# Patient Record
Sex: Female | Born: 1960 | Race: White | Hispanic: No | Marital: Married | Smoking: Current every day smoker
Health system: Southern US, Community
[De-identification: ages and names within clinical notes are randomized; demographics above are authoritative.]

## PROBLEM LIST (undated history)

## (undated) DIAGNOSIS — J45909 Unspecified asthma, uncomplicated: Secondary | ICD-10-CM

## (undated) DIAGNOSIS — J189 Pneumonia, unspecified organism: Secondary | ICD-10-CM

## (undated) DIAGNOSIS — R519 Headache, unspecified: Secondary | ICD-10-CM

## (undated) DIAGNOSIS — M549 Dorsalgia, unspecified: Secondary | ICD-10-CM

## (undated) DIAGNOSIS — E119 Type 2 diabetes mellitus without complications: Secondary | ICD-10-CM

## (undated) DIAGNOSIS — F32A Depression, unspecified: Secondary | ICD-10-CM

## (undated) DIAGNOSIS — G8929 Other chronic pain: Secondary | ICD-10-CM

## (undated) DIAGNOSIS — I219 Acute myocardial infarction, unspecified: Secondary | ICD-10-CM

## (undated) DIAGNOSIS — Z9889 Other specified postprocedural states: Secondary | ICD-10-CM

## (undated) DIAGNOSIS — R0600 Dyspnea, unspecified: Secondary | ICD-10-CM

## (undated) DIAGNOSIS — J449 Chronic obstructive pulmonary disease, unspecified: Secondary | ICD-10-CM

## (undated) DIAGNOSIS — F419 Anxiety disorder, unspecified: Secondary | ICD-10-CM

## (undated) DIAGNOSIS — F329 Major depressive disorder, single episode, unspecified: Secondary | ICD-10-CM

## (undated) DIAGNOSIS — E785 Hyperlipidemia, unspecified: Secondary | ICD-10-CM

## (undated) DIAGNOSIS — R112 Nausea with vomiting, unspecified: Secondary | ICD-10-CM

## (undated) DIAGNOSIS — I2510 Atherosclerotic heart disease of native coronary artery without angina pectoris: Secondary | ICD-10-CM

## (undated) DIAGNOSIS — I10 Essential (primary) hypertension: Secondary | ICD-10-CM

## (undated) HISTORY — DX: Essential (primary) hypertension: I10

## (undated) HISTORY — DX: Atherosclerotic heart disease of native coronary artery without angina pectoris: I25.10

## (undated) HISTORY — DX: Chronic obstructive pulmonary disease, unspecified: J44.9

## (undated) HISTORY — DX: Anxiety disorder, unspecified: F41.9

## (undated) HISTORY — DX: Hyperlipidemia, unspecified: E78.5

## (undated) HISTORY — PX: BREAST BIOPSY: SHX20

## (undated) HISTORY — DX: Depression, unspecified: F32.A

## (undated) HISTORY — PX: TOTAL VAGINAL HYSTERECTOMY: SHX2548

## (undated) HISTORY — DX: Other chronic pain: G89.29

## (undated) HISTORY — DX: Major depressive disorder, single episode, unspecified: F32.9

## (undated) HISTORY — DX: Type 2 diabetes mellitus without complications: E11.9

## (undated) HISTORY — DX: Dorsalgia, unspecified: M54.9

---

## 2001-05-11 ENCOUNTER — Emergency Department (HOSPITAL_COMMUNITY): Admission: EM | Admit: 2001-05-11 | Discharge: 2001-05-11 | Payer: Self-pay | Admitting: Emergency Medicine

## 2001-05-11 ENCOUNTER — Encounter: Payer: Self-pay | Admitting: Emergency Medicine

## 2001-12-16 ENCOUNTER — Ambulatory Visit (HOSPITAL_COMMUNITY): Admission: RE | Admit: 2001-12-16 | Discharge: 2001-12-16 | Payer: Self-pay | Admitting: Pulmonary Disease

## 2002-07-05 ENCOUNTER — Other Ambulatory Visit: Admission: RE | Admit: 2002-07-05 | Discharge: 2002-07-05 | Payer: Self-pay | Admitting: Obstetrics & Gynecology

## 2002-08-11 ENCOUNTER — Observation Stay (HOSPITAL_COMMUNITY): Admission: RE | Admit: 2002-08-11 | Discharge: 2002-08-12 | Payer: Self-pay | Admitting: Obstetrics & Gynecology

## 2003-11-29 ENCOUNTER — Ambulatory Visit (HOSPITAL_COMMUNITY): Admission: RE | Admit: 2003-11-29 | Discharge: 2003-11-29 | Payer: Self-pay | Admitting: Obstetrics & Gynecology

## 2004-01-24 ENCOUNTER — Ambulatory Visit (HOSPITAL_COMMUNITY): Admission: RE | Admit: 2004-01-24 | Discharge: 2004-01-24 | Payer: Self-pay | Admitting: Obstetrics & Gynecology

## 2004-04-03 ENCOUNTER — Ambulatory Visit (HOSPITAL_COMMUNITY): Admission: RE | Admit: 2004-04-03 | Discharge: 2004-04-03 | Payer: Self-pay | Admitting: Pulmonary Disease

## 2004-04-25 ENCOUNTER — Encounter (HOSPITAL_COMMUNITY): Admission: RE | Admit: 2004-04-25 | Discharge: 2004-04-26 | Payer: Self-pay

## 2006-04-21 ENCOUNTER — Ambulatory Visit (HOSPITAL_COMMUNITY): Admission: RE | Admit: 2006-04-21 | Discharge: 2006-04-21 | Payer: Self-pay | Admitting: Pulmonary Disease

## 2006-05-01 ENCOUNTER — Ambulatory Visit: Payer: Self-pay | Admitting: Internal Medicine

## 2007-09-27 ENCOUNTER — Ambulatory Visit (HOSPITAL_COMMUNITY): Admission: RE | Admit: 2007-09-27 | Discharge: 2007-09-27 | Payer: Self-pay | Admitting: Pulmonary Disease

## 2008-12-21 ENCOUNTER — Ambulatory Visit (HOSPITAL_COMMUNITY): Payer: Self-pay | Admitting: Psychiatry

## 2009-01-04 ENCOUNTER — Ambulatory Visit (HOSPITAL_COMMUNITY): Payer: Self-pay | Admitting: Psychiatry

## 2009-01-11 ENCOUNTER — Ambulatory Visit (HOSPITAL_COMMUNITY): Payer: Self-pay | Admitting: Psychiatry

## 2009-01-16 ENCOUNTER — Ambulatory Visit (HOSPITAL_COMMUNITY): Payer: Self-pay | Admitting: Psychiatry

## 2009-01-18 ENCOUNTER — Ambulatory Visit (HOSPITAL_COMMUNITY): Payer: Self-pay | Admitting: Psychiatry

## 2009-01-23 ENCOUNTER — Ambulatory Visit (HOSPITAL_COMMUNITY): Payer: Self-pay | Admitting: Psychiatry

## 2009-01-30 ENCOUNTER — Ambulatory Visit (HOSPITAL_COMMUNITY): Payer: Self-pay | Admitting: Psychiatry

## 2009-02-07 ENCOUNTER — Ambulatory Visit (HOSPITAL_COMMUNITY): Payer: Self-pay | Admitting: Psychiatry

## 2009-02-12 ENCOUNTER — Ambulatory Visit (HOSPITAL_COMMUNITY): Payer: Self-pay | Admitting: Psychiatry

## 2009-02-15 ENCOUNTER — Ambulatory Visit (HOSPITAL_COMMUNITY): Payer: Self-pay | Admitting: Psychiatry

## 2009-02-22 ENCOUNTER — Ambulatory Visit (HOSPITAL_COMMUNITY): Payer: Self-pay | Admitting: Psychiatry

## 2009-03-01 ENCOUNTER — Ambulatory Visit (HOSPITAL_COMMUNITY): Payer: Self-pay | Admitting: Psychiatry

## 2009-03-16 ENCOUNTER — Ambulatory Visit (HOSPITAL_COMMUNITY): Payer: Self-pay | Admitting: Psychiatry

## 2009-03-20 ENCOUNTER — Ambulatory Visit (HOSPITAL_COMMUNITY): Payer: Self-pay | Admitting: Psychiatry

## 2009-03-30 ENCOUNTER — Ambulatory Visit (HOSPITAL_COMMUNITY): Payer: Self-pay | Admitting: Psychiatry

## 2009-04-13 ENCOUNTER — Ambulatory Visit (HOSPITAL_COMMUNITY): Payer: Self-pay | Admitting: Psychiatry

## 2009-04-24 ENCOUNTER — Ambulatory Visit (HOSPITAL_COMMUNITY): Payer: Self-pay | Admitting: Psychiatry

## 2009-05-01 ENCOUNTER — Ambulatory Visit (HOSPITAL_COMMUNITY): Payer: Self-pay | Admitting: Psychiatry

## 2009-05-15 ENCOUNTER — Ambulatory Visit (HOSPITAL_COMMUNITY): Payer: Self-pay | Admitting: Psychiatry

## 2009-05-22 ENCOUNTER — Ambulatory Visit (HOSPITAL_COMMUNITY): Payer: Self-pay | Admitting: Psychiatry

## 2009-06-08 ENCOUNTER — Ambulatory Visit (HOSPITAL_COMMUNITY): Payer: Self-pay | Admitting: Psychiatry

## 2009-06-18 ENCOUNTER — Ambulatory Visit (HOSPITAL_COMMUNITY): Payer: Self-pay | Admitting: Psychiatry

## 2009-07-12 ENCOUNTER — Ambulatory Visit (HOSPITAL_COMMUNITY): Payer: Self-pay | Admitting: Psychiatry

## 2009-07-13 ENCOUNTER — Ambulatory Visit (HOSPITAL_COMMUNITY): Payer: Self-pay | Admitting: Psychiatry

## 2009-07-18 ENCOUNTER — Ambulatory Visit (HOSPITAL_COMMUNITY): Payer: Self-pay | Admitting: Psychiatry

## 2009-08-16 ENCOUNTER — Ambulatory Visit (HOSPITAL_COMMUNITY): Payer: Self-pay | Admitting: Psychiatry

## 2009-08-23 ENCOUNTER — Ambulatory Visit (HOSPITAL_COMMUNITY): Payer: Self-pay | Admitting: Psychiatry

## 2009-09-05 ENCOUNTER — Ambulatory Visit (HOSPITAL_COMMUNITY): Payer: Self-pay | Admitting: Psychiatry

## 2009-10-04 ENCOUNTER — Ambulatory Visit (HOSPITAL_COMMUNITY): Payer: Self-pay | Admitting: Psychiatry

## 2009-10-22 ENCOUNTER — Ambulatory Visit (HOSPITAL_COMMUNITY): Payer: Self-pay | Admitting: Psychiatry

## 2009-10-30 ENCOUNTER — Ambulatory Visit (HOSPITAL_COMMUNITY): Payer: Self-pay | Admitting: Psychiatry

## 2009-11-22 ENCOUNTER — Ambulatory Visit (HOSPITAL_COMMUNITY): Payer: Self-pay | Admitting: Psychiatry

## 2009-12-14 ENCOUNTER — Ambulatory Visit (HOSPITAL_COMMUNITY): Payer: Self-pay | Admitting: Psychiatry

## 2009-12-25 ENCOUNTER — Ambulatory Visit (HOSPITAL_COMMUNITY): Payer: Self-pay | Admitting: Psychiatry

## 2009-12-28 ENCOUNTER — Ambulatory Visit (HOSPITAL_COMMUNITY): Payer: Self-pay | Admitting: Psychiatry

## 2010-01-15 ENCOUNTER — Ambulatory Visit (HOSPITAL_COMMUNITY): Payer: Self-pay | Admitting: Psychiatry

## 2010-01-22 ENCOUNTER — Ambulatory Visit (HOSPITAL_COMMUNITY): Payer: Self-pay | Admitting: Psychiatry

## 2010-01-30 ENCOUNTER — Ambulatory Visit (HOSPITAL_COMMUNITY): Payer: Self-pay | Admitting: Psychiatry

## 2010-02-05 ENCOUNTER — Ambulatory Visit (HOSPITAL_COMMUNITY): Payer: Self-pay | Admitting: Psychiatry

## 2010-02-19 ENCOUNTER — Ambulatory Visit (HOSPITAL_COMMUNITY): Payer: Self-pay | Admitting: Psychiatry

## 2010-02-22 ENCOUNTER — Ambulatory Visit (HOSPITAL_COMMUNITY): Payer: Self-pay | Admitting: Psychiatry

## 2010-03-13 ENCOUNTER — Ambulatory Visit (HOSPITAL_COMMUNITY): Payer: Self-pay | Admitting: Psychiatry

## 2010-03-21 ENCOUNTER — Ambulatory Visit (HOSPITAL_COMMUNITY): Payer: Self-pay | Admitting: Psychiatry

## 2010-03-27 ENCOUNTER — Ambulatory Visit (HOSPITAL_COMMUNITY): Payer: Self-pay | Admitting: Psychiatry

## 2010-04-02 ENCOUNTER — Ambulatory Visit (HOSPITAL_COMMUNITY): Payer: Self-pay | Admitting: Psychiatry

## 2010-04-15 ENCOUNTER — Ambulatory Visit (HOSPITAL_COMMUNITY): Payer: Self-pay | Admitting: Psychiatry

## 2010-04-29 ENCOUNTER — Ambulatory Visit (HOSPITAL_COMMUNITY): Payer: Self-pay | Admitting: Psychiatry

## 2010-05-14 ENCOUNTER — Ambulatory Visit (HOSPITAL_COMMUNITY): Payer: Self-pay | Admitting: Psychiatry

## 2010-05-21 ENCOUNTER — Ambulatory Visit (HOSPITAL_COMMUNITY): Payer: Self-pay | Admitting: Psychiatry

## 2010-06-07 ENCOUNTER — Ambulatory Visit (HOSPITAL_COMMUNITY): Payer: Self-pay | Admitting: Psychiatry

## 2010-06-19 ENCOUNTER — Ambulatory Visit (HOSPITAL_COMMUNITY): Payer: Self-pay | Admitting: Psychiatry

## 2010-07-05 ENCOUNTER — Ambulatory Visit (HOSPITAL_COMMUNITY): Payer: Self-pay | Admitting: Psychiatry

## 2010-07-11 ENCOUNTER — Ambulatory Visit (HOSPITAL_COMMUNITY): Payer: Self-pay | Admitting: Psychiatry

## 2010-07-16 ENCOUNTER — Ambulatory Visit (HOSPITAL_COMMUNITY): Payer: Self-pay | Admitting: Psychiatry

## 2010-08-07 ENCOUNTER — Ambulatory Visit (HOSPITAL_COMMUNITY)
Admission: RE | Admit: 2010-08-07 | Discharge: 2010-08-07 | Payer: Self-pay | Source: Home / Self Care | Attending: Psychiatry | Admitting: Psychiatry

## 2010-08-24 ENCOUNTER — Encounter: Payer: Self-pay | Admitting: Internal Medicine

## 2010-08-25 ENCOUNTER — Encounter: Payer: Self-pay | Admitting: Pulmonary Disease

## 2010-08-28 ENCOUNTER — Ambulatory Visit (HOSPITAL_COMMUNITY)
Admission: RE | Admit: 2010-08-28 | Discharge: 2010-08-28 | Payer: Self-pay | Source: Home / Self Care | Attending: Psychiatry | Admitting: Psychiatry

## 2010-09-13 ENCOUNTER — Encounter (INDEPENDENT_AMBULATORY_CARE_PROVIDER_SITE_OTHER): Payer: Self-pay | Admitting: Psychiatry

## 2010-09-13 DIAGNOSIS — F329 Major depressive disorder, single episode, unspecified: Secondary | ICD-10-CM

## 2010-09-17 ENCOUNTER — Encounter (INDEPENDENT_AMBULATORY_CARE_PROVIDER_SITE_OTHER): Payer: Self-pay | Admitting: Psychiatry

## 2010-09-17 DIAGNOSIS — F331 Major depressive disorder, recurrent, moderate: Secondary | ICD-10-CM

## 2010-09-17 DIAGNOSIS — F3289 Other specified depressive episodes: Secondary | ICD-10-CM

## 2010-09-17 DIAGNOSIS — F329 Major depressive disorder, single episode, unspecified: Secondary | ICD-10-CM

## 2010-09-26 ENCOUNTER — Encounter (INDEPENDENT_AMBULATORY_CARE_PROVIDER_SITE_OTHER): Payer: Self-pay | Admitting: Psychiatry

## 2010-09-26 DIAGNOSIS — F329 Major depressive disorder, single episode, unspecified: Secondary | ICD-10-CM

## 2010-10-11 ENCOUNTER — Encounter (INDEPENDENT_AMBULATORY_CARE_PROVIDER_SITE_OTHER): Payer: Self-pay | Admitting: Psychiatry

## 2010-10-11 DIAGNOSIS — F329 Major depressive disorder, single episode, unspecified: Secondary | ICD-10-CM

## 2010-10-24 ENCOUNTER — Encounter (INDEPENDENT_AMBULATORY_CARE_PROVIDER_SITE_OTHER): Payer: Self-pay | Admitting: Psychiatry

## 2010-10-24 DIAGNOSIS — F329 Major depressive disorder, single episode, unspecified: Secondary | ICD-10-CM

## 2010-11-12 ENCOUNTER — Encounter (INDEPENDENT_AMBULATORY_CARE_PROVIDER_SITE_OTHER): Payer: Self-pay | Admitting: Psychiatry

## 2010-11-12 DIAGNOSIS — F329 Major depressive disorder, single episode, unspecified: Secondary | ICD-10-CM

## 2010-11-19 ENCOUNTER — Encounter (INDEPENDENT_AMBULATORY_CARE_PROVIDER_SITE_OTHER): Payer: Self-pay | Admitting: Psychiatry

## 2010-11-19 DIAGNOSIS — F331 Major depressive disorder, recurrent, moderate: Secondary | ICD-10-CM

## 2010-12-03 ENCOUNTER — Encounter (INDEPENDENT_AMBULATORY_CARE_PROVIDER_SITE_OTHER): Payer: Self-pay | Admitting: Psychiatry

## 2010-12-03 DIAGNOSIS — F329 Major depressive disorder, single episode, unspecified: Secondary | ICD-10-CM

## 2010-12-20 NOTE — Op Note (Signed)
NAME:  Lauren David, Lauren David                          ACCOUNT NO.:  [id number]   MEDICAL RECORD NO.:  [id number]                   PATIENT TYPE:  AMB   LOCATION:  DAY                                  FACILITY:  [facility]   PHYSICIAN:  Lazaro Arms, M.D.                DATE OF BIRTH:  July 13, 1961   DATE OF PROCEDURE:  08/11/2002  DATE OF DISCHARGE:                                 OPERATIVE REPORT   PREOPERATIVE DIAGNOSES:  1. Hypermenorrhea.  2. Endometrial hyperplasia.  3. Dysmenorrhea.  4. Left ovarian cyst.   POSTOPERATIVE DIAGNOSES:  1. Hypermenorrhea.  2. Endometrial hyperplasia.  3. Dysmenorrhea.  4. Left ovarian cyst.   PROCEDURES:  1. Total vaginal hysterectomy, bilateral salpingo-oophorectomy.  2. Removal of skin tags of the neck.   SURGEON:  Lazaro Arms, M.D.   ANESTHESIA:  General endotracheal.   FINDINGS:  The patient had extremely good support structures of the uterus.  She had no descent at all.  The bladder was well-supported, as was the  bladder neck.  The left ovary did indeed have a cyst, which was ruptured at  the time of removal.  It was overall enlarged as compared to the right.  Both tubes and ovaries appeared to be normal.  The uterus appeared to be  normal.   DESCRIPTION OF PROCEDURE:  The patient was taken to the operating room and  placed in the supine position, where she underwent general endotracheal  anesthesia.  She was then placed in dorsal lithotomy position and prepped  and draped in the usual sterile fashion.  A Foley catheter was placed.  A  weighted speculum was placed in the posterior vagina, and the cervix was  grasped with a thyroid tenaculum.  A Deaver was used to retract the bladder.  Marcaine 0.5% with 1:200,000 epinephrine was injected in a circumferential  fashion about the cervix.  The electrocautery unit was used to incise the  vagina, and it was pushed off the lower uterine segment both anteriorly and  posteriorly.  The posterior  cul-de-sac was entered with some difficulty  because again it was very high and there was no descent of the uterus.  The  uterosacral ligaments were clamped, cut, transfixed, and suture ligated and  held bilaterally.  The cardinal ligaments were clamped, cut, transfixed, and  suture ligated and cut.  It took several small pedicles to through the  cardinal.  Great care was taken to dissect the bladder peritoneum off the  lower uterine segment.  Initially I was too deep in the dissection and then  was able to locate the peritoneum and incised it.  I then plicated the  anterior and posterior leaves of the broad ligament and clamped the vessels.  These were clamped, cut, and suture ligated.  Serial pedicles taken up the  fundus, each pedicle being clamped, cut, and transfixed and suture ligated.  The utero-ovarian  ligament was crossclamped and double suture ligated  bilaterally.  Large hemoclips were used to prevent peritoneal slippage.  The  left infundibulopelvic ligament was then pulled down.  It was quite large,  and I could not get around it with one clamp.  As a result, I clamped it  from above and below and it had some peritoneal slippage.  I used the  hemoclips,  suture ligation was performed in a fore-and-aft fashion, but  there was some area in the middle that was bleeding and I used hemoclips and  additional suturing to get it to stop.  It was hemostatic and held.  The  infundibulopelvic ligament on the right was crossclamped, cut, and suture  ligated with good hemostasis.  The pelvis was irrigated vigorously.  All  pedicles were found to be hemostatic.  Because of difficulty dissecting the  bladder, I did put 420 cubic centimeters of sterile milk in the bladder, and  there was no leakage.  The peritoneum was closed in a pursestring fashion.  The vagina was closed anterior to posterior from side to side without  difficulty.  There was a uterosacral plication performed.  There was   extremely good support of the bladder neck, the bladder, and the vaginal  apex at this point.  I then removed skin tags from the neck sharply with  scissors.  Silver nitrate was placed in these areas.  The patient tolerated  the procedure well.  She experienced 100 cubic centimeters of blood loss.  She was taken to recovery in good, stable condition.  All counts were  correct x3.  She received Levaquin prophylactically.                                                Lazaro Arms, M.D.    [name]  D:  08/11/2002  T:  08/11/2002  Job:  [id number]

## 2010-12-20 NOTE — Procedures (Signed)
Lauren David, Lauren David                ACCOUNT NO.:  [id number]   MEDICAL RECORD NO.:  [id number]          PATIENT TYPE:  OUT   LOCATION:  RAD                           FACILITY:  [facility]   PHYSICIAN:  Edward L. Juanetta Gosling, M.D.DATE OF BIRTH:  07/19/1961   DATE OF PROCEDURE:  DATE OF DISCHARGE:  04/03/2004                                    STRESS TEST   INDICATIONS FOR PROCEDURE:  Chest discomfort.   SUBJECTIVE:  [name] has been having chest discomfort and also has some  hoarseness.  She has shortness of breath and edema.  Concern is to rule out  ischemic cardiac disease.  There are no contraindications to cardiac stress  testing.   [name] exercised for 6 minutes and 20 seconds on the Bruce protocol  reaching and sustaining 7 mets.  Her maximum recorded heart rate was 155,  which is 88% of her age predicted maximal heart rate.  Her blood pressure  response to exercise was normal.  Cardiolite was injected per routine.  She  had no chest discomfort with exercise, but did have shortness of breath.  There were no electrocardiographic changes suggestive of inducible ischemia.   IMPRESSION:  1.  No evidence of inducible ischemia.  2.  Normal blood pressure response to exercise.  3.  Shortness of breath, but no chest discomfort with exercise.  4.  Good exercise tolerance.  5.  Cardiolite images are pending.      ELH/MEDQ  D:  04/25/2004  T:  04/25/2004  Job:  [id number]

## 2010-12-20 NOTE — H&P (Signed)
   NAME:  Lauren David, Lauren David                          ACCOUNT NO.:  [id number]   MEDICAL RECORD NO.:  [id number]                   PATIENT TYPE:  AMB   LOCATION:  DAY                                  FACILITY:  [facility]   PHYSICIAN:  Lazaro Arms, M.D.                DATE OF BIRTH:  09/03/1960   DATE OF ADMISSION:  DATE OF DISCHARGE:                                HISTORY & PHYSICAL   HISTORY OF PRESENT ILLNESS:  The patient is a 50 year old white female,  gravida 3, para 2, abortus 1 with last menstrual period in early December.  Pretty much stopped her periods using Megace therapy. She was bleeding since  9/19, and she bleed every day since then, variable amounts, sometimes heavy,  sometimes spotting, with lots of clotting bright red-size of plums. Her  cramping is a normal amount. She was having to wear tampons and pads  together. Had endometrial biopsy which showed endometrial hyperplasia. Since  then, I have kept her on Megace. She has also had recurrent ovarian cysts,  the most recent ones on the left side and is about 4 cm. Due to her long  history of heavy irregular bleeding, endometrial hyperplasia, and ovarian  cyst, she is admitted for TVH/BSO.   PAST MEDICAL HISTORY:  Negative.   PAST SURGICAL HISTORY:  Negative.   PAST OBSTETRIC HISTORY:  Vaginal delivery x2 and one termination.   ALLERGIES:  Percocet.   MEDICATIONS:  Xanax as needed and albuterol as needed.   FAMILY HISTORY:  Otherwise negative.   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   PHYSICAL EXAMINATION:  Her blood pressure is 120/80.  HEENT:  Unremarkable.  NECK:  Thyroid is normal.  LUNGS:  Clear.  HEART:  Regular, rate, and rhythm without murmurs, rubs, or gallops.  BREASTS:  Without masses, discharge, or skin changes.  ABDOMEN:  Benign. No hepatosplenomegaly or masses.  GENITALIA:  Has normal external genitalia. __________ without discharge.  Cervix parous without lesions. Uterus normal  size, shape, and contour.  Ovaries normal, nontender. The left ovary is slightly enlarged.  EXTREMITIES:  Warm with no edema.  NEUROLOGICAL:  Grossly intact.   IMPRESSION:  1. Menometorrhagia.  2. Endometrial hyperplasia.  3. Left ovarian cyst.   PLAN:  The patient is admitted for TVH/BSO. She understands the risks,  benefits, indications, and alternatives and will proceed.                                               Lazaro Arms, M.D.    [name]  D:  08/10/2002  T:  08/10/2002  Job:  [id number]

## 2010-12-20 NOTE — Procedures (Signed)
NAME:  Lauren David, Lauren David                          ACCOUNT NO.:  [id number]   MEDICAL RECORD NO.:  [id number]                   PATIENT TYPE:  OUT   LOCATION:  RAD                                  FACILITY:  [facility]   PHYSICIAN:  Hawkeye Bing, M.D.               DATE OF BIRTH:  [birth year]   DATE OF PROCEDURE:  04/03/2004  DATE OF DISCHARGE:                                  ECHOCARDIOGRAM   REFERRING:  Dr. Ramon Dredge L. Hawkins.   CLINICAL DATA:  [age]-year-old woman with dyspnea and chest discomfort.   M-MODE:  Aorta 2.9.  Left atrium 3.9.  Septum 0.8.  Posterior wall 0.8.  LV  diastole 5.2.  LV systole 3.1.   IMPRESSION:  1.  Technically suboptimal but adequate echocardiographic study.  2.  Normal left and right atrial size; normal right ventricular size and      function; borderline right ventricular hypertrophy.  3.  Normal trileaflet aortic valve.  4.  Slight mitral valve thickening; minimal mitral annular calcification.  5.  Tricuspid valve not well-seen -- grossly normal.  6.  Pulmonic valve not adequately imaged.  7.  Normal internal diameter, wall thickness, regional and global function      of the left ventricle.      ___________________________________________                                            Prescott Bing, M.D.   RR/MEDQ  D:  04/03/2004  T:  04/03/2004  Job:  [id number]

## 2010-12-20 NOTE — Discharge Summary (Signed)
   NAME:  Lauren David, Lauren David                          ACCOUNT NO.:  [id number]   MEDICAL RECORD NO.:  [id number]                   PATIENT TYPE:  OBV   LOCATION:  A426                                 FACILITY:  [facility]   PHYSICIAN:  Lazaro Arms, M.D.                DATE OF BIRTH:  17-Apr-1961   DATE OF ADMISSION:  08/11/2002  DATE OF DISCHARGE:  08/12/2002                                 DISCHARGE SUMMARY   DISCHARGE DIAGNOSES:  1. Status post a total vaginal hysterectomy/bilateral salpingo-oophorectomy.  2. Unremarkable postoperative course.   PROCEDURE:  Total vaginal hysterectomy/bilateral salpingo-oophorectomy.   HISTORY AND PHYSICAL:  Please refer to the transcribed history and physical  and the operative note for details of admission to the hospital.   HOSPITAL COURSE:  The patient was admitted postoperatively.  She had an  unremarkable postoperative course.  She started clear liquids and a regular  diet.  Voided without symptoms and was ambulatory.  Remained afebrile.  Hemoglobin and hematocrit were 12 and 34.  She was discharged to home in the  morning in good and stable condition.  Followup in the office next week as  scheduled.  She was given a prescription for Lortab elixir for pain,  Toradol, Levaquin, Vivelle-Dot patches, and Ambien.  If she has any  questions between now and then, she is given instructions and precautions.                                               Lazaro Arms, M.D.    [name]  D:  08/12/2002  T:  08/12/2002  Job:  [id number]

## 2010-12-20 NOTE — Assessment & Plan Note (Signed)
[facility]                              CARDIOLOGY OFFICE NOTE   Lauren, David                       MRN:          [id number]  DATE:05/01/2006                            DOB:          1960-08-05    IDENTIFICATION:  Lauren David is 50 year old woman who was last seen in  Cardiology clinic back in 2000 for palpitations. She comes in today for  evaluation of chest pressure.   HISTORY OF PRESENT ILLNESS:  The patient notes that within the past 2-3  weeks, episodes of left sided chest pressure that occur with and without  activity and go away on their own and are not accompanied by any shortness  of breath. She says they are heaviness sensation. She was by Dr. Juanetta Gosling  and placed on inhaler and then was sent here.   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   ALLERGIES:  TYLOX - leading to nausea and itching.   PAST MEDICAL HISTORY:  Palpitations.   SOCIAL HISTORY:  The patient is married, note her husband a week and a half  ago had chest pain, ruled in for a myocardial infarction and had a 100%  occlusion of 1 vessel and two 80% lesions in other vessels and just home  from the hospital. She continues to smoke a couple of packs of cigarettes  per day for the past 30 years and does not drink alcohol.   FAMILY HISTORY:  Father died of liver cancer and had diabetes. One brother  has diabetes.   REVIEW OF SYSTEMS:  Patient notes mild wheezing. The last episode of chest  pain was the 19th and she had it in bed. Otherwise, all systems reviewed  were negative to the above problem except as noted.   PHYSICAL EXAMINATION:  GENERAL: Patient is no distress.  VITAL SIGNS: Blood pressure 118/70, pulse 88 and regular, weight 215.  NECK: No bruits.  LUNGS: Clear with some wheezing on forced expiration.  CARDIAC: Regular rate and rhythm, S1, S2 no S3. Grade 1-2 over 6 systolic  murmur heard best at the base.  ABDOMEN: Benign.  EXTREMITIES: No edema. 2+ pulses.   12 lead echocardiogram, normal sinus rhythm, 88 beats-per-minute. No  specific ST changes.   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 She does not  know her cholesterol, does not see doctor on a regular basis.   Her chest symptoms are somewhat atypical as she is very active and activity  does not exacerbate them. She is wheezing on exam, question if some of this  is related.   I have switched her extra inhaler to Advair given that she is wheezing now.  She will take this twice a day. We will set her up for stress Myoview. Note  she had one done in 2005 that was normal. I will ask her to take a baby  aspirin until the test is evaluated.   I will give her some samples of  Nexium empirically to see if there is any  gastrointestinal component to the symptoms. Otherwise, I will see her after  the testing is done.   She was counseled on smoking cessation, given a prescription for Chantix and  she is eager to try this given that her husband is now on it, also given web  site information for the [employer], where she can look up  the recipes and study different lipids.            ______________________________  Pricilla Riffle, MD, [facility]     PVR/MedQ  DD:  05/01/2006  DT:  05/04/2006  Job #:  [id number]   cc:   Ramon Dredge L. Juanetta Gosling, M.D.

## 2010-12-24 ENCOUNTER — Encounter (INDEPENDENT_AMBULATORY_CARE_PROVIDER_SITE_OTHER): Payer: Self-pay | Admitting: Psychiatry

## 2010-12-24 DIAGNOSIS — F329 Major depressive disorder, single episode, unspecified: Secondary | ICD-10-CM

## 2011-01-14 ENCOUNTER — Encounter (HOSPITAL_COMMUNITY): Payer: Self-pay | Admitting: Psychiatry

## 2011-01-21 ENCOUNTER — Encounter (INDEPENDENT_AMBULATORY_CARE_PROVIDER_SITE_OTHER): Payer: Self-pay | Admitting: Psychiatry

## 2011-01-21 DIAGNOSIS — F329 Major depressive disorder, single episode, unspecified: Secondary | ICD-10-CM

## 2011-01-30 ENCOUNTER — Encounter (INDEPENDENT_AMBULATORY_CARE_PROVIDER_SITE_OTHER): Payer: Self-pay | Admitting: Psychiatry

## 2011-01-30 DIAGNOSIS — F329 Major depressive disorder, single episode, unspecified: Secondary | ICD-10-CM

## 2011-02-26 ENCOUNTER — Encounter (INDEPENDENT_AMBULATORY_CARE_PROVIDER_SITE_OTHER): Payer: Self-pay | Admitting: Psychiatry

## 2011-02-26 DIAGNOSIS — F329 Major depressive disorder, single episode, unspecified: Secondary | ICD-10-CM

## 2011-03-24 ENCOUNTER — Encounter (INDEPENDENT_AMBULATORY_CARE_PROVIDER_SITE_OTHER): Payer: Self-pay | Admitting: Psychiatry

## 2011-03-24 DIAGNOSIS — F329 Major depressive disorder, single episode, unspecified: Secondary | ICD-10-CM

## 2011-04-21 ENCOUNTER — Encounter (HOSPITAL_COMMUNITY): Payer: Self-pay | Admitting: Psychiatry

## 2011-04-24 ENCOUNTER — Encounter (HOSPITAL_COMMUNITY): Payer: Self-pay | Admitting: Psychiatry

## 2011-07-07 ENCOUNTER — Other Ambulatory Visit (HOSPITAL_COMMUNITY): Payer: Self-pay | Admitting: Psychiatry

## 2014-08-04 HISTORY — PX: BREAST BIOPSY: SHX20

## 2014-08-15 ENCOUNTER — Other Ambulatory Visit (HOSPITAL_COMMUNITY): Payer: Self-pay | Admitting: Pulmonary Disease

## 2014-08-15 DIAGNOSIS — Z780 Asymptomatic menopausal state: Secondary | ICD-10-CM

## 2014-08-15 DIAGNOSIS — N6320 Unspecified lump in the left breast, unspecified quadrant: Secondary | ICD-10-CM

## 2014-08-16 ENCOUNTER — Encounter (INDEPENDENT_AMBULATORY_CARE_PROVIDER_SITE_OTHER): Payer: Self-pay | Admitting: *Deleted

## 2014-08-22 ENCOUNTER — Ambulatory Visit (HOSPITAL_COMMUNITY)
Admission: RE | Admit: 2014-08-22 | Discharge: 2014-08-22 | Disposition: A | Discharge status: Home / Self Care | Payer: BLUE CROSS/BLUE SHIELD | Source: Ambulatory Visit | Attending: Pulmonary Disease | Admitting: Pulmonary Disease

## 2014-08-22 ENCOUNTER — Encounter (HOSPITAL_COMMUNITY): Payer: Self-pay

## 2014-08-22 ENCOUNTER — Ambulatory Visit (HOSPITAL_COMMUNITY): Payer: Self-pay

## 2014-08-22 ENCOUNTER — Other Ambulatory Visit (HOSPITAL_COMMUNITY): Payer: Self-pay | Admitting: Pulmonary Disease

## 2014-08-22 DIAGNOSIS — Z780 Asymptomatic menopausal state: Secondary | ICD-10-CM | POA: Insufficient documentation

## 2014-08-22 DIAGNOSIS — F17200 Nicotine dependence, unspecified, uncomplicated: Secondary | ICD-10-CM

## 2014-08-22 DIAGNOSIS — M8580 Other specified disorders of bone density and structure, unspecified site: Secondary | ICD-10-CM | POA: Insufficient documentation

## 2014-08-22 DIAGNOSIS — Z13820 Encounter for screening for osteoporosis: Secondary | ICD-10-CM | POA: Insufficient documentation

## 2014-08-22 DIAGNOSIS — Z720 Tobacco use: Secondary | ICD-10-CM | POA: Diagnosis present

## 2014-09-05 ENCOUNTER — Encounter (HOSPITAL_COMMUNITY): Payer: Self-pay

## 2014-11-14 ENCOUNTER — Other Ambulatory Visit (HOSPITAL_COMMUNITY): Payer: Self-pay | Admitting: Pulmonary Disease

## 2014-11-14 DIAGNOSIS — Z1231 Encounter for screening mammogram for malignant neoplasm of breast: Secondary | ICD-10-CM

## 2014-11-23 ENCOUNTER — Ambulatory Visit (HOSPITAL_COMMUNITY): Payer: BLUE CROSS/BLUE SHIELD

## 2014-12-19 ENCOUNTER — Other Ambulatory Visit (HOSPITAL_COMMUNITY): Payer: Self-pay | Admitting: Pulmonary Disease

## 2014-12-19 ENCOUNTER — Ambulatory Visit (HOSPITAL_COMMUNITY)
Admission: RE | Admit: 2014-12-19 | Discharge: 2014-12-19 | Disposition: A | Discharge status: Home / Self Care | Payer: Medicaid Other | Source: Ambulatory Visit | Attending: Pulmonary Disease | Admitting: Pulmonary Disease

## 2014-12-19 DIAGNOSIS — N6320 Unspecified lump in the left breast, unspecified quadrant: Secondary | ICD-10-CM

## 2014-12-19 DIAGNOSIS — Z09 Encounter for follow-up examination after completed treatment for conditions other than malignant neoplasm: Secondary | ICD-10-CM | POA: Diagnosis not present

## 2014-12-19 DIAGNOSIS — N63 Unspecified lump in breast: Secondary | ICD-10-CM | POA: Diagnosis present

## 2014-12-19 DIAGNOSIS — R921 Mammographic calcification found on diagnostic imaging of breast: Secondary | ICD-10-CM

## 2015-01-11 ENCOUNTER — Other Ambulatory Visit (HOSPITAL_COMMUNITY): Payer: Self-pay | Admitting: Pulmonary Disease

## 2015-01-11 DIAGNOSIS — R921 Mammographic calcification found on diagnostic imaging of breast: Secondary | ICD-10-CM

## 2015-01-18 ENCOUNTER — Ambulatory Visit
Admission: RE | Admit: 2015-01-18 | Discharge: 2015-01-18 | Disposition: A | Discharge status: Home / Self Care | Payer: Medicaid Other | Source: Ambulatory Visit | Attending: Pulmonary Disease | Admitting: Pulmonary Disease

## 2015-01-18 DIAGNOSIS — R921 Mammographic calcification found on diagnostic imaging of breast: Secondary | ICD-10-CM

## 2015-02-21 ENCOUNTER — Ambulatory Visit (HOSPITAL_COMMUNITY)
Admission: RE | Admit: 2015-02-21 | Discharge: 2015-02-21 | Disposition: A | Discharge status: Home / Self Care | Payer: Medicaid Other | Source: Ambulatory Visit | Attending: Pulmonary Disease | Admitting: Pulmonary Disease

## 2015-02-21 ENCOUNTER — Other Ambulatory Visit (HOSPITAL_COMMUNITY): Payer: Self-pay | Admitting: Pulmonary Disease

## 2015-02-21 DIAGNOSIS — M545 Low back pain, unspecified: Secondary | ICD-10-CM

## 2015-02-21 DIAGNOSIS — R102 Pelvic and perineal pain: Secondary | ICD-10-CM | POA: Diagnosis not present

## 2018-02-11 ENCOUNTER — Ambulatory Visit (HOSPITAL_COMMUNITY)
Admission: RE | Admit: 2018-02-11 | Discharge: 2018-02-11 | Disposition: A | Discharge status: Home / Self Care | Payer: Medicaid Other | Source: Ambulatory Visit | Attending: Pulmonary Disease | Admitting: Pulmonary Disease

## 2018-02-11 ENCOUNTER — Other Ambulatory Visit (HOSPITAL_COMMUNITY): Payer: Self-pay | Admitting: Pulmonary Disease

## 2018-02-11 DIAGNOSIS — I700 Atherosclerosis of aorta: Secondary | ICD-10-CM | POA: Diagnosis not present

## 2018-02-11 DIAGNOSIS — J449 Chronic obstructive pulmonary disease, unspecified: Secondary | ICD-10-CM

## 2018-02-12 ENCOUNTER — Encounter (INDEPENDENT_AMBULATORY_CARE_PROVIDER_SITE_OTHER): Payer: Self-pay | Admitting: *Deleted

## 2018-02-16 ENCOUNTER — Other Ambulatory Visit (HOSPITAL_COMMUNITY): Payer: Self-pay | Admitting: Respiratory Therapy

## 2018-02-16 DIAGNOSIS — R0602 Shortness of breath: Secondary | ICD-10-CM

## 2018-02-26 ENCOUNTER — Other Ambulatory Visit (HOSPITAL_COMMUNITY): Payer: Self-pay | Admitting: Pulmonary Disease

## 2018-02-26 DIAGNOSIS — R519 Headache, unspecified: Secondary | ICD-10-CM

## 2018-02-26 DIAGNOSIS — R51 Headache: Principal | ICD-10-CM

## 2018-03-04 ENCOUNTER — Ambulatory Visit (HOSPITAL_COMMUNITY)
Admission: RE | Admit: 2018-03-04 | Discharge: 2018-03-04 | Disposition: A | Discharge status: Home / Self Care | Payer: Medicaid Other | Source: Ambulatory Visit | Attending: Pulmonary Disease | Admitting: Pulmonary Disease

## 2018-03-04 DIAGNOSIS — R0602 Shortness of breath: Secondary | ICD-10-CM | POA: Insufficient documentation

## 2018-03-04 DIAGNOSIS — J449 Chronic obstructive pulmonary disease, unspecified: Secondary | ICD-10-CM | POA: Diagnosis not present

## 2018-03-04 LAB — PULMONARY FUNCTION TEST
DL/VA % pred: 125 %
DL/VA: 5.53 ml/min/mmHg/L
DLCO UNC % PRED: 84 %
DLCO UNC: 17.12 ml/min/mmHg
FEF 25-75 PRE: 0.88 L/s
FEF 25-75 Post: 0.8 L/sec
FEF2575-%CHANGE-POST: -8 %
FEF2575-%PRED-POST: 35 %
FEF2575-%PRED-PRE: 39 %
FEV1-%Change-Post: 0 %
FEV1-%PRED-POST: 53 %
FEV1-%Pred-Pre: 53 %
FEV1-POST: 1.24 L
FEV1-Pre: 1.24 L
FEV1FVC-%Change-Post: -1 %
FEV1FVC-%PRED-PRE: 89 %
FEV6-%CHANGE-POST: 1 %
FEV6-%PRED-POST: 62 %
FEV6-%Pred-Pre: 61 %
FEV6-POST: 1.8 L
FEV6-Pre: 1.78 L
FEV6FVC-%PRED-POST: 103 %
FEV6FVC-%Pred-Pre: 103 %
FVC-%Change-Post: 1 %
FVC-%PRED-POST: 60 %
FVC-%Pred-Pre: 59 %
FVC-Post: 1.8 L
FVC-Pre: 1.78 L
POST FEV1/FVC RATIO: 69 %
PRE FEV1/FVC RATIO: 70 %
Post FEV6/FVC ratio: 100 %
Pre FEV6/FVC Ratio: 100 %
RV % PRED: 246 %
RV: 4.46 L
TLC % pred: 136 %
TLC: 6.29 L

## 2018-03-04 MED ORDER — ALBUTEROL SULFATE (2.5 MG/3ML) 0.083% IN NEBU
2.5000 mg | INHALATION_SOLUTION | Freq: Once | RESPIRATORY_TRACT | Status: AC
  Administered 2018-03-04: 2.5 mg via RESPIRATORY_TRACT

## 2018-03-05 ENCOUNTER — Ambulatory Visit (HOSPITAL_COMMUNITY)
Admission: RE | Admit: 2018-03-05 | Discharge: 2018-03-05 | Disposition: A | Discharge status: Home / Self Care | Payer: Medicaid Other | Source: Ambulatory Visit | Attending: Pulmonary Disease | Admitting: Pulmonary Disease

## 2018-03-05 DIAGNOSIS — J322 Chronic ethmoidal sinusitis: Secondary | ICD-10-CM | POA: Diagnosis not present

## 2018-03-05 DIAGNOSIS — R51 Headache: Secondary | ICD-10-CM | POA: Diagnosis present

## 2018-03-05 DIAGNOSIS — R519 Headache, unspecified: Secondary | ICD-10-CM

## 2018-03-18 ENCOUNTER — Encounter (INDEPENDENT_AMBULATORY_CARE_PROVIDER_SITE_OTHER): Payer: Self-pay | Admitting: Internal Medicine

## 2018-03-18 ENCOUNTER — Encounter (INDEPENDENT_AMBULATORY_CARE_PROVIDER_SITE_OTHER): Payer: Self-pay | Admitting: *Deleted

## 2018-03-18 ENCOUNTER — Ambulatory Visit (INDEPENDENT_AMBULATORY_CARE_PROVIDER_SITE_OTHER): Payer: Medicaid Other | Admitting: Internal Medicine

## 2018-03-18 ENCOUNTER — Telehealth (INDEPENDENT_AMBULATORY_CARE_PROVIDER_SITE_OTHER): Payer: Self-pay | Admitting: *Deleted

## 2018-03-18 VITALS — BP 154/80 | HR 70 | Temp 98.0°F | Ht 61.0 in | Wt 176.1 lb

## 2018-03-18 DIAGNOSIS — K625 Hemorrhage of anus and rectum: Secondary | ICD-10-CM

## 2018-03-18 MED ORDER — SUPREP BOWEL PREP KIT 17.5-3.13-1.6 GM/177ML PO SOLN: 1.0000 | Freq: Once | ORAL | 0 refills | Status: AC

## 2018-03-18 NOTE — Telephone Encounter (Signed)
Patient needs suprep 

## 2018-03-18 NOTE — Patient Instructions (Signed)
The risks of bleeding, perforation and infection were reviewed with patient.  

## 2018-03-18 NOTE — Progress Notes (Signed)
   Subjective:    Patient ID: Lauren David, female    DOB: [birth year], 59 y.o.   MRN: [id number]  HPI Referred by Dr Luan Pulling for bloody stools/colonoscopy.  She has been having bloody stools x 2-3 months. Last incidence was yesterday. Occurring maybe once a week. Sees blood in the toilet tissue. Not related to constipation. No pain with defecation. Maternal grandfather had colon cancer in his 61s. Her appetite is good. Weight loss 30-40 pounds over the past 6 months which was unintentional. She just found out she was a diabetic.  Stools are brown in color. Normal caliber.  02/01/2018 H and H 15.8 and 46.4     Review of Systems Past Medical History:  Diagnosis Date  . COPD (chronic obstructive pulmonary disease) (HCC)    moderate  . Diabetes ([place])   . High cholesterol       Allergies  Allergen Reactions  . Percocet [Oxycodone-Acetaminophen]     rash    Current Outpatient Medications on File Prior to Visit  Medication Sig Dispense Refill  . albuterol (ACCUNEB) 0.63 MG/3ML nebulizer solution Take 1 ampule by nebulization every 6 (six) hours as needed for wheezing.    Marland Kitchen lisinopril (PRINIVIL,ZESTRIL) 5 MG tablet Take 5 mg by mouth daily.    . metFORMIN (GLUCOPHAGE) 500 MG tablet Take by mouth 2 (two) times daily with a meal.    . pravastatin (PRAVACHOL) 40 MG tablet Take 40 mg by mouth daily.    Marland Kitchen tiotropium (SPIRIVA) 18 MCG inhalation capsule Place 18 mcg into inhaler and inhale daily.     No current facility-administered medications on file prior to visit.         Objective:   Physical Exam Blood pressure (!) 154/80, pulse 70, temperature 98 F (36.7 C), height 5\' 1"  (1.549 m), weight 176 lb 1.6 oz (79.9 kg). Alert and oriented. Skin warm and dry. Oral mucosa is moist.   . Sclera anicteric, conjunctivae is pink. Thyroid not enlarged. No cervical lymphadenopathy. Lungs clear. Heart regular rate and rhythm.  Abdomen is soft. Bowel sounds are positive. No hepatomegaly. No  abdominal masses felt. No tenderness.  No edema to lower extremities.          Assessment & Plan:  Rectal bleeding. Colonic neoplasm needs to be ruled out. Polyp, ulcer, hemorrhoids needs to be ruled out.

## 2018-03-22 ENCOUNTER — Ambulatory Visit (INDEPENDENT_AMBULATORY_CARE_PROVIDER_SITE_OTHER): Payer: Medicaid Other | Admitting: Cardiology

## 2018-03-22 ENCOUNTER — Encounter: Payer: Self-pay | Admitting: Cardiology

## 2018-03-22 VITALS — BP 138/64 | HR 93 | Ht 61.0 in | Wt 177.0 lb

## 2018-03-22 DIAGNOSIS — R079 Chest pain, unspecified: Secondary | ICD-10-CM | POA: Diagnosis not present

## 2018-03-22 DIAGNOSIS — R0609 Other forms of dyspnea: Secondary | ICD-10-CM | POA: Diagnosis not present

## 2018-03-22 DIAGNOSIS — E1165 Type 2 diabetes mellitus with hyperglycemia: Secondary | ICD-10-CM

## 2018-03-22 DIAGNOSIS — Z720 Tobacco use: Secondary | ICD-10-CM

## 2018-03-22 DIAGNOSIS — I700 Atherosclerosis of aorta: Secondary | ICD-10-CM | POA: Diagnosis not present

## 2018-03-22 DIAGNOSIS — E782 Mixed hyperlipidemia: Secondary | ICD-10-CM

## 2018-03-22 DIAGNOSIS — I10 Essential (primary) hypertension: Secondary | ICD-10-CM

## 2018-03-22 MED ORDER — VARENICLINE TARTRATE 0.5 MG X 11 & 1 MG X 42 PO MISC: ORAL | 0 refills | Status: DC

## 2018-03-22 NOTE — Progress Notes (Signed)
Cardiology Office Note  Date: 03/22/2018   ID: Janelle, Spellman 1961-03-06, MRN [id number]  PCP: Sinda Du, MD  Consulting Cardiologist: Rozann Lesches, MD   Chief Complaint  Patient presents with  . Cardiac evaluation     History of Present Illness: Lauren David is a 57 y.o. female referred for cardiology consultation by Dr. Luan Pulling due to increased cardiac risk factors and aortic atherosclerosis documented by chest x-ray.  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  She describes dyspnea on exertion with known history of COPD and ongoing tobacco use, but feels like it has gotten worse in the last 6 months.  She has also had intermittent exertional chest pain.  Also describes a more atypical discomfort in her lower sternal area that is not clearly exertional.  Cardiac risk factors include ongoing tobacco abuse, uncontrolled type 2 diabetes mellitus, hypertension, and hyperlipidemia.  She admits that she has not been completely compliant with treatment but states that she is more focused at this time.  She also would like to try Chantix.  I personally reviewed her ECG today which shows a sinus rhythm with low voltage.  Today we discussed arranging stress testing for objective ischemic evaluation.  I also reviewed with her the results of her chest x-ray.  Past Medical History:  Diagnosis Date  . Anxiety   . Chronic back pain   . COPD (chronic obstructive pulmonary disease) ([place])   . Depression   . Essential hypertension   . Hyperlipidemia   . Type 2 diabetes mellitus ([place])     Past Surgical History:  Procedure Laterality Date  . TOTAL VAGINAL HYSTERECTOMY      Current Outpatient Medications  Medication Sig Dispense Refill  . albuterol (ACCUNEB) 0.63 MG/3ML nebulizer solution Take 1 ampule by nebulization every 6 (six) hours as needed for wheezing.    Marland Kitchen lisinopril (PRINIVIL,ZESTRIL) 5 MG tablet Take 5 mg by mouth  daily.    . metFORMIN (GLUCOPHAGE) 500 MG tablet Take by mouth 2 (two) times daily with a meal.    . pravastatin (PRAVACHOL) 40 MG tablet Take 40 mg by mouth daily.    Marland Kitchen tiotropium (SPIRIVA) 18 MCG inhalation capsule Place 18 mcg into inhaler and inhale daily.    . varenicline (CHANTIX STARTING MONTH PAK) 0.5 MG X 11 & 1 MG X 42 tablet Take one 0.5 mg tablet by mouth once daily for 3 days, then increase to one 0.5 mg tablet twice daily for 4 days, then increase to one 1 mg tablet twice daily. 53 tablet 0   No current facility-administered medications for this visit.    Allergies:  Percocet [oxycodone-acetaminophen]   Social History: The patient  reports that she has been smoking cigarettes. She has never used smokeless tobacco. She reports that she does not drink alcohol or use drugs.   Family History: The patient's family history includes Hypertension in her father.   ROS:  Please see the history of present illness. Otherwise, complete review of systems is positive for none.  All other systems are reviewed and negative.   Physical Exam: VS:  BP 138/64   Pulse 93   Ht 5\' 1"  (1.549 m)   Wt 177 lb (80.3 kg)   SpO2 96%   BMI 33.44 kg/m , BMI Body mass index is 33.44 kg/m.  Wt Readings from Last 3 Encounters:  03/22/18 177 lb (80.3 kg)  03/18/18 176 lb 1.6 oz (  79.9 kg)    General: Patient appears comfortable at rest. HEENT: Conjunctiva and lids normal, oropharynx clear. Neck: Supple, no elevated JVP or carotid bruits, no thyromegaly. Lungs: Clear to auscultation, nonlabored breathing at rest. Cardiac: Regular rate and rhythm, no S3 or significant systolic murmur. Abdomen: Soft, nontender, bowel sounds present. Extremities: No pitting edema, distal pulses 2+. Skin: Warm and dry. Musculoskeletal: No kyphosis. Neuropsychiatric: Alert and oriented x3, affect grossly appropriate.  ECG: There is no old tracing available today for review.  Recent Labwork:  July 2019: Glucose 314,  BUN 9, creatinine 0.60, potassium 4.3, AST 37, ALT 41, hemoglobin A1c 11.4, hemoglobin 15.8, platelets 273  Other Studies Reviewed Today:  Chest x-ray 02/11/2018: FINDINGS: Cardiomediastinal silhouette is normal. Mediastinal contours appear intact. Calcific atherosclerotic disease of the aorta.  There is no evidence of focal airspace consolidation, pleural effusion or pneumothorax.  Osseous structures are without acute abnormality. Soft tissues are grossly normal.  IMPRESSION: No active cardiopulmonary disease.  Calcific atherosclerotic disease of the aorta.  Assessment and Plan:  1.  Dyspnea on exertion and intermittent exertional chest pain in the setting of uncontrolled type 2 diabetes mellitus, hyperlipidemia, hypertension, tobacco abuse with COPD, and aortic atherosclerosis by plain film imaging.  I reviewed her ECG which is overall nonspecific.  Plan is to obtain further objective ischemic assessment via [place].  2.  Uncontrolled type 2 diabetes mellitus, recent hemoglobin A1c 11.4.  She is on Glucophage.  She admits that she has not been compliant with treatment but seems to be more focused at this time.  Keep follow-up with Dr. Luan Pulling.  3.  Asymptomatic aortic atherosclerosis by plain film imaging.  4.  Hyperlipidemia, on Pravachol.  5.  Essential hypertension, on lisinopril.  6.  Ongoing tobacco abuse.  She would like to try Chantix.  7.  COPD, followed by Dr. Luan Pulling on Spiriva and albuterol.  Current medicines were reviewed with the patient today.   Orders Placed This Encounter  Procedures  . NM Myocar Multi W/Spect W/Wall Motion / EF  . EKG 12-Lead    Disposition: Call with test results.  Signed, Satira Sark, MD, [facility] 03/22/2018 1:32 PM    [facility], [place], [place] [address] Phone: [phone]; Fax: [phone]

## 2018-03-22 NOTE — Patient Instructions (Signed)
Medication Instructions:  Your physician has recommended you make the following change in your medication:   START Chantix  Please continue all other medications as prescribed  Labwork: NONE  Testing/Procedures: Your physician has requested that you have a lexiscan myoview. For further information please visit [email or web address]. Please follow instruction sheet, as given.  Follow-Up: Your physician recommends that you schedule a follow-up appointment PENDING TEST RESULTS   Any Other Special Instructions Will Be Listed Below (If Applicable).  If you need a refill on your cardiac medications before your next appointment, please call your pharmacy.

## 2018-03-22 NOTE — Addendum Note (Signed)
Addended by: Acquanetta Chain on: 03/22/2018 01:35 PM   Modules accepted: Orders

## 2018-03-25 ENCOUNTER — Encounter (HOSPITAL_BASED_OUTPATIENT_CLINIC_OR_DEPARTMENT_OTHER)
Admission: RE | Admit: 2018-03-25 | Discharge: 2018-03-25 | Disposition: A | Discharge status: Home / Self Care | Payer: Medicaid Other | Source: Ambulatory Visit | Attending: Cardiology | Admitting: Cardiology

## 2018-03-25 ENCOUNTER — Ambulatory Visit (HOSPITAL_COMMUNITY)
Admission: RE | Admit: 2018-03-25 | Discharge: 2018-03-25 | Disposition: A | Discharge status: Home / Self Care | Payer: Medicaid Other | Source: Ambulatory Visit | Attending: Cardiology | Admitting: Cardiology

## 2018-03-25 ENCOUNTER — Encounter (HOSPITAL_COMMUNITY): Payer: Self-pay

## 2018-03-25 DIAGNOSIS — R0609 Other forms of dyspnea: Secondary | ICD-10-CM

## 2018-03-25 DIAGNOSIS — R079 Chest pain, unspecified: Secondary | ICD-10-CM | POA: Insufficient documentation

## 2018-03-25 LAB — NM MYOCAR MULTI W/SPECT W/WALL MOTION / EF
CHL CUP NUCLEAR SRS: 8
CHL CUP RESTING HR STRESS: 72 {beats}/min
LV sys vol: 26 mL
LVDIAVOL: 69 mL (ref 46–106)
NUC STRESS TID: 1.55
Peak HR: 94 {beats}/min
RATE: 0.32
SDS: 1
SSS: 9

## 2018-03-25 MED ORDER — TECHNETIUM TC 99M TETROFOSMIN IV KIT
10.0000 | PACK | Freq: Once | INTRAVENOUS | Status: AC | PRN
  Administered 2018-03-25: 10 via INTRAVENOUS

## 2018-03-25 MED ORDER — SODIUM CHLORIDE 0.9% FLUSH
INTRAVENOUS | Status: AC
  Administered 2018-03-25: 10 mL via INTRAVENOUS
  Filled 2018-03-25: qty 10

## 2018-03-25 MED ORDER — REGADENOSON 0.4 MG/5ML IV SOLN
INTRAVENOUS | Status: AC
  Administered 2018-03-25: 0.4 mg via INTRAVENOUS
  Filled 2018-03-25: qty 5

## 2018-03-25 MED ORDER — TECHNETIUM TC 99M TETROFOSMIN IV KIT
30.0000 | PACK | Freq: Once | INTRAVENOUS | Status: AC | PRN
  Administered 2018-03-25: 30 via INTRAVENOUS

## 2018-03-26 ENCOUNTER — Telehealth: Payer: Self-pay | Admitting: *Deleted

## 2018-03-26 NOTE — Telephone Encounter (Signed)
-----   Message from Satira Sark, MD sent at 03/26/2018  8:32 AM EDT ----- Results reviewed.  Test results consistent with underlying ischemic heart disease, potentially obstructive stenoses.  In light of her symptoms noted at recent consultation, would suggest arranging office follow-up (me or APP), and discuss the possibility of a diagnostic cardiac catheterization. A copy of this test should be forwarded to Sinda Du, MD.

## 2018-03-26 NOTE — Telephone Encounter (Signed)
Patient informed and verbalized understanding of plan. 

## 2018-04-07 ENCOUNTER — Encounter: Payer: Self-pay | Admitting: Cardiology

## 2018-04-07 ENCOUNTER — Encounter (INDEPENDENT_AMBULATORY_CARE_PROVIDER_SITE_OTHER): Payer: Self-pay | Admitting: *Deleted

## 2018-04-07 ENCOUNTER — Ambulatory Visit (INDEPENDENT_AMBULATORY_CARE_PROVIDER_SITE_OTHER): Payer: Medicaid Other | Admitting: Cardiology

## 2018-04-07 VITALS — BP 128/66 | HR 82 | Ht 61.0 in | Wt 179.0 lb

## 2018-04-07 DIAGNOSIS — R9439 Abnormal result of other cardiovascular function study: Secondary | ICD-10-CM

## 2018-04-07 DIAGNOSIS — E1165 Type 2 diabetes mellitus with hyperglycemia: Secondary | ICD-10-CM | POA: Diagnosis not present

## 2018-04-07 DIAGNOSIS — I10 Essential (primary) hypertension: Secondary | ICD-10-CM

## 2018-04-07 DIAGNOSIS — Z720 Tobacco use: Secondary | ICD-10-CM | POA: Diagnosis not present

## 2018-04-07 DIAGNOSIS — E782 Mixed hyperlipidemia: Secondary | ICD-10-CM

## 2018-04-07 MED ORDER — BISOPROLOL FUMARATE 5 MG PO TABS: 2.5000 mg | ORAL_TABLET | Freq: Every day | ORAL | 3 refills | Status: DC

## 2018-04-07 NOTE — Progress Notes (Signed)
noted 

## 2018-04-07 NOTE — Progress Notes (Signed)
Patient scheduled for colonoscopy 04/26/2018

## 2018-04-07 NOTE — Addendum Note (Signed)
Addended by: Barbarann Ehlers A on: 04/07/2018 02:40 PM   Modules accepted: Orders

## 2018-04-07 NOTE — Patient Instructions (Addendum)
Your physician recommends that you schedule a follow-up appointment in: after your colonoscopy, please call us with that apt date so we can make your apt with Dr.McDowell     START Bisoprolol 2.5 mg daily     No labs or tests done today     Thank you for choosing [facility] !

## 2018-04-07 NOTE — Progress Notes (Signed)
Cardiology Office Note  Date: 04/07/2018   ID: Lauren David, Lauren David 02-01-1961, MRN [id number]  PCP: Sinda Du, MD  Primary Cardiologist: Rozann Lesches, MD   Chief Complaint  Patient presents with  . Cardiac follow-up    History of Present Illness: Lauren David is a 57 y.o. female seen recently in consultation, now presenting for a follow-up visit.  She was referred for ischemic testing due to dyspnea on exertion and intermittent chest discomfort in the setting of multiple cardiac risk factors and documented aortic atherosclerosis by plain film imaging.  Lexiscan Myoview as detailed below was abnormal with findings suggestive of anterior/anteroseptal infarct scar with mild to moderate peri-infarct ischemia and LVEF 55 to 65%.  TID ratio was elevated at 1.55 which could also be an indicator of diffuse subendocardial ischemia.  She is here today with her daughter.  We discussed the results of her stress testing and likelihood of underlying ischemic heart disease.  She tells me that she has also had trouble with intermittent rectal bleeding and unexplained weight loss.  In fact, she is scheduled to undergo a diagnostic colonoscopy in October with Dr. Laural Golden.  Today we discussed rationale for ultimately pursuing a diagnostic cardiac catheterization to define coronary anatomy and assess for revascularization options.  Biggest concern at this time however in light of her rectal bleeding and pending colonoscopy, is that she would have to have dual antiplatelet therapy interrupted if a DES to be placed.  In the absence of unstable cardiac symptoms, it would be reasonable for her to undergo GI work-up first to clarify the situation prior to pursuing a coronary intervention.  Otherwise, if things were done in reverse, she would either have to defer GI testing for 6 months to a year, or take the risk of coming off of dual antiplatelet therapy prematurely.  Past Medical History:  Diagnosis  Date  . Anxiety   . Chronic back pain   . COPD (chronic obstructive pulmonary disease) ([place])   . Depression   . Essential hypertension   . Hyperlipidemia   . Type 2 diabetes mellitus ([place])     Past Surgical History:  Procedure Laterality Date  . TOTAL VAGINAL HYSTERECTOMY      Current Outpatient Medications  Medication Sig Dispense Refill  . albuterol (PROVENTIL HFA;VENTOLIN HFA) 108 (90 Base) MCG/ACT inhaler Inhale 2 puffs into the lungs every 4 (four) hours as needed for wheezing or shortness of breath.    . lisinopril (PRINIVIL,ZESTRIL) 5 MG tablet Take 5 mg by mouth daily.    . metFORMIN (GLUCOPHAGE) 500 MG tablet Take by mouth 2 (two) times daily with a meal.    . pravastatin (PRAVACHOL) 40 MG tablet Take 40 mg by mouth daily.    Marland Kitchen tiotropium (SPIRIVA) 18 MCG inhalation capsule Place 18 mcg into inhaler and inhale daily.    . varenicline (CHANTIX STARTING MONTH PAK) 0.5 MG X 11 & 1 MG X 42 tablet Take one 0.5 mg tablet by mouth once daily for 3 days, then increase to one 0.5 mg tablet twice daily for 4 days, then increase to one 1 mg tablet twice daily. 53 tablet 0   No current facility-administered medications for this visit.    Allergies:  Percocet [oxycodone-acetaminophen]   Social History: The patient  reports that she has been smoking cigarettes. She has never used smokeless tobacco. She reports that she does not drink alcohol or use drugs.   Family History: The patient's family history includes Hypertension  in her father.   ROS:  Please see the history of present illness. Otherwise, complete review of systems is positive for none.  All other systems are reviewed and negative.   Physical Exam: VS:  BP 128/66   Pulse 82   Ht 5\' 1"  (1.549 m)   Wt 179 lb (81.2 kg)   SpO2 95%   BMI 33.82 kg/m , BMI Body mass index is 33.82 kg/m.  Wt Readings from Last 3 Encounters:  04/07/18 179 lb (81.2 kg)  03/22/18 177 lb (80.3 kg)  03/18/18 176 lb 1.6 oz (79.9 kg)      General: Patient appears comfortable at rest. HEENT: Conjunctiva and lids normal, oropharynx clear. Neck: Supple, no elevated JVP or carotid bruits, no thyromegaly. Lungs: Clear to auscultation, nonlabored breathing at rest. Cardiac: Regular rate and rhythm, no S3 or significant systolic murmur. Abdomen: Soft, nontender, bowel sounds present. Extremities: No pitting edema, distal pulses 2+. Skin: Warm and dry. Musculoskeletal: No kyphosis. Neuropsychiatric: Alert and oriented x3, affect grossly appropriate.  ECG: I personally reviewed the tracing from 03/22/2018 which showed sinus rhythm with low voltage.  Recent Labwork:  July 2019: Glucose 314, BUN 9, creatinine 0.60, potassium 4.3, AST 37, ALT 41, hemoglobin A1c 11.4, hemoglobin 15.8, platelets 273  Other Studies Reviewed Today:  Lexiscan Myoview 03/25/2018:  There was no ST segment deviation noted during stress.  Findings consistent with prior anterior/ansteroseptal myocardial infarction with mild to moderate peri-infarct ischemia.  This is a low to intermediate risk study. TID is elevated at 1.55, which may suggest higher risk.  The left ventricular ejection fraction is normal (55-65%).  Assessment and Plan:  1.  Abnormal Lexiscan Myoview indicating suspected anterior/anteroseptal scar with mild to moderate peri-infarct ischemia and normal LVEF.  Increased TID ratio also suggest the possibility of more diffuse subendocardial ischemia.  She has multiple cardiac risk factors and in light of dyspnea on exertion with intermittent chest discomfort, we discussed likelihood of underlying obstructive ischemic heart disease and the possibility that she may need revascularization.  Would typically follow-up with a diagnostic cardiac catheterization at this time, however in light of her recurring rectal bleeding and weight loss as discussed above and recent GI work-up, would suggest pursuing diagnostic colonoscopy first in the near future.   If she were to undergo revascularization with DES first, she would not be able to come off dual antiplatelet therapy, both increasing the likelihood of continued GI bleeding, and also delaying diagnosis for 6 months to a year.  She is not having unstable cardiac symptoms, and should be able to proceed with a colonoscopy at an acceptable risk.  Would defer on starting antiplatelet regimen now, will go ahead and initiate low-dose bisoprolol as an antianginal and otherwise continue her current regimen.  I spoke with Ms. Setzer in the GI office and will also forward note to Dr. Laural Golden.  2.  Uncontrolled type 2 diabetes mellitus with recent hemoglobin A1c 11.4.  She is working with Dr. Luan Pulling on this.  3.  Tobacco abuse.  Currently on Chantix and has nicotine gum.  She is trying to quit.  4.  Essential hypertension, blood pressure is well controlled today.  5.  Mixed hyperlipidemia, on Pravachol.  She is following with Dr. Luan Pulling.  6.  COPD, on Spiriva and albuterol.  Current medicines were reviewed with the patient today.  Disposition: Schedule office follow-up after colonoscopy.  Signed, Satira Sark, MD, [facility] 04/07/2018 2:31 PM    [facility]. [address], [place], [place] [address] Phone: [phone]; Fax: [phone]

## 2018-04-08 ENCOUNTER — Telehealth: Payer: Self-pay

## 2018-04-08 ENCOUNTER — Telehealth: Payer: Self-pay | Admitting: Cardiology

## 2018-04-08 MED ORDER — ASPIRIN EC 81 MG PO TBEC: 81.0000 mg | DELAYED_RELEASE_TABLET | Freq: Every day | ORAL | 3 refills | Status: DC

## 2018-04-08 NOTE — Telephone Encounter (Signed)
Pt will start ASA 81  Daily and stop 7 days prior to colonoscopy,. We await [facility] to approve bisoprolol

## 2018-04-08 NOTE — Telephone Encounter (Signed)
[place] [facility]  [phone] Pt ID [id number] states they entered bystolic not bisprolol, they are going to have to re-submit, I asked for expedited return . I have faxed form from [facility] with Bisoprolol printed  on it.They entered in error bystolic  New ID # [id number]    [place] [facility] will authorize 3 day supply for now, [facility] will fill 3 day supply and call pt

## 2018-04-08 NOTE — Progress Notes (Signed)
You are quite welcome. Have a great day.

## 2018-04-08 NOTE — Telephone Encounter (Signed)
Pt states her Metoprolol wasn't sent in to her pharmacy

## 2018-04-26 ENCOUNTER — Encounter (HOSPITAL_COMMUNITY)
Admission: RE | Disposition: A | Discharge status: Home / Self Care | Payer: Self-pay | Source: Ambulatory Visit | Attending: Internal Medicine

## 2018-04-26 ENCOUNTER — Ambulatory Visit (HOSPITAL_COMMUNITY)
Admission: RE | Admit: 2018-04-26 | Discharge: 2018-04-26 | Disposition: A | Discharge status: Home / Self Care | Payer: Medicaid Other | Source: Ambulatory Visit | Attending: Internal Medicine | Admitting: Internal Medicine

## 2018-04-26 ENCOUNTER — Other Ambulatory Visit: Payer: Self-pay

## 2018-04-26 ENCOUNTER — Encounter (HOSPITAL_COMMUNITY): Payer: Self-pay | Admitting: *Deleted

## 2018-04-26 DIAGNOSIS — G8929 Other chronic pain: Secondary | ICD-10-CM | POA: Diagnosis not present

## 2018-04-26 DIAGNOSIS — Z7951 Long term (current) use of inhaled steroids: Secondary | ICD-10-CM | POA: Insufficient documentation

## 2018-04-26 DIAGNOSIS — D123 Benign neoplasm of transverse colon: Secondary | ICD-10-CM | POA: Insufficient documentation

## 2018-04-26 DIAGNOSIS — Z7984 Long term (current) use of oral hypoglycemic drugs: Secondary | ICD-10-CM | POA: Insufficient documentation

## 2018-04-26 DIAGNOSIS — F17210 Nicotine dependence, cigarettes, uncomplicated: Secondary | ICD-10-CM | POA: Insufficient documentation

## 2018-04-26 DIAGNOSIS — M549 Dorsalgia, unspecified: Secondary | ICD-10-CM | POA: Insufficient documentation

## 2018-04-26 DIAGNOSIS — D125 Benign neoplasm of sigmoid colon: Secondary | ICD-10-CM | POA: Insufficient documentation

## 2018-04-26 DIAGNOSIS — J449 Chronic obstructive pulmonary disease, unspecified: Secondary | ICD-10-CM | POA: Diagnosis not present

## 2018-04-26 DIAGNOSIS — I10 Essential (primary) hypertension: Secondary | ICD-10-CM | POA: Diagnosis not present

## 2018-04-26 DIAGNOSIS — Z7982 Long term (current) use of aspirin: Secondary | ICD-10-CM | POA: Insufficient documentation

## 2018-04-26 DIAGNOSIS — K5730 Diverticulosis of large intestine without perforation or abscess without bleeding: Secondary | ICD-10-CM | POA: Insufficient documentation

## 2018-04-26 DIAGNOSIS — D122 Benign neoplasm of ascending colon: Secondary | ICD-10-CM | POA: Insufficient documentation

## 2018-04-26 DIAGNOSIS — Z79899 Other long term (current) drug therapy: Secondary | ICD-10-CM | POA: Insufficient documentation

## 2018-04-26 DIAGNOSIS — K648 Other hemorrhoids: Secondary | ICD-10-CM | POA: Insufficient documentation

## 2018-04-26 DIAGNOSIS — Z885 Allergy status to narcotic agent status: Secondary | ICD-10-CM | POA: Insufficient documentation

## 2018-04-26 DIAGNOSIS — E785 Hyperlipidemia, unspecified: Secondary | ICD-10-CM | POA: Diagnosis not present

## 2018-04-26 DIAGNOSIS — R634 Abnormal weight loss: Secondary | ICD-10-CM | POA: Diagnosis not present

## 2018-04-26 DIAGNOSIS — Z8249 Family history of ischemic heart disease and other diseases of the circulatory system: Secondary | ICD-10-CM | POA: Diagnosis not present

## 2018-04-26 DIAGNOSIS — Z90710 Acquired absence of both cervix and uterus: Secondary | ICD-10-CM | POA: Insufficient documentation

## 2018-04-26 DIAGNOSIS — F419 Anxiety disorder, unspecified: Secondary | ICD-10-CM | POA: Diagnosis not present

## 2018-04-26 DIAGNOSIS — K625 Hemorrhage of anus and rectum: Secondary | ICD-10-CM | POA: Insufficient documentation

## 2018-04-26 DIAGNOSIS — F329 Major depressive disorder, single episode, unspecified: Secondary | ICD-10-CM | POA: Insufficient documentation

## 2018-04-26 DIAGNOSIS — E119 Type 2 diabetes mellitus without complications: Secondary | ICD-10-CM | POA: Diagnosis not present

## 2018-04-26 HISTORY — PX: POLYPECTOMY: SHX5525

## 2018-04-26 HISTORY — PX: COLONOSCOPY: SHX5424

## 2018-04-26 LAB — GLUCOSE, CAPILLARY: Glucose-Capillary: 206 mg/dL — ABNORMAL HIGH (ref 70–99)

## 2018-04-26 SURGERY — COLONOSCOPY
Anesthesia: Moderate Sedation

## 2018-04-26 MED ORDER — MIDAZOLAM HCL 5 MG/5ML IJ SOLN
INTRAMUSCULAR | Status: AC
  Filled 2018-04-26: qty 10

## 2018-04-26 MED ORDER — STERILE WATER FOR IRRIGATION IR SOLN
Status: DC | PRN
  Administered 2018-04-26: 09:00:00

## 2018-04-26 MED ORDER — DICYCLOMINE HCL 10 MG PO CAPS: 10.0000 mg | ORAL_CAPSULE | Freq: Three times a day (TID) | ORAL | 1 refills | Status: DC | PRN

## 2018-04-26 MED ORDER — SODIUM CHLORIDE 0.9 % IV SOLN
INTRAVENOUS | Status: DC
  Administered 2018-04-26: 09:00:00 via INTRAVENOUS

## 2018-04-26 MED ORDER — MEPERIDINE HCL 50 MG/ML IJ SOLN
INTRAMUSCULAR | Status: AC
  Filled 2018-04-26: qty 1

## 2018-04-26 MED ORDER — MIDAZOLAM HCL 5 MG/5ML IJ SOLN
INTRAMUSCULAR | Status: DC | PRN
  Administered 2018-04-26: 2 mg via INTRAVENOUS
  Administered 2018-04-26: 1 mg via INTRAVENOUS
  Administered 2018-04-26 (×2): 2 mg via INTRAVENOUS

## 2018-04-26 MED ORDER — MEPERIDINE HCL 50 MG/ML IJ SOLN
INTRAMUSCULAR | Status: DC | PRN
  Administered 2018-04-26 (×2): 25 mg via INTRAVENOUS

## 2018-04-26 NOTE — H&P (Addendum)
Lauren David is an 57 y.o. female.   Chief Complaint: Patient is here for colonoscopy. HPI: Patient is 57 year old Caucasian female who is seeing blood with her bowel movements for the last 3 months.  It is small in amount.  She states this occurs only when she is constipated which is not often.   She rarely has more than 2 bowel movements per day.  She also complains of intermittent left upper quadrant pain.  She has lost 30 pounds this year.  She has had good appetite.  She says her diabetic control has not been good but she has become a lot more compliant.  She smokes cigarettes.  She is to undergo cardiac cath in near future once results of colonoscopy are known. Family history is positive for CRC and maternal grandfather who was in his early 44s.  Past Medical History:  Diagnosis Date  . Anxiety   . Chronic back pain   . COPD (chronic obstructive pulmonary disease) ([place])   . Depression   . Essential hypertension   . Hyperlipidemia   . Type 2 diabetes mellitus ([place])     Past Surgical History:  Procedure Laterality Date  . TOTAL VAGINAL HYSTERECTOMY      Family History  Problem Relation Age of Onset  . Hypertension Father    Social History:  reports that she has been smoking cigarettes. She has never used smokeless tobacco. She reports that she does not drink alcohol or use drugs.  Allergies:  Allergies  Allergen Reactions  . Percocet [Oxycodone-Acetaminophen] Rash    Medications Prior to Admission  Medication Sig Dispense Refill  . albuterol (PROVENTIL HFA;VENTOLIN HFA) 108 (90 Base) MCG/ACT inhaler Inhale 2 puffs into the lungs every 4 (four) hours as needed for wheezing or shortness of breath.    Marland Kitchen aspirin EC 81 MG tablet Take 1 tablet (81 mg total) by mouth daily. 90 tablet 3  . bisoprolol (ZEBETA) 5 MG tablet Take 0.5 tablets (2.5 mg total) by mouth daily. 45 tablet 3  . lisinopril (PRINIVIL,ZESTRIL) 5 MG tablet Take 5 mg by mouth daily.    . metFORMIN (GLUCOPHAGE)  500 MG tablet Take by mouth 2 (two) times daily with a meal.    . pravastatin (PRAVACHOL) 40 MG tablet Take 40 mg by mouth at bedtime.     Marland Kitchen tiotropium (SPIRIVA) 18 MCG inhalation capsule Place 18 mcg into inhaler and inhale daily.    . varenicline (CHANTIX STARTING MONTH PAK) 0.5 MG X 11 & 1 MG X 42 tablet Take one 0.5 mg tablet by mouth once daily for 3 days, then increase to one 0.5 mg tablet twice daily for 4 days, then increase to one 1 mg tablet twice daily. 53 tablet 0    Results for orders placed or performed during the hospital encounter of 04/26/18 (from the past 48 hour(s))  Glucose, capillary     Status: Abnormal   Collection Time: 04/26/18  8:38 AM  Result Value Ref Range   Glucose-Capillary 206 (H) 70 - 99 mg/dL   No results found.  ROS  Blood pressure 130/74, pulse 69, temperature 98.7 F (37.1 C), temperature source Oral, resp. rate 13, SpO2 100 %. Physical Exam  Constitutional: She appears well-developed and well-nourished.  HENT:  Mouth/Throat: Oropharynx is clear and moist.  Eyes: Conjunctivae are normal. No scleral icterus.  Neck: No thyromegaly present.  Cardiovascular: Normal rate, regular rhythm and normal heart sounds.  No murmur heard. Respiratory: Effort normal and breath sounds normal.  GI: Soft. She exhibits no distension and no mass. There is no tenderness.  Musculoskeletal: She exhibits no edema.  Lymphadenopathy:    She has no cervical adenopathy.  Neurological: She is alert.  Skin: Skin is warm and dry.     Assessment/Plan Rectal bleeding. Weight loss. Diagnostic colonoscopy.  Hildred Laser, MD 04/26/2018, 9:14 AM

## 2018-04-26 NOTE — Op Note (Signed)
[facility] Patient Name: Lauren David Procedure Date: 04/26/2018 8:50 AM MRN: [id number] Date of Birth: 11-07-1960 Attending MD: Hildred Laser , MD CSN: [id number] Age: 57 Admit Type: Outpatient Procedure:                Colonoscopy Indications:              Rectal bleeding Providers:                Hildred Laser, MD, Jeanann Lewandowsky. Sharon Seller, RN, Randa Spike, Technician Referring MD:             Jasper Loser. Luan Pulling, MD Medicines:                Meperidine 50 mg IV, Midazolam 7 mg IV Complications:            No immediate complications. Estimated Blood Loss:     Estimated blood loss was minimal. Procedure:                Pre-Anesthesia Assessment:                           - Prior to the procedure, a History and Physical                            was performed, and patient medications and                            allergies were reviewed. The patient's tolerance of                            previous anesthesia was also reviewed. The risks                            and benefits of the procedure and the sedation                            options and risks were discussed with the patient.                            All questions were answered, and informed consent                            was obtained. Prior Anticoagulants: The patient has                            taken no previous anticoagulant or antiplatelet                            agents. ASA Grade Assessment: II - A patient with                            mild systemic disease. After reviewing the risks  and benefits, the patient was deemed in                            satisfactory condition to undergo the procedure.                           After obtaining informed consent, the colonoscope                            was passed under direct vision. Throughout the                            procedure, the patient's blood pressure, pulse, and     oxygen saturations were monitored continuously. The                            PCF-H190DL ([id number]) scope was introduced through                            the anus and advanced to the the terminal ileum,                            with identification of the appendiceal orifice and                            IC valve. The colonoscopy was performed without                            difficulty. The patient tolerated the procedure                            well. The quality of the bowel preparation was                            excellent. The terminal ileum, ileocecal valve,                            appendiceal orifice, and rectum were photographed. Scope In: 9:26:17 AM Scope Out: 9:45:08 AM Scope Withdrawal Time: 0 hours 16 minutes 10 seconds  Total Procedure Duration: 0 hours 18 minutes 51 seconds  Findings:      The perianal and digital rectal examinations were normal.      The terminal ileum appeared normal.      A small polyp was found in the ascending colon. The polyp was sessile.       Biopsies were taken with a cold forceps for histology. The pathology       specimen was placed into Bottle Number 1.      Two flat polyps were found in the proximal sigmoid colon and distal       transverse colon. The polyps were 5 mm in size. These polyps were       removed with a cold snare. Resection and retrieval were complete. The       pathology specimen was placed into Bottle Number 1.      Scattered diverticula were found in the sigmoid colon.  Internal hemorrhoids were found during retroflexion. The hemorrhoids       were small. Impression:               - The examined portion of the ileum was normal.                           - One small polyp in the ascending colon. Biopsied.                           - Two 5 mm polyps in the proximal sigmoid colon and                            in the distal transverse colon, removed with a cold                            snare. Resected and  retrieved.                           - Diverticulosis in the sigmoid colon.                           - Internal hemorrhoids. Moderate Sedation:      Moderate (conscious) sedation was administered by the endoscopy nurse       and supervised by the endoscopist. The following parameters were       monitored: oxygen saturation, heart rate, blood pressure, CO2       capnography and response to care. Total physician intraservice time was       25 minutes. Recommendation:           - Patient has a contact number available for                            emergencies. The signs and symptoms of potential                            delayed complications were discussed with the                            patient. Return to normal activities tomorrow.                            Written discharge instructions were provided to the                            patient.                           - Diabetic (ADA) diet today.                           - Continue present medications.                           - No aspirin, ibuprofen, naproxen, or other  non-steroidal anti-inflammatory drugs for 1 day.                           - Dicyclomine 10 mg po tid prn.                           - Await pathology results.                           - Abdomino-pelvic CT to be scheduled.                           - Repeat colonoscopy is recommended for                            surveillance. The colonoscopy date will be                            determined after pathology results from today's                            exam become available for review. Procedure Code(s):        --- Professional ---                           [phone], Colonoscopy, flexible; with removal of                            tumor(s), polyp(s), or other lesion(s) by snare                            technique                           45380, 59, Colonoscopy, flexible; with biopsy,                            single or  multiple                           G0500, Moderate sedation services provided by the                            same physician or other qualified health care                            professional performing a gastrointestinal                            endoscopic service that sedation supports,                            requiring the presence of an independent trained                            observer to assist in the monitoring of the  patient's level of consciousness and physiological                            status; initial 15 minutes of intra-service time;                            patient age 67 years or older (additional time may                            be reported with [phone], as appropriate)                           [phone], Moderate sedation services provided by the                            same physician or other qualified health care                            professional performing the diagnostic or                            therapeutic service that the sedation supports,                            requiring the presence of an independent trained                            observer to assist in the monitoring of the                            patient's level of consciousness and physiological                            status; each additional 15 minutes intraservice                            time (List separately in addition to code for                            primary service) Diagnosis Code(s):        --- Professional ---                           K64.8, Other hemorrhoids                           D12.2, Benign neoplasm of ascending colon                           D12.5, Benign neoplasm of sigmoid colon                           D12.3, Benign neoplasm of transverse colon (hepatic  flexure or splenic flexure)                           K62.5, Hemorrhage of anus and rectum                           K57.30,  Diverticulosis of large intestine without                            perforation or abscess without bleeding CPT copyright 2017 [employer]. All rights reserved. The codes documented in this report are preliminary and upon coder review may  be revised to meet current compliance requirements. Hildred Laser, MD Hildred Laser, MD 04/26/2018 9:57:14 AM This report has been signed electronically. Number of Addenda: 0

## 2018-04-26 NOTE — Discharge Instructions (Signed)
No aspirin or NSAIDs for 24 hours. Resume other medications as before. Dicyclomine 10 mg by mouth 3 times a day as needed for abdominal pain. Resume diabetic diet as before. No driving for 24 hours. Abdominopelvic CT to be scheduled.  Office will call. Physician will call with biopsy results.     Colonoscopy, Adult, Care After This sheet gives you information about how to care for yourself after your procedure. Your doctor may also give you more specific instructions. If you have problems or questions, call your doctor. Follow these instructions at home: General instructions   For the first 24 hours after the procedure: ? Do not drive or use machinery. ? Do not sign important documents. ? Do not drink alcohol. ? Do your daily activities more slowly than normal. ? Eat foods that are soft and easy to digest. ? Rest often.  Take over-the-counter or prescription medicines only as told by your doctor.  It is up to you to get the results of your procedure. Ask your doctor, or the department performing the procedure, when your results will be ready. To help cramping and bloating:  Try walking around.  Put heat on your belly (abdomen) as told by your doctor. Use a heat source that your doctor recommends, such as a moist heat pack or a heating pad. ? Put a towel between your skin and the heat source. ? Leave the heat on for 20-30 minutes. ? Remove the heat if your skin turns bright red. This is especially important if you cannot feel pain, heat, or cold. You can get burned. Eating and drinking  Drink enough fluid to keep your pee (urine) clear or pale yellow.  Return to your normal diet as told by your doctor. Avoid heavy or fried foods that are hard to digest.  Avoid drinking alcohol for as long as told by your doctor. Contact a doctor if:  You have blood in your poop (stool) 2-3 days after the procedure. Get help right away if:  You have more than a small amount of blood in  your poop.  You see large clumps of tissue (blood clots) in your poop.  Your belly is swollen.  You feel sick to your stomach (nauseous).  You throw up (vomit).  You have a fever.  You have belly pain that gets worse, and medicine does not help your pain. This information is not intended to replace advice given to you by your health care provider. Make sure you discuss any questions you have with your health care provider. Document Released: 08/23/2010 Document Revised: 04/14/2016 Document Reviewed: 04/14/2016 Elsevier Interactive Patient Education  2017 [place].     Colon Polyps Polyps are tissue growths inside the body. Polyps can grow in many places, including the large intestine (colon). A polyp may be a round bump or a mushroom-shaped growth. You could have one polyp or several. Most colon polyps are noncancerous (benign). However, some colon polyps can become cancerous over time. What are the causes? The exact cause of colon polyps is not known. What increases the risk? This condition is more likely to develop in people who:  Have a family history of colon cancer or colon polyps.  Are older than 67 or older than 45 if they are African American.  Have inflammatory bowel disease, such as ulcerative colitis or Crohn disease.  Are overweight.  Smoke cigarettes.  Do not get enough exercise.  Drink too much alcohol.  Eat a diet that is: ? High in  fat and red meat. ? Low in fiber.  Had childhood cancer that was treated with abdominal radiation.  What are the signs or symptoms? Most polyps do not cause symptoms. If you have symptoms, they may include:  Blood coming from your rectum when having a bowel movement.  Blood in your stool.The stool may look dark red or black.  A change in bowel habits, such as constipation or diarrhea.  How is this diagnosed? This condition is diagnosed with a colonoscopy. This is a procedure that uses a lighted, flexible scope  to look at the inside of your colon. How is this treated? Treatment for this condition involves removing any polyps that are found. Those polyps will then be tested for cancer. If cancer is found, your health care provider will talk to you about options for colon cancer treatment. Follow these instructions at home: Diet  Eat plenty of fiber, such as fruits, vegetables, and whole grains.  Eat foods that are high in calcium and vitamin D, such as milk, cheese, yogurt, eggs, liver, fish, and broccoli.  Limit foods high in fat, red meats, and processed meats, such as hot dogs, sausage, bacon, and lunch meats.  Maintain a healthy weight, or lose weight if recommended by your health care provider. General instructions  Do not smoke cigarettes.  Do not drink alcohol excessively.  Keep all follow-up visits as told by your health care provider. This is important. This includes keeping regularly scheduled colonoscopies. Talk to your health care provider about when you need a colonoscopy.  Exercise every day or as told by your health care provider. Contact a health care provider if:  You have new or worsening bleeding during a bowel movement.  You have new or increased blood in your stool.  You have a change in bowel habits.  You unexpectedly lose weight. This information is not intended to replace advice given to you by your health care provider. Make sure you discuss any questions you have with your health care provider. Document Released: 04/16/2004 Document Revised: 12/27/2015 Document Reviewed: 06/11/2015 Elsevier Interactive Patient Education  [employer].

## 2018-04-28 ENCOUNTER — Other Ambulatory Visit (INDEPENDENT_AMBULATORY_CARE_PROVIDER_SITE_OTHER): Payer: Self-pay | Admitting: *Deleted

## 2018-04-28 DIAGNOSIS — R634 Abnormal weight loss: Secondary | ICD-10-CM

## 2018-04-30 NOTE — H&P (View-Only) (Signed)
Cardiology Office Note  Date: 05/03/2018   ID: Lauren David, Lauren David January 05, 1961, MRN [id number]  PCP: Sinda Du, MD  Primary Cardiologist: Rozann Lesches, MD   Chief Complaint  Patient presents with  . Cardiac follow-up    History of Present Illness: Lauren David is a 57 y.o. female last seen in early September.  She is here for a follow-up visit.  She does not report progressive symptoms, but still has dyspnea on exertion and intermittent chest discomfort as before.  She underwent colonoscopy with Dr. Laural Golden on September 23.  She had small internal hemorrhoids, diverticulosis of the sigmoid colon, normal ileum, and a total of 3 small polyps that were resected and biopsied.  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  Follow-up colonoscopy planned in 7 years.  Abdominal and pelvic CT planned with history of weight loss.  Today we discussed getting her scheduled for a diagnostic cardiac catheterization next week, presuming that her CT imaging later this week does not show any acute issues that would need to be addressed prior to angiography, and in particular excluding anything that would be a potential barrier to use of dual antiplatelet therapy in case she does need revascularization.  Lexiscan Myoview as detailed below was abnormal with findings suggestive of anterior/anteroseptal infarct scar with mild to moderate peri-infarct ischemia and LVEF 55 to 65%.  TID ratio was elevated at 1.55 which could also be an indicator of diffuse subendocardial ischemia.  Past Medical History:  Diagnosis Date  . Anxiety   . Chronic back pain   . COPD (chronic obstructive pulmonary disease) ([place])   . Depression   . Essential hypertension   . Hyperlipidemia   . Type 2 diabetes mellitus ([place])     Past Surgical History:  Procedure Laterality Date  . TOTAL VAGINAL HYSTERECTOMY      Current Outpatient Medications  Medication Sig Dispense Refill  . albuterol (PROVENTIL HFA;VENTOLIN HFA) 108  (90 Base) MCG/ACT inhaler Inhale 2 puffs into the lungs every 4 (four) hours as needed for wheezing or shortness of breath.    Marland Kitchen aspirin EC 81 MG tablet Take 1 tablet (81 mg total) by mouth daily. 90 tablet 3  . bisoprolol (ZEBETA) 5 MG tablet Take 0.5 tablets (2.5 mg total) by mouth daily. 45 tablet 3  . dicyclomine (BENTYL) 10 MG capsule Take 1 capsule (10 mg total) by mouth 3 (three) times daily as needed. 60 capsule 1  . lisinopril (PRINIVIL,ZESTRIL) 5 MG tablet Take 5 mg by mouth daily.    . metFORMIN (GLUCOPHAGE) 500 MG tablet Take by mouth 2 (two) times daily with a meal.    . pravastatin (PRAVACHOL) 40 MG tablet Take 40 mg by mouth at bedtime.     Marland Kitchen tiotropium (SPIRIVA) 18 MCG inhalation capsule Place 18 mcg into inhaler and inhale daily.    . varenicline (CHANTIX STARTING MONTH PAK) 0.5 MG X 11 & 1 MG X 42 tablet Take one 0.5 mg tablet by mouth once daily for 3 days, then increase to one 0.5 mg tablet twice daily for 4 days, then increase to one 1 mg tablet twice daily. 53 tablet 0   No current facility-administered medications for this visit.    Allergies:  Percocet [oxycodone-acetaminophen]   Social History: The patient  reports that she has been smoking cigarettes. She has never used smokeless tobacco. She reports that she does not drink alcohol or use drugs.   Family History: The patient's family history includes Hypertension in  her father.   ROS:  Please see the history of present illness. Otherwise, complete review of systems is positive for weight loss.  All other systems are reviewed and negative.   Physical Exam: VS:  BP 118/68   Pulse 80   Ht 5\' 1"  (1.549 m)   Wt 178 lb (80.7 kg)   SpO2 98%   BMI 33.63 kg/m , BMI Body mass index is 33.63 kg/m.  Wt Readings from Last 3 Encounters:  05/03/18 178 lb (80.7 kg)  04/07/18 179 lb (81.2 kg)  03/22/18 177 lb (80.3 kg)    General: Patient appears comfortable at rest. HEENT: Conjunctiva and lids normal, oropharynx  clear. Neck: Supple, no elevated JVP or carotid bruits, no thyromegaly. Lungs: Clear to auscultation, nonlabored breathing at rest. Cardiac: Regular rate and rhythm, no S3 or significant systolic murmur. Abdomen: Soft, nontender, bowel sounds present. Extremities: No pitting edema, distal pulses 2+. Skin: Warm and dry. Musculoskeletal: No kyphosis. Neuropsychiatric: Alert and oriented x3, affect grossly appropriate.  ECG: I personally reviewed the tracing from 03/22/2018 which showed sinus rhythm with low voltage.  Recent Labwork:  July 2019: Glucose 314, BUN 9, creatinine 0.60, potassium 4.3, AST 37, ALT 41, hemoglobin A1c 11.4, hemoglobin 15.8, platelets 273  Other Studies Reviewed Today:  Lexiscan Myoview 03/25/2018:  There was no ST segment deviation noted during stress.  Findings consistent with prior anterior/ansteroseptal myocardial infarction with mild to moderate peri-infarct ischemia.  This is a low to intermediate risk study. TID is elevated at 1.55, which may suggest higher risk.  The left ventricular ejection fraction is normal (55-65%).  Assessment and Plan:  1.  History of dyspnea on exertion and intermittent chest pain, coronary artery calcifications by CT imaging, and abnormal Myoview indicating suspected anterior/anteroseptal scar with mild to moderate peri-infarct ischemia as well as increased TID ratio suggesting possible diffuse subendocardial ischemia.  Angiography was deferred for GI work-up with rectal bleeding, although findings at colonoscopy recently were overall reassuring.  She does have an abdominal/pelvic CT scan to be obtained later this week.  Presuming there are no acute issues or other reasons found that would inhibit use of dual antiplatelet therapy for revascularization, we will go ahead and get her scheduled for a diagnostic cardiac catheterization next week.  Risks and benefits discussed, she is in agreement to proceed.  2.  Tobacco abuse with  COPD.  She has been using Chantix and trying to quit completely.  3.  Essential hypertension, blood pressure well controlled today.  4.  Mixed hyperlipidemia on Pravachol.  She is following with Dr. Luan Pulling.  5.  Uncontrolled type 2 diabetes mellitus, hemoglobin A1c 11.4.  She is followed by Dr. Luan Pulling.  Current medicines were reviewed with the patient today.   Orders Placed This Encounter  Procedures  . Basic metabolic panel  . CBC    Disposition: Follow-up after procedure.  Signed, Satira Sark, MD, [facility] 05/03/2018 10:31 AM    [facility] [place], [place], [place] [address] Phone: [phone]; Fax: [phone]

## 2018-04-30 NOTE — Progress Notes (Addendum)
Cardiology Office Note  Date: 05/03/2018   ID: Lauren, David 08/24/1960, MRN [id number]  PCP: Lauren Du, MD  Primary Cardiologist: Lauren Lesches, MD   Chief Complaint  Patient presents with  . Cardiac follow-up    History of Present Illness: Lauren David is a 57 y.o. female last seen in early September.  She is here for a follow-up visit.  She does not report progressive symptoms, but still has dyspnea on exertion and intermittent chest discomfort as before.  She underwent colonoscopy with Dr. Laural Golden on September 23.  She had small internal hemorrhoids, diverticulosis of the sigmoid colon, normal ileum, and a total of 3 small polyps that were resected and biopsied.  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  Follow-up colonoscopy planned in 7 years.  Abdominal and pelvic CT planned with history of weight loss.  Today we discussed getting her scheduled for a diagnostic cardiac catheterization next week, presuming that her CT imaging later this week does not show any acute issues that would need to be addressed prior to angiography, and in particular excluding anything that would be a potential barrier to use of dual antiplatelet therapy in case she does need revascularization.  Lexiscan Myoview as detailed below was abnormal with findings suggestive of anterior/anteroseptal infarct scar with mild to moderate peri-infarct ischemia and LVEF 55 to 65%.  TID ratio was elevated at 1.55 which could also be an indicator of diffuse subendocardial ischemia.  Past Medical History:  Diagnosis Date  . Anxiety   . Chronic back pain   . COPD (chronic obstructive pulmonary disease) ([place])   . Depression   . Essential hypertension   . Hyperlipidemia   . Type 2 diabetes mellitus ([place])     Past Surgical History:  Procedure Laterality Date  . TOTAL VAGINAL HYSTERECTOMY      Current Outpatient Medications  Medication Sig Dispense Refill  . albuterol (PROVENTIL HFA;VENTOLIN HFA) 108  (90 Base) MCG/ACT inhaler Inhale 2 puffs into the lungs every 4 (four) hours as needed for wheezing or shortness of breath.    Marland Kitchen aspirin EC 81 MG tablet Take 1 tablet (81 mg total) by mouth daily. 90 tablet 3  . bisoprolol (ZEBETA) 5 MG tablet Take 0.5 tablets (2.5 mg total) by mouth daily. 45 tablet 3  . dicyclomine (BENTYL) 10 MG capsule Take 1 capsule (10 mg total) by mouth 3 (three) times daily as needed. 60 capsule 1  . lisinopril (PRINIVIL,ZESTRIL) 5 MG tablet Take 5 mg by mouth daily.    . metFORMIN (GLUCOPHAGE) 500 MG tablet Take by mouth 2 (two) times daily with a meal.    . pravastatin (PRAVACHOL) 40 MG tablet Take 40 mg by mouth at bedtime.     Marland Kitchen tiotropium (SPIRIVA) 18 MCG inhalation capsule Place 18 mcg into inhaler and inhale daily.    . varenicline (CHANTIX STARTING MONTH PAK) 0.5 MG X 11 & 1 MG X 42 tablet Take one 0.5 mg tablet by mouth once daily for 3 days, then increase to one 0.5 mg tablet twice daily for 4 days, then increase to one 1 mg tablet twice daily. 53 tablet 0   No current facility-administered medications for this visit.    Allergies:  Percocet [oxycodone-acetaminophen]   Social History: The patient  reports that she has been smoking cigarettes. She has never used smokeless tobacco. She reports that she does not drink alcohol or use drugs.   Family History: The patient's family history includes Hypertension in  her father.   ROS:  Please see the history of present illness. Otherwise, complete review of systems is positive for weight loss.  All other systems are reviewed and negative.   Physical Exam: VS:  BP 118/68   Pulse 80   Ht 5\' 1"  (1.549 m)   Wt 178 lb (80.7 kg)   SpO2 98%   BMI 33.63 kg/m , BMI Body mass index is 33.63 kg/m.  Wt Readings from Last 3 Encounters:  05/03/18 178 lb (80.7 kg)  04/07/18 179 lb (81.2 kg)  03/22/18 177 lb (80.3 kg)    General: Patient appears comfortable at rest. HEENT: Conjunctiva and lids normal, oropharynx  clear. Neck: Supple, no elevated JVP or carotid bruits, no thyromegaly. Lungs: Clear to auscultation, nonlabored breathing at rest. Cardiac: Regular rate and rhythm, no S3 or significant systolic murmur. Abdomen: Soft, nontender, bowel sounds present. Extremities: No pitting edema, distal pulses 2+. Skin: Warm and dry. Musculoskeletal: No kyphosis. Neuropsychiatric: Alert and oriented x3, affect grossly appropriate.  ECG: I personally reviewed the tracing from 03/22/2018 which showed sinus rhythm with low voltage.  Recent Labwork:  July 2019: Glucose 314, BUN 9, creatinine 0.60, potassium 4.3, AST 37, ALT 41, hemoglobin A1c 11.4, hemoglobin 15.8, platelets 273  Other Studies Reviewed Today:  Lexiscan Myoview 03/25/2018:  There was no ST segment deviation noted during stress.  Findings consistent with prior anterior/ansteroseptal myocardial infarction with mild to moderate peri-infarct ischemia.  This is a low to intermediate risk study. TID is elevated at 1.55, which may suggest higher risk.  The left ventricular ejection fraction is normal (55-65%).  Assessment and Plan:  1.  History of dyspnea on exertion and intermittent chest pain, coronary artery calcifications by CT imaging, and abnormal Myoview indicating suspected anterior/anteroseptal scar with mild to moderate peri-infarct ischemia as well as increased TID ratio suggesting possible diffuse subendocardial ischemia.  Angiography was deferred for GI work-up with rectal bleeding, although findings at colonoscopy recently were overall reassuring.  She does have an abdominal/pelvic CT scan to be obtained later this week.  Presuming there are no acute issues or other reasons found that would inhibit use of dual antiplatelet therapy for revascularization, we will go ahead and get her scheduled for a diagnostic cardiac catheterization next week.  Risks and benefits discussed, she is in agreement to proceed.  2.  Tobacco abuse with  COPD.  She has been using Chantix and trying to quit completely.  3.  Essential hypertension, blood pressure well controlled today.  4.  Mixed hyperlipidemia on Pravachol.  She is following with Dr. Luan Pulling.  5.  Uncontrolled type 2 diabetes mellitus, hemoglobin A1c 11.4.  She is followed by Dr. Luan Pulling.  Current medicines were reviewed with the patient today.   Orders Placed This Encounter  Procedures  . Basic metabolic panel  . CBC    Disposition: Follow-up after procedure.  Signed, Satira Sark, MD, [facility] 05/03/2018 10:31 AM    [facility] [place], [place], [place] [address] Phone: [phone]; Fax: [phone]

## 2018-05-03 ENCOUNTER — Telehealth: Payer: Self-pay | Admitting: Cardiology

## 2018-05-03 ENCOUNTER — Ambulatory Visit (INDEPENDENT_AMBULATORY_CARE_PROVIDER_SITE_OTHER): Payer: Medicaid Other | Admitting: Cardiology

## 2018-05-03 ENCOUNTER — Encounter: Payer: Self-pay | Admitting: *Deleted

## 2018-05-03 ENCOUNTER — Other Ambulatory Visit: Payer: Self-pay | Admitting: Cardiology

## 2018-05-03 ENCOUNTER — Encounter: Payer: Self-pay | Admitting: Cardiology

## 2018-05-03 VITALS — BP 118/68 | HR 80 | Ht 61.0 in | Wt 178.0 lb

## 2018-05-03 DIAGNOSIS — R9439 Abnormal result of other cardiovascular function study: Secondary | ICD-10-CM

## 2018-05-03 DIAGNOSIS — I10 Essential (primary) hypertension: Secondary | ICD-10-CM | POA: Diagnosis not present

## 2018-05-03 DIAGNOSIS — E782 Mixed hyperlipidemia: Secondary | ICD-10-CM

## 2018-05-03 DIAGNOSIS — Z01812 Encounter for preprocedural laboratory examination: Secondary | ICD-10-CM | POA: Diagnosis not present

## 2018-05-03 DIAGNOSIS — Z720 Tobacco use: Secondary | ICD-10-CM | POA: Diagnosis not present

## 2018-05-03 DIAGNOSIS — E1165 Type 2 diabetes mellitus with hyperglycemia: Secondary | ICD-10-CM

## 2018-05-03 NOTE — Telephone Encounter (Signed)
Pre-cert Verification for the following procedure   Left heart cath - Wednesday, 10/9 - 10:30 - End

## 2018-05-03 NOTE — Patient Instructions (Signed)
Medication Instructions:  Continue all current medications.  Labwork: BMET, CBC - orders given today   Testing/Procedures: Your physician has requested that you have a cardiac catheterization. Cardiac catheterization is used to diagnose and/or treat various heart conditions. Doctors may recommend this procedure for a number of different reasons. The most common reason is to evaluate chest pain. Chest pain can be a symptom of coronary artery disease (CAD), and cardiac catheterization can show whether plaque is narrowing or blocking your heart's arteries. This procedure is also used to evaluate the valves, as well as measure the blood flow and oxygen levels in different parts of your heart. For further information please visit www.cardiosmart.org. Please follow instruction sheet, as given.   Follow-Up: 1 month   Any Other Special Instructions Will Be Listed Below (If Applicable).   If you need a refill on your cardiac medications before your next appointment, please call your pharmacy.  

## 2018-05-04 ENCOUNTER — Encounter: Payer: Self-pay | Admitting: *Deleted

## 2018-05-04 ENCOUNTER — Encounter (HOSPITAL_COMMUNITY): Payer: Self-pay | Admitting: Internal Medicine

## 2018-05-06 ENCOUNTER — Ambulatory Visit (HOSPITAL_COMMUNITY)
Admission: RE | Admit: 2018-05-06 | Discharge: 2018-05-06 | Disposition: A | Discharge status: Home / Self Care | Payer: Medicaid Other | Source: Ambulatory Visit | Attending: Internal Medicine | Admitting: Internal Medicine

## 2018-05-06 DIAGNOSIS — I700 Atherosclerosis of aorta: Secondary | ICD-10-CM | POA: Insufficient documentation

## 2018-05-06 DIAGNOSIS — K449 Diaphragmatic hernia without obstruction or gangrene: Secondary | ICD-10-CM | POA: Diagnosis not present

## 2018-05-06 DIAGNOSIS — K5730 Diverticulosis of large intestine without perforation or abscess without bleeding: Secondary | ICD-10-CM | POA: Diagnosis not present

## 2018-05-06 DIAGNOSIS — R634 Abnormal weight loss: Secondary | ICD-10-CM | POA: Diagnosis present

## 2018-05-06 LAB — POCT I-STAT CREATININE: Creatinine, Ser: 0.5 mg/dL (ref 0.44–1.00)

## 2018-05-06 MED ORDER — IOPAMIDOL (ISOVUE-300) INJECTION 61%
100.0000 mL | Freq: Once | INTRAVENOUS | Status: AC | PRN
  Administered 2018-05-06: 100 mL via INTRAVENOUS

## 2018-05-07 LAB — BASIC METABOLIC PANEL
BUN: 9 mg/dL (ref 7–25)
CO2: 26 mmol/L (ref 20–32)
CREATININE: 0.61 mg/dL (ref 0.50–1.05)
Calcium: 9.5 mg/dL (ref 8.6–10.4)
Chloride: 97 mmol/L — ABNORMAL LOW (ref 98–110)
GLUCOSE: 228 mg/dL — AB (ref 65–139)
Potassium: 4.8 mmol/L (ref 3.5–5.3)
Sodium: 136 mmol/L (ref 135–146)

## 2018-05-07 LAB — CBC
HEMATOCRIT: 45.2 % — AB (ref 35.0–45.0)
HEMOGLOBIN: 14.9 g/dL (ref 11.7–15.5)
MCH: 29 pg (ref 27.0–33.0)
MCHC: 33 g/dL (ref 32.0–36.0)
MCV: 88.1 fL (ref 80.0–100.0)
MPV: 11 fL (ref 7.5–12.5)
Platelets: 292 10*3/uL (ref 140–400)
RBC: 5.13 10*6/uL — AB (ref 3.80–5.10)
RDW: 12.1 % (ref 11.0–15.0)
WBC: 12.1 10*3/uL — ABNORMAL HIGH (ref 3.8–10.8)

## 2018-05-11 ENCOUNTER — Telehealth: Payer: Self-pay | Admitting: *Deleted

## 2018-05-11 NOTE — Telephone Encounter (Signed)
I discussed instructions with patient, she verbalized understanding, thanked me for call.  

## 2018-05-11 NOTE — Telephone Encounter (Signed)
Pt contacted pre-catheterization scheduled at [facility] for: Wednesday May 12, 2018 10:30 AM Verified arrival time and place: [place] Entrance A at: 8:30 AM  No solid food after midnight prior to cath, clear liquids until 5 AM day of procedure. Verify allergies in Epic  Hold: Metformin-day of procedure and 48 hours post procedure.  Except hold medication AM meds can be  taken pre-cath with sip of water including: ASA 81 mg  Confirm patient has responsible person to drive home post procedure and for 24 hours after you arrive home.   LMTCB to discuss instructions with patient.

## 2018-05-12 ENCOUNTER — Other Ambulatory Visit: Payer: Self-pay

## 2018-05-12 ENCOUNTER — Ambulatory Visit (HOSPITAL_COMMUNITY)
Admission: RE | Disposition: A | Discharge status: Home / Self Care | Payer: Self-pay | Source: Ambulatory Visit | Attending: Internal Medicine

## 2018-05-12 ENCOUNTER — Ambulatory Visit (HOSPITAL_COMMUNITY)
Admission: RE | Admit: 2018-05-12 | Discharge: 2018-05-12 | Disposition: A | Discharge status: Home / Self Care | Payer: Medicaid Other | Source: Ambulatory Visit | Attending: Internal Medicine | Admitting: Internal Medicine

## 2018-05-12 DIAGNOSIS — Z7984 Long term (current) use of oral hypoglycemic drugs: Secondary | ICD-10-CM | POA: Insufficient documentation

## 2018-05-12 DIAGNOSIS — Z90710 Acquired absence of both cervix and uterus: Secondary | ICD-10-CM | POA: Diagnosis not present

## 2018-05-12 DIAGNOSIS — I25110 Atherosclerotic heart disease of native coronary artery with unstable angina pectoris: Secondary | ICD-10-CM | POA: Insufficient documentation

## 2018-05-12 DIAGNOSIS — J449 Chronic obstructive pulmonary disease, unspecified: Secondary | ICD-10-CM | POA: Diagnosis not present

## 2018-05-12 DIAGNOSIS — D125 Benign neoplasm of sigmoid colon: Secondary | ICD-10-CM | POA: Diagnosis not present

## 2018-05-12 DIAGNOSIS — Z8249 Family history of ischemic heart disease and other diseases of the circulatory system: Secondary | ICD-10-CM | POA: Insufficient documentation

## 2018-05-12 DIAGNOSIS — I10 Essential (primary) hypertension: Secondary | ICD-10-CM | POA: Diagnosis not present

## 2018-05-12 DIAGNOSIS — Z7982 Long term (current) use of aspirin: Secondary | ICD-10-CM | POA: Diagnosis not present

## 2018-05-12 DIAGNOSIS — I200 Unstable angina: Secondary | ICD-10-CM | POA: Diagnosis present

## 2018-05-12 DIAGNOSIS — E1165 Type 2 diabetes mellitus with hyperglycemia: Secondary | ICD-10-CM | POA: Diagnosis not present

## 2018-05-12 DIAGNOSIS — Z885 Allergy status to narcotic agent status: Secondary | ICD-10-CM | POA: Diagnosis not present

## 2018-05-12 DIAGNOSIS — F17210 Nicotine dependence, cigarettes, uncomplicated: Secondary | ICD-10-CM | POA: Insufficient documentation

## 2018-05-12 DIAGNOSIS — Z79899 Other long term (current) drug therapy: Secondary | ICD-10-CM | POA: Diagnosis not present

## 2018-05-12 DIAGNOSIS — I2584 Coronary atherosclerosis due to calcified coronary lesion: Secondary | ICD-10-CM | POA: Diagnosis not present

## 2018-05-12 DIAGNOSIS — E782 Mixed hyperlipidemia: Secondary | ICD-10-CM | POA: Insufficient documentation

## 2018-05-12 DIAGNOSIS — R9439 Abnormal result of other cardiovascular function study: Secondary | ICD-10-CM | POA: Diagnosis not present

## 2018-05-12 HISTORY — PX: INTRAVASCULAR PRESSURE WIRE/FFR STUDY: CATH118243

## 2018-05-12 HISTORY — PX: LEFT HEART CATH AND CORONARY ANGIOGRAPHY: CATH118249

## 2018-05-12 LAB — GLUCOSE, CAPILLARY
GLUCOSE-CAPILLARY: 279 mg/dL — AB (ref 70–99)
GLUCOSE-CAPILLARY: 206 mg/dL — AB (ref 70–99)

## 2018-05-12 LAB — POCT ACTIVATED CLOTTING TIME
Activated Clotting Time: 268 seconds
Activated Clotting Time: 285 seconds

## 2018-05-12 SURGERY — LEFT HEART CATH AND CORONARY ANGIOGRAPHY
Anesthesia: LOCAL

## 2018-05-12 MED ORDER — ACETAMINOPHEN 325 MG PO TABS
650.0000 mg | ORAL_TABLET | ORAL | Status: DC | PRN
  Administered 2018-05-12: 650 mg via ORAL
  Filled 2018-05-12: qty 2

## 2018-05-12 MED ORDER — FENTANYL CITRATE (PF) 100 MCG/2ML IJ SOLN
INTRAMUSCULAR | Status: AC
  Filled 2018-05-12: qty 2

## 2018-05-12 MED ORDER — HEPARIN (PORCINE) IN NACL 1000-0.9 UT/500ML-% IV SOLN
INTRAVENOUS | Status: AC
  Filled 2018-05-12: qty 1500

## 2018-05-12 MED ORDER — METFORMIN HCL 500 MG PO TABS: 500.0000 mg | ORAL_TABLET | Freq: Two times a day (BID) | ORAL | Status: DC

## 2018-05-12 MED ORDER — NITROGLYCERIN 1 MG/10 ML FOR IR/CATH LAB
INTRA_ARTERIAL | Status: AC
  Filled 2018-05-12: qty 10

## 2018-05-12 MED ORDER — SODIUM CHLORIDE 0.9% FLUSH: 3.0000 mL | INTRAVENOUS | Status: DC | PRN

## 2018-05-12 MED ORDER — SODIUM CHLORIDE 0.9 % WEIGHT BASED INFUSION
3.0000 mL/kg/h | INTRAVENOUS | Status: AC
  Administered 2018-05-12: 3 mL/kg/h via INTRAVENOUS

## 2018-05-12 MED ORDER — ASPIRIN 81 MG PO CHEW: 81.0000 mg | CHEWABLE_TABLET | Freq: Every day | ORAL | Status: DC

## 2018-05-12 MED ORDER — ADENOSINE 12 MG/4ML IV SOLN
INTRAVENOUS | Status: AC
  Filled 2018-05-12: qty 16

## 2018-05-12 MED ORDER — MIDAZOLAM HCL 2 MG/2ML IJ SOLN
INTRAMUSCULAR | Status: DC | PRN
  Administered 2018-05-12: 1 mg via INTRAVENOUS

## 2018-05-12 MED ORDER — ADENOSINE (DIAGNOSTIC) 140MCG/KG/MIN
INTRAVENOUS | Status: DC | PRN
  Administered 2018-05-12: 140 ug/kg/min via INTRAVENOUS

## 2018-05-12 MED ORDER — SODIUM CHLORIDE 0.9% FLUSH: 3.0000 mL | Freq: Two times a day (BID) | INTRAVENOUS | Status: DC

## 2018-05-12 MED ORDER — HEPARIN SODIUM (PORCINE) 1000 UNIT/ML IJ SOLN
INTRAMUSCULAR | Status: DC | PRN
  Administered 2018-05-12 (×2): 4000 [IU] via INTRAVENOUS
  Administered 2018-05-12: 2000 [IU] via INTRAVENOUS

## 2018-05-12 MED ORDER — FENTANYL CITRATE (PF) 100 MCG/2ML IJ SOLN
INTRAMUSCULAR | Status: DC | PRN
  Administered 2018-05-12: 25 ug via INTRAVENOUS

## 2018-05-12 MED ORDER — LABETALOL HCL 5 MG/ML IV SOLN: 10.0000 mg | INTRAVENOUS | Status: DC | PRN

## 2018-05-12 MED ORDER — VERAPAMIL HCL 2.5 MG/ML IV SOLN
INTRAVENOUS | Status: DC | PRN
  Administered 2018-05-12: 10 mL via INTRA_ARTERIAL

## 2018-05-12 MED ORDER — ASPIRIN 81 MG PO CHEW: 81.0000 mg | CHEWABLE_TABLET | ORAL | Status: DC

## 2018-05-12 MED ORDER — SODIUM CHLORIDE 0.9 % IV SOLN: 250.0000 mL | INTRAVENOUS | Status: DC | PRN

## 2018-05-12 MED ORDER — SODIUM CHLORIDE 0.9 % IV SOLN: INTRAVENOUS | Status: DC

## 2018-05-12 MED ORDER — ISOSORBIDE MONONITRATE ER 30 MG PO TB24: 30.0000 mg | ORAL_TABLET | Freq: Every day | ORAL | 5 refills | Status: DC

## 2018-05-12 MED ORDER — LIDOCAINE HCL (PF) 1 % IJ SOLN
INTRAMUSCULAR | Status: DC | PRN
  Administered 2018-05-12: 2 mL

## 2018-05-12 MED ORDER — HEPARIN (PORCINE) IN NACL 1000-0.9 UT/500ML-% IV SOLN
INTRAVENOUS | Status: DC | PRN
  Administered 2018-05-12 (×2): 500 mL

## 2018-05-12 MED ORDER — SODIUM CHLORIDE 0.9 % WEIGHT BASED INFUSION: 1.0000 mL/kg/h | INTRAVENOUS | Status: DC

## 2018-05-12 MED ORDER — MIDAZOLAM HCL 2 MG/2ML IJ SOLN
INTRAMUSCULAR | Status: AC
  Filled 2018-05-12: qty 2

## 2018-05-12 MED ORDER — HYDRALAZINE HCL 20 MG/ML IJ SOLN: 5.0000 mg | INTRAMUSCULAR | Status: DC | PRN

## 2018-05-12 MED ORDER — ONDANSETRON HCL 4 MG/2ML IJ SOLN: 4.0000 mg | Freq: Four times a day (QID) | INTRAMUSCULAR | Status: DC | PRN

## 2018-05-12 MED ORDER — VERAPAMIL HCL 2.5 MG/ML IV SOLN
INTRAVENOUS | Status: AC
  Filled 2018-05-12: qty 2

## 2018-05-12 MED ORDER — IOHEXOL 350 MG/ML SOLN
INTRAVENOUS | Status: DC | PRN
  Administered 2018-05-12: 80 mL via INTRA_ARTERIAL

## 2018-05-12 MED ORDER — LIDOCAINE HCL (PF) 1 % IJ SOLN
INTRAMUSCULAR | Status: AC
  Filled 2018-05-12: qty 30

## 2018-05-12 SURGICAL SUPPLY — 14 items
CATH 5FR JL3.5 JR4 ANG PIG MP (CATHETERS) ×1 IMPLANT
CATH LAUNCHER 6FR EBU 3 (CATHETERS) ×1 IMPLANT
CATH LAUNCHER 6FR JR4 (CATHETERS) ×1 IMPLANT
DEVICE RAD COMP TR BAND LRG (VASCULAR PRODUCTS) ×1 IMPLANT
GLIDESHEATH SLEND SS 6F .021 (SHEATH) ×1 IMPLANT
GUIDEWIRE INQWIRE 1.5J.035X260 (WIRE) IMPLANT
GUIDEWIRE PRESSURE COMET II (WIRE) ×1 IMPLANT
INQWIRE 1.5J .035X260CM (WIRE) ×2
KIT ESSENTIALS PG (KITS) ×1 IMPLANT
KIT HEART LEFT (KITS) ×2 IMPLANT
PACK CARDIAC CATHETERIZATION (CUSTOM PROCEDURE TRAY) ×2 IMPLANT
SYR MEDRAD MARK V 150ML (SYRINGE) ×2 IMPLANT
TRANSDUCER W/STOPCOCK (MISCELLANEOUS) ×2 IMPLANT
TUBING CIL FLEX 10 FLL-RA (TUBING) ×2 IMPLANT

## 2018-05-12 NOTE — Research (Signed)
CADFEM Informed Consent           Subject Name:  Lauren David. Kolodziej   Subject met inclusion and exclusion criteria.  The informed consent form, study requirements and expectations were reviewed with the subject and questions and concerns were addressed prior to the signing of the consent form.  The subject verbalized understanding of the trial requirements.  The subject agreed to participate in the CADFEM trial and signed the informed consent.  The informed consent was obtained prior to performance of any protocol-specific procedures for the subject.  A copy of the signed informed consent was given to the subject and a copy was placed in the subject's medical record.   [place], Research Assistant 05/12/2018     09:00 a.m.

## 2018-05-12 NOTE — Brief Op Note (Signed)
BRIEF CARDIAC CATHETERIZATION NOTE  DATE: 05/12/2018 TIME: 1:07 PM  PATIENT:  Lauren David  57 y.o. female  PRE-OPERATIVE DIAGNOSIS:  Accelerating angina  POST-OPERATIVE DIAGNOSIS:  Same  PROCEDURE:  Procedure(s): LEFT HEART CATH AND CORONARY ANGIOGRAPHY (N/A) INTRAVASCULAR PRESSURE WIRE/FFR STUDY (N/A)  SURGEON:  Surgeon(s) and Role:    * Ellicia Alix, MD - Primary  FINDINGS: 1. Moderate 3-vessel CAD.  FFR of LAD (0.81) and RCA (0.80) are borderline significant. 2. Normal LVEF.  Upper normal LVEDP.  RECOMMENDATIONS: 1. Medical therapy; will add isosorbide mononitrate. 2. Aggressive secondary prevention, including tobacco cessation and improved control of diabetes.  Nelva Bush, MD [facility] Pager: [phone]

## 2018-05-12 NOTE — Progress Notes (Signed)
RN spoke with patient about medications for discharge

## 2018-05-12 NOTE — Discharge Instructions (Signed)

## 2018-05-12 NOTE — Interval H&P Note (Signed)
History and Physical Interval Note:  05/12/2018 11:31 AM  Lauren David  has presented today for cardiac catheterization, with the diagnosis of accelerating angina.The various methods of treatment have been discussed with the patient and family. After consideration of risks, benefits and other options for treatment, the patient has consented to  Procedure(s): LEFT HEART CATH AND CORONARY ANGIOGRAPHY (N/A) as a surgical intervention .  The patient's history has been reviewed, patient examined, no change in status, stable for surgery.  I have reviewed the patient's chart and labs.  Questions were answered to the patient's satisfaction.    Cath Lab Visit (complete for each Cath Lab visit)  Clinical Evaluation Leading to the Procedure:   ACS: No.  Non-ACS:    Anginal Classification: CCS III  Anti-ischemic medical therapy: Minimal Therapy (1 class of medications)  Non-Invasive Test Results: Intermediate-risk stress test findings: cardiac mortality 1-3%/year  Prior CABG: No previous CABG  Lauren David

## 2018-05-13 ENCOUNTER — Encounter (HOSPITAL_COMMUNITY): Payer: Self-pay | Admitting: Internal Medicine

## 2018-05-13 MED FILL — Nitroglycerin IV Soln 100 MCG/ML in D5W: INTRA_ARTERIAL | Qty: 10 | Status: AC

## 2018-05-14 ENCOUNTER — Encounter (HOSPITAL_COMMUNITY): Payer: Self-pay | Admitting: Internal Medicine

## 2018-05-17 ENCOUNTER — Ambulatory Visit: Payer: Medicaid Other | Admitting: Cardiology

## 2018-05-20 ENCOUNTER — Telehealth: Payer: Self-pay | Admitting: *Deleted

## 2018-05-20 NOTE — Telephone Encounter (Signed)
Patient c/o having headaches since starting imdur after d/c from heart cath. Patient says that the headaches starts 2 hours after taking it and lasts for about 16 hours. Headaches rated 5-6/10. Has been taking Tylenol 1000 mg BID with no relief. Patient advised to break the imdur 30 mg in half daily, taking it at bedtime, taking tylenol 1000 mg every 8 hours and contact our office back if the headaches continue. Verbalized understanding of plan.

## 2018-06-14 ENCOUNTER — Encounter: Payer: Self-pay | Admitting: Cardiology

## 2018-06-14 NOTE — Progress Notes (Signed)
Cardiology Office Note  Date: 06/16/2018   ID: Abi, Shoults 07-29-1961, MRN [id number]  PCP: Sinda Du, MD  Primary Cardiologist: Rozann Lesches, MD   Chief Complaint  Patient presents with  . Coronary Artery Disease    History of Present Illness: Lauren David is a 57 y.o. female last seen in September.  She was referred at that time for a diagnostic cardiac catheterization which was performed by Dr. Saunders Revel.  Procedure revealed moderate to severe three-vessel disease as detailed below with plan to manage medically unless symptoms worsen at which point surgical consultation could be considered for revascularization.  She presents today for a follow-up visit.  She did have trouble with headache on Imdur, dose was cut from 30 mg daily to 15 mg daily.  She states that this has not helped and she does not want to stay on this medication.  I reviewed her current regimen we discussed considering the addition of low-dose Norvasc instead as an antianginal.  We would concurrently reduce her lisinopril dose to avoid hypotension.  Past Medical History:  Diagnosis Date  . Anxiety   . CAD (coronary artery disease)    Multivessel disease 05/2018  . Chronic back pain   . COPD (chronic obstructive pulmonary disease) ([place])   . Depression   . Essential hypertension   . Hyperlipidemia   . Type 2 diabetes mellitus ([place])     Past Surgical History:  Procedure Laterality Date  . COLONOSCOPY N/A 04/26/2018   Procedure: COLONOSCOPY;  Surgeon: Rogene Houston, MD;  [facility];  Service: Endoscopy;  Laterality: N/A;  1:15  . INTRAVASCULAR PRESSURE WIRE/FFR STUDY N/A 05/12/2018   Procedure: INTRAVASCULAR PRESSURE WIRE/FFR STUDY;  Surgeon: Nelva Bush, MD;  Location: [facility];  Service: Cardiovascular;  Laterality: N/A;  . LEFT HEART CATH AND CORONARY ANGIOGRAPHY N/A 05/12/2018   Procedure: LEFT HEART CATH AND CORONARY ANGIOGRAPHY;  Surgeon: Nelva Bush, MD;   Location: [facility];  Service: Cardiovascular;  Laterality: N/A;  . POLYPECTOMY  04/26/2018   Procedure: POLYPECTOMY;  Surgeon: Rogene Houston, MD;  [facility];  Service: Endoscopy;;  colon   . TOTAL VAGINAL HYSTERECTOMY      Current Outpatient Medications  Medication Sig Dispense Refill  . albuterol (PROVENTIL HFA;VENTOLIN HFA) 108 (90 Base) MCG/ACT inhaler Inhale 2 puffs into the lungs every 4 (four) hours as needed for wheezing or shortness of breath.    Marland Kitchen aspirin EC 81 MG tablet Take 1 tablet (81 mg total) by mouth daily. 90 tablet 3  . bisoprolol (ZEBETA) 5 MG tablet Take 0.5 tablets (2.5 mg total) by mouth daily. 45 tablet 3  . dicyclomine (BENTYL) 10 MG capsule Take 1 capsule (10 mg total) by mouth 3 (three) times daily as needed. 60 capsule 1  . metFORMIN (GLUCOPHAGE) 500 MG tablet Take 1 tablet (500 mg total) by mouth 2 (two) times daily with a meal.    . pravastatin (PRAVACHOL) 40 MG tablet Take 40 mg by mouth at bedtime.     Marland Kitchen tiotropium (SPIRIVA) 18 MCG inhalation capsule Place 18 mcg into inhaler and inhale daily.    . varenicline (CHANTIX STARTING MONTH PAK) 0.5 MG X 11 & 1 MG X 42 tablet Take one 0.5 mg tablet by mouth once daily for 3 days, then increase to one 0.5 mg tablet twice daily for 4 days, then increase to one 1 mg tablet twice daily. 53 tablet 0  .  amLODipine (NORVASC) 2.5 MG tablet Take 1 tablet (2.5 mg total) by mouth every evening. 90 tablet 3  . lisinopril (PRINIVIL,ZESTRIL) 2.5 MG tablet Take 1 tablet (2.5 mg total) by mouth daily. 90 tablet 3  . nitroGLYCERIN (NITROSTAT) 0.4 MG SL tablet Place 1 tablet (0.4 mg total) under the tongue every 5 (five) minutes x 3 doses as needed for chest pain (if no relief after 3rd dose, proceed to the ED for an evaluation). 25 tablet 3   No current facility-administered medications for this visit.    Allergies:  Percocet [oxycodone-acetaminophen]   Social History: The patient  reports that she has been  smoking cigarettes. She has never used smokeless tobacco. She reports that she does not drink alcohol or use drugs.   ROS:  Please see the history of present illness. Otherwise, complete review of systems is positive for chronic dyspnea on exertion.  All other systems are reviewed and negative.   Physical Exam: VS:  BP 128/70   Pulse 77   Ht 5\' 1"  (1.549 m)   Wt 180 lb 9.6 oz (81.9 kg)   SpO2 96%   BMI 34.12 kg/m , BMI Body mass index is 34.12 kg/m.  Wt Readings from Last 3 Encounters:  06/16/18 180 lb 9.6 oz (81.9 kg)  05/12/18 170 lb (77.1 kg)  05/03/18 178 lb (80.7 kg)    General: Patient appears comfortable at rest. HEENT: Conjunctiva and lids normal, oropharynx clear. Neck: Supple, no elevated JVP or carotid bruits, no thyromegaly. Lungs: Clear to auscultation, nonlabored breathing at rest. Cardiac: Regular rate and rhythm, no S3 or significant systolic murmur. Abdomen: Soft, nontender, bowel sounds present. Extremities: No pitting edema, distal pulses 2+. Skin: Warm and dry. Musculoskeletal: No kyphosis. Neuropsychiatric: Alert and oriented x3, affect grossly appropriate.  ECG: I personally reviewed the tracing from 05/12/2018 which showed normal sinus rhythm.  Recent Labwork: 05/06/2018: BUN 9; Creat 0.61; Hemoglobin 14.9; Platelets 292; Potassium 4.8; Sodium 136   Other Studies Reviewed Today:  Cardiac catheterization 05/12/2018: Conclusions: 1. Moderate to severe 3-vessel coronary artery disease, including 50% proximal LAD, 70% mid LCx, and 60% mid RCA stenoses.  FFR of LAD and RCA lesions is borderline positive (FFR 0.80-0.81).  Mid/distal LCx is small and supplies a relatively small territory. 2. Mildly elevated left ventricular filling pressure with normal left ventricular systolic function.  Recommendations: 1. Aggressive medical therapy.  I will add isosorbide mononitrate 30 mg daily.  Tobacco cessation and better control of her diabetes are critical. 2. If  patient has persistent symptoms and/or worsening of her coronary artery disease, surgical consultation would need to be considered, given three-vessel disease involving the proximal LAD and the patient's history of diabetes.  However, I am not sure that the distal LCx is graftable, given its relatively small size.  Lexiscan Myoview 03/25/2018:  There was no ST segment deviation noted during stress.  Findings consistent with prior anterior/ansteroseptal myocardial infarction with mild to moderate peri-infarct ischemia.  This is a low to intermediate risk study. TID is elevated at 1.55, which may suggest higher risk.  The left ventricular ejection fraction is normal (55-65%).  Assessment and Plan:  1.  Multivessel CAD as outlined above in the setting of type 2 diabetes mellitus.  We will attempt adjustments in medical therapy for now.  She did not tolerate Imdur due to headaches despite cutting back dose.  Plan to discontinue Imdur and start Norvasc at 2.5 mg once in the evening, concurrently cut back lisinopril to 2.5  mg daily to avoid hypotension.  Also prescription provided for as needed nitroglycerin.  2.  Tobacco abuse with COPD.  She is trying to quit with use of Chantix.  3.  Uncontrolled type 2 diabetes mellitus.  She has a follow-up visit with Dr. Luan Pulling scheduled in early December.  4.  Mixed hyperlipidemia on Pravachol.  Current medicines were reviewed with the patient today.  Disposition: Follow-up in 3 months.  Signed, Satira Sark, MD, [facility] 06/16/2018 1:42 PM    [facility] [place], [place], [place] [address] Phone: [phone]; Fax: [phone]

## 2018-06-16 ENCOUNTER — Encounter: Payer: Self-pay | Admitting: Cardiology

## 2018-06-16 ENCOUNTER — Ambulatory Visit (INDEPENDENT_AMBULATORY_CARE_PROVIDER_SITE_OTHER): Payer: Medicaid Other | Admitting: Cardiology

## 2018-06-16 VITALS — BP 128/70 | HR 77 | Ht 61.0 in | Wt 180.6 lb

## 2018-06-16 DIAGNOSIS — E1165 Type 2 diabetes mellitus with hyperglycemia: Secondary | ICD-10-CM

## 2018-06-16 DIAGNOSIS — I25119 Atherosclerotic heart disease of native coronary artery with unspecified angina pectoris: Secondary | ICD-10-CM

## 2018-06-16 DIAGNOSIS — E782 Mixed hyperlipidemia: Secondary | ICD-10-CM

## 2018-06-16 DIAGNOSIS — Z720 Tobacco use: Secondary | ICD-10-CM

## 2018-06-16 MED ORDER — AMLODIPINE BESYLATE 2.5 MG PO TABS: 2.5000 mg | ORAL_TABLET | Freq: Every evening | ORAL | 3 refills | Status: DC

## 2018-06-16 MED ORDER — NITROGLYCERIN 0.4 MG SL SUBL: 0.4000 mg | SUBLINGUAL_TABLET | SUBLINGUAL | 3 refills | Status: DC | PRN

## 2018-06-16 MED ORDER — LISINOPRIL 2.5 MG PO TABS: 2.5000 mg | ORAL_TABLET | Freq: Every day | ORAL | 3 refills | Status: DC

## 2018-06-16 NOTE — Patient Instructions (Addendum)
Medication Instructions:   Your physician has recommended you make the following change in your medication:   Stop isosorbide mononitrate (imdur)  Start amlodipine 2.5 mg by mouth daily in the evening  Decrease lisinopril to 2.5 mg by mouth daily.  Start nitroglycerin 0.4 mg under tongue for chest pain every 5 minutes up to 3 doses. If no relief after 3rd dose proceed to the ED for an evaluation.  Continue all other medications the same.  Labwork:  NONE  Testing/Procedures:  NONE  Follow-Up:  Your physician recommends that you schedule a follow-up appointment in: 3 months.  Any Other Special Instructions Will Be Listed Below (If Applicable).  If you need a refill on your cardiac medications before your next appointment, please call your pharmacy.

## 2018-07-08 ENCOUNTER — Ambulatory Visit (HOSPITAL_COMMUNITY)
Admission: RE | Admit: 2018-07-08 | Discharge: 2018-07-08 | Disposition: A | Discharge status: Home / Self Care | Payer: Medicaid Other | Source: Ambulatory Visit | Attending: Pulmonary Disease | Admitting: Pulmonary Disease

## 2018-07-08 ENCOUNTER — Other Ambulatory Visit (HOSPITAL_COMMUNITY): Payer: Self-pay | Admitting: Pulmonary Disease

## 2018-07-08 DIAGNOSIS — M542 Cervicalgia: Secondary | ICD-10-CM

## 2018-07-14 ENCOUNTER — Other Ambulatory Visit: Payer: Self-pay | Admitting: Cardiology

## 2018-09-07 ENCOUNTER — Ambulatory Visit (HOSPITAL_COMMUNITY)
Admission: RE | Admit: 2018-09-07 | Discharge: 2018-09-07 | Disposition: A | Discharge status: Home / Self Care | Payer: Medicaid Other | Source: Ambulatory Visit | Attending: Pulmonary Disease | Admitting: Pulmonary Disease

## 2018-09-07 ENCOUNTER — Other Ambulatory Visit (HOSPITAL_COMMUNITY): Payer: Self-pay | Admitting: Pulmonary Disease

## 2018-09-07 DIAGNOSIS — M549 Dorsalgia, unspecified: Secondary | ICD-10-CM

## 2018-09-21 ENCOUNTER — Ambulatory Visit: Payer: Medicaid Other | Admitting: Cardiology

## 2018-09-21 ENCOUNTER — Encounter: Payer: Self-pay | Admitting: Cardiology

## 2018-09-21 VITALS — BP 142/74 | HR 74 | Ht 61.0 in | Wt 170.0 lb

## 2018-09-21 DIAGNOSIS — Z720 Tobacco use: Secondary | ICD-10-CM | POA: Diagnosis not present

## 2018-09-21 DIAGNOSIS — E1165 Type 2 diabetes mellitus with hyperglycemia: Secondary | ICD-10-CM | POA: Diagnosis not present

## 2018-09-21 DIAGNOSIS — E782 Mixed hyperlipidemia: Secondary | ICD-10-CM

## 2018-09-21 DIAGNOSIS — I25119 Atherosclerotic heart disease of native coronary artery with unspecified angina pectoris: Secondary | ICD-10-CM | POA: Diagnosis not present

## 2018-09-21 MED ORDER — AMLODIPINE BESYLATE 5 MG PO TABS: 5.0000 mg | ORAL_TABLET | Freq: Every day | ORAL | 3 refills | Status: DC

## 2018-09-21 NOTE — Progress Notes (Signed)
Cardiology Office Note  Date: 09/21/2018   ID: Lauren, David 04-24-1961, MRN [id number]  PCP: Sinda Du, MD  Primary Cardiologist: Rozann Lesches, MD   Chief Complaint  Patient presents with  . Coronary Artery Disease    History of Present Illness: Lauren David is a 58 y.o. female last seen in November 2019.  She is here for a follow-up visit.  Still reports intermittent angina symptoms, but did not tolerate Norvasc better than Imdur which she was on previously.  Lisinopril has been increased back to 5 mg daily by Dr. Luan Pulling.  We discussed further up titrating Norvasc as tolerated.  Remainder of her cardiac regimen is stable and outlined below.  She reports compliance.  We did talk about a walking plan for exercise today.  Past Medical History:  Diagnosis Date  . Anxiety   . CAD (coronary artery disease)    Multivessel disease 05/2018  . Chronic back pain   . COPD (chronic obstructive pulmonary disease) ([place])   . Depression   . Essential hypertension   . Hyperlipidemia   . Type 2 diabetes mellitus ([place])     Past Surgical History:  Procedure Laterality Date  . COLONOSCOPY N/A 04/26/2018   Procedure: COLONOSCOPY;  Surgeon: Rogene Houston, MD;  [facility];  Service: Endoscopy;  Laterality: N/A;  1:15  . INTRAVASCULAR PRESSURE WIRE/FFR STUDY N/A 05/12/2018   Procedure: INTRAVASCULAR PRESSURE WIRE/FFR STUDY;  Surgeon: Nelva Bush, MD;  Location: [facility];  Service: Cardiovascular;  Laterality: N/A;  . LEFT HEART CATH AND CORONARY ANGIOGRAPHY N/A 05/12/2018   Procedure: LEFT HEART CATH AND CORONARY ANGIOGRAPHY;  Surgeon: Nelva Bush, MD;  Location: [facility];  Service: Cardiovascular;  Laterality: N/A;  . POLYPECTOMY  04/26/2018   Procedure: POLYPECTOMY;  Surgeon: Rogene Houston, MD;  [facility];  Service: Endoscopy;;  colon   . TOTAL VAGINAL HYSTERECTOMY      Current Outpatient Medications  Medication  Sig Dispense Refill  . albuterol (PROVENTIL HFA;VENTOLIN HFA) 108 (90 Base) MCG/ACT inhaler Inhale 2 puffs into the lungs every 4 (four) hours as needed for wheezing or shortness of breath.    Marland Kitchen aspirin EC 81 MG tablet Take 1 tablet (81 mg total) by mouth daily. 90 tablet 3  . bisoprolol (ZEBETA) 5 MG tablet Take 0.5 tablets (2.5 mg total) by mouth daily. 45 tablet 3  . dicyclomine (BENTYL) 10 MG capsule Take 1 capsule (10 mg total) by mouth 3 (three) times daily as needed. 60 capsule 1  . Empagliflozin (JARDIANCE PO) Take by mouth.    Marland Kitchen lisinopril (PRINIVIL,ZESTRIL) 5 MG tablet Take 5 mg by mouth daily.    . metFORMIN (GLUCOPHAGE) 500 MG tablet Take 1 tablet (500 mg total) by mouth 2 (two) times daily with a meal.    . nitroGLYCERIN (NITROSTAT) 0.4 MG SL tablet Place 1 tablet (0.4 mg total) under the tongue every 5 (five) minutes x 3 doses as needed for chest pain (if no relief after 3rd dose, proceed to the ED for an evaluation). 25 tablet 3  . pravastatin (PRAVACHOL) 40 MG tablet Take 40 mg by mouth at bedtime.     Marland Kitchen tiotropium (SPIRIVA) 18 MCG inhalation capsule Place 18 mcg into inhaler and inhale daily.    . varenicline (CHANTIX STARTING MONTH PAK) 0.5 MG X 11 & 1 MG X 42 tablet USE AS DIRECTED 53 tablet 0  . amLODipine (NORVASC) 5 MG tablet Take  1 tablet (5 mg total) by mouth daily. 90 tablet 3   No current facility-administered medications for this visit.    Allergies:  Percocet [oxycodone-acetaminophen]   Social History: The patient  reports that she has been smoking cigarettes. She has never used smokeless tobacco. She reports that she does not drink alcohol or use drugs.   ROS:  Please see the history of present illness. Otherwise, complete review of systems is positive for arthritic pains.  All other systems are reviewed and negative.   Physical Exam: VS:  BP (!) 142/74   Pulse 74   Ht 5\' 1"  (1.549 m)   Wt 170 lb (77.1 kg)   SpO2 97%   BMI 32.12 kg/m , BMI Body mass index is  32.12 kg/m.  Wt Readings from Last 3 Encounters:  09/21/18 170 lb (77.1 kg)  06/16/18 180 lb 9.6 oz (81.9 kg)  05/12/18 170 lb (77.1 kg)    General: Patient appears comfortable at rest. HEENT: Conjunctiva and lids normal, oropharynx clear. Neck: Supple, no elevated JVP or carotid bruits, no thyromegaly. Lungs: Clear to auscultation, nonlabored breathing at rest. Cardiac: Regular rate and rhythm, no S3 or significant systolic murmur. Abdomen: Soft, nontender, bowel sounds present. Extremities: No pitting edema, distal pulses 2+. Skin: Warm and dry. Musculoskeletal: No kyphosis. Neuropsychiatric: Alert and oriented x3, affect grossly appropriate.  ECG: I personally reviewed the tracing from 05/12/2018 which showed normal sinus rhythm.  Recent Labwork: 05/06/2018: BUN 9; Creat 0.61; Hemoglobin 14.9; Platelets 292; Potassium 4.8; Sodium 136   Other Studies Reviewed Today:  Cardiac catheterization 05/12/2018: Conclusions: 1. Moderate to severe 3-vessel coronary artery disease, including 50% proximal LAD, 70% mid LCx, and 60% mid RCA stenoses. FFR of LAD and RCA lesions is borderline positive (FFR 0.80-0.81). Mid/distal LCx is small and supplies a relatively small territory. 2. Mildly elevated left ventricular filling pressure with normal left ventricular systolic function.  Recommendations: 1. Aggressive medical therapy. I will add isosorbide mononitrate 30 mg daily. Tobacco cessation and better control of her diabetes are critical. 2. If patient has persistent symptoms and/or worsening of her coronary artery disease, surgical consultation would need to be considered, given three-vessel disease involving the proximal LAD and the patient's history of diabetes. However, I am not sure that the distal LCx is graftable, given its relatively small size.  Lexiscan Myoview 03/25/2018:  There was no ST segment deviation noted during stress.  Findings consistent with prior  anterior/ansteroseptal myocardial infarction with mild to moderate peri-infarct ischemia.  This is a low to intermediate risk study. TID is elevated at 1.55, which may suggest higher risk.  The left ventricular ejection fraction is normal (55-65%).  Assessment and Plan:  1.  Multivessel CAD as outlined above, continue with medical therapy at this point.  She did not tolerate Imdur previously, doing well with Norvasc which we will uptitrate to 5 mg daily.  Continue remaining cardiac regimen.  Also discussed walking plan for exercise.  2.  Tobacco abuse with COPD.  She is trying to quit smoking with Chantix.  3.  Uncontrolled type 2 diabetes mellitus.  She is following with Dr. Luan Pulling.  4.  Mixed hyperlipidemia on Pravachol.  Current medicines were reviewed with the patient today.  Disposition: Follow-up in 4 months.  Signed, Satira Sark, MD, [facility] 09/21/2018 11:31 AM    [facility], [place], [place] [address] Phone: [phone]; Fax: [phone]

## 2018-09-21 NOTE — Patient Instructions (Addendum)
Medication Instructions:   Your physician has recommended you make the following change in your medication:   Increase amlodipine to 5 mg by mouth daily. You may take (2) of your 2.5 mg tablets daily until they are finished.   Continue all other medications the same  Labwork:  NONE  Testing/Procedures:  NONE  Follow-Up:  Your physician recommends that you schedule a follow-up appointment in: 4 months.   Any Other Special Instructions Will Be Listed Below (If Applicable).  If you need a refill on your cardiac medications before your next appointment, please call your pharmacy.

## 2019-01-18 ENCOUNTER — Telehealth: Payer: Self-pay | Admitting: Cardiology

## 2019-01-18 NOTE — Telephone Encounter (Signed)
Virtual Visit Pre-Appointment Phone Call  "(Name), I am calling you today to discuss your upcoming appointment. We are currently trying to limit exposure to the virus that causes COVID-19 by seeing patients at home rather than in the office."  1. "What is the BEST phone number to call the day of the visit?" - include this in appointment notes  2. Do you have or have access to (through a family member/friend) a smartphone with video capability that we can use for your visit?" a. If yes - list this number in appt notes as cell (if different from BEST phone #) and list the appointment type as a VIDEO visit in appointment notes b. If no - list the appointment type as a PHONE visit in appointment notes  3. Confirm consent - "In the setting of the current Covid19 crisis, you are scheduled for a (phone or video) visit with your provider on (date) at (time).  Just as we do with many in-office visits, in order for you to participate in this visit, we must obtain consent.  If you'd like, I can send this to your mychart (if signed up) or email for you to review.  Otherwise, I can obtain your verbal consent now.  All virtual visits are billed to your insurance company just like a normal visit would be.  By agreeing to a virtual visit, we'd like you to understand that the technology does not allow for your provider to perform an examination, and thus may limit your provider's ability to fully assess your condition. If your provider identifies any concerns that need to be evaluated in person, we will make arrangements to do so.  Finally, though the technology is pretty good, we cannot assure that it will always work on either your or our end, and in the setting of a video visit, we may have to convert it to a phone-only visit.  In either situation, we cannot ensure that we have a secure connection.  Are you willing to proceed?" STAFF: Did the patient verbally acknowledge consent to telehealth visit? Document  YES/NO here: yes  4. Advise patient to be prepared - "Two hours prior to your appointment, go ahead and check your blood pressure, pulse, oxygen saturation, and your weight (if you have the equipment to check those) and write them all down. When your visit starts, your provider will ask you for this information. If you have an Apple Watch or Kardia device, please plan to have heart rate information ready on the day of your appointment. Please have a pen and paper handy nearby the day of the visit as well."  5. Give patient instructions for MyChart download to smartphone OR [email or web address] as below if video visit (depending on what platform provider is using)  6. Inform patient they will receive a phone call 15 minutes prior to their appointment time (may be from unknown caller ID) so they should be prepared to answer    TELEPHONE CALL NOTE  Lauren David has been deemed a candidate for a follow-up tele-health visit to limit community exposure during the Covid-19 pandemic. I spoke with the patient via phone to ensure availability of phone/video source, confirm preferred email & phone number, and discuss instructions and expectations.  I reminded Lauren David to be prepared with any vital sign and/or heart rhythm information that could potentially be obtained via home monitoring, at the time of her visit. I reminded Lauren David to expect a phone call prior to  her visit.  Lauren David 01/18/2019 10:49 AM   INSTRUCTIONS FOR DOWNLOADING THE MYCHART APP TO SMARTPHONE  - The patient must first make sure to have activated MyChart and know their login information - If Apple, go to [employer] and type in MyChart in the search bar and download the app. If Android, ask patient to go to [employer] and type in [place] in the search bar and download the app. The app is free but as with any other app downloads, their phone may require them to verify saved payment information or Apple/Android  password.  - The patient will need to then log into the app with their MyChart username and password, and select  as their healthcare provider to link the account. When it is time for your visit, go to the MyChart app, find appointments, and click Begin Video Visit. Be sure to Select Allow for your device to access the Microphone and Camera for your visit. You will then be connected, and your provider will be with you shortly.  **If they have any issues connecting, or need assistance please contact MyChart service desk [phone])**  **If using a computer, in order to ensure the best quality for their visit they will need to use either of the following Internet Browsers: [employer], or Google Chrome**  IF USING DOXIMITY or DOXY.ME - The patient will receive a link just prior to their visit by text.     FULL LENGTH CONSENT FOR TELE-HEALTH VISIT   I hereby voluntarily request, consent and authorize [facility] and its employed or contracted physicians, physician assistants, nurse practitioners or other licensed health care professionals (the Practitioner), to provide me with telemedicine health care services (the Services") as deemed necessary by the treating Practitioner. I acknowledge and consent to receive the Services by the Practitioner via telemedicine. I understand that the telemedicine visit will involve communicating with the Practitioner through live audiovisual communication technology and the disclosure of certain medical information by electronic transmission. I acknowledge that I have been given the opportunity to request an in-person assessment or other available alternative prior to the telemedicine visit and am voluntarily participating in the telemedicine visit.  I understand that I have the right to withhold or withdraw my consent to the use of telemedicine in the course of my care at any time, without affecting my right to future care or treatment,  and that the Practitioner or I may terminate the telemedicine visit at any time. I understand that I have the right to inspect all information obtained and/or recorded in the course of the telemedicine visit and may receive copies of available information for a reasonable fee.  I understand that some of the potential risks of receiving the Services via telemedicine include:   Delay or interruption in medical evaluation due to technological equipment failure or disruption;  Information transmitted may not be sufficient (e.g. poor resolution of images) to allow for appropriate medical decision making by the Practitioner; and/or   In rare instances, security protocols could fail, causing a breach of personal health information.  Furthermore, I acknowledge that it is my responsibility to provide information about my medical history, conditions and care that is complete and accurate to the best of my ability. I acknowledge that Practitioner's advice, recommendations, and/or decision may be based on factors not within their control, such as incomplete or inaccurate data provided by me or distortions of diagnostic images or specimens that may result from electronic transmissions. I  understand that the practice of medicine is not an exact science and that Practitioner makes no warranties or guarantees regarding treatment outcomes. I acknowledge that I will receive a copy of this consent concurrently upon execution via email to the email address I last provided but may also request a printed copy by calling the office of [facility].    I understand that my insurance will be billed for this visit.   I have read or had this consent read to me.  I understand the contents of this consent, which adequately explains the benefits and risks of the Services being provided via telemedicine.   I have been provided ample opportunity to ask questions regarding this consent and the Services and have had my questions  answered to my satisfaction.  I give my informed consent for the services to be provided through the use of telemedicine in my medical care  By participating in this telemedicine visit I agree to the above.

## 2019-01-19 NOTE — Progress Notes (Signed)
Virtual Visit via Telephone Note   This visit type was conducted due to national recommendations for restrictions regarding the COVID-19 Pandemic (e.g. social distancing) in an effort to limit this patient's exposure and mitigate transmission in our community.  Due to her co-morbid illnesses, this patient is at least at moderate risk for complications without adequate follow up.  This format is felt to be most appropriate for this patient at this time.  The patient did not have access to video technology/had technical difficulties with video requiring transitioning to audio format only (telephone).  All issues noted in this document were discussed and addressed.  No physical exam could be performed with this format.  Please refer to the patient's chart for her  consent to telehealth for [facility].   Date:  01/20/2019   ID:  Lauren David, DOB Apr 25, 1961, MRN [id number]  Patient Location: Home Provider Location: Home  PCP:  Sinda Du, MD  Cardiologist:  Rozann Lesches, MD Electrophysiologist:  None   Evaluation Performed:  Follow-Up Visit  Chief Complaint:   Cardiac follow-up  History of Present Illness:    Lauren David is a 58 y.o. female last seen in February.  She did not have video access and we spoke by phone today.  She does not report any progressive angina symptoms since last encounter, has been tolerating Norvasc as an antianginal.  She has not used any recent sublingual nitroglycerin.  We went over her medications which are listed below and otherwise stable from a cardiac perspective.  She is still taking Chantix, has cut her smoking half, still try to quit completely.  The patient does not have symptoms concerning for COVID-19 infection (fever, chills, cough, or new shortness of breath).    Past Medical History:  Diagnosis Date  . Anxiety   . CAD (coronary artery disease)    Multivessel disease 05/2018  . Chronic back pain   . COPD (chronic obstructive  pulmonary disease) ([place])   . Depression   . Essential hypertension   . Hyperlipidemia   . Type 2 diabetes mellitus ([place])    Past Surgical History:  Procedure Laterality Date  . COLONOSCOPY N/A 04/26/2018   Procedure: COLONOSCOPY;  Surgeon: Rogene Houston, MD;  [facility];  Service: Endoscopy;  Laterality: N/A;  1:15  . INTRAVASCULAR PRESSURE WIRE/FFR STUDY N/A 05/12/2018   Procedure: INTRAVASCULAR PRESSURE WIRE/FFR STUDY;  Surgeon: Nelva Bush, MD;  Location: [place] [facility];  Service: Cardiovascular;  Laterality: N/A;  . LEFT HEART CATH AND CORONARY ANGIOGRAPHY N/A 05/12/2018   Procedure: LEFT HEART CATH AND CORONARY ANGIOGRAPHY;  Surgeon: Nelva Bush, MD;  Location: [place] [facility];  Service: Cardiovascular;  Laterality: N/A;  . POLYPECTOMY  04/26/2018   Procedure: POLYPECTOMY;  Surgeon: Rogene Houston, MD;  [facility];  Service: Endoscopy;;  colon   . TOTAL VAGINAL HYSTERECTOMY       Current Meds  Medication Sig  . albuterol (PROVENTIL HFA;VENTOLIN HFA) 108 (90 Base) MCG/ACT inhaler Inhale 2 puffs into the lungs every 4 (four) hours as needed for wheezing or shortness of breath.  Marland Kitchen amLODipine (NORVASC) 2.5 MG tablet Take 2.5 mg by mouth daily.  Marland Kitchen aspirin EC 81 MG tablet Take 1 tablet (81 mg total) by mouth daily.  . bisoprolol (ZEBETA) 5 MG tablet Take 0.5 tablets (2.5 mg total) by mouth daily.  Marland Kitchen dicyclomine (BENTYL) 10 MG capsule Take 1 capsule (10 mg total) by mouth 3 (three) times daily  as needed.  Marland Kitchen lisinopril (PRINIVIL,ZESTRIL) 5 MG tablet Take 5 mg by mouth daily.  . metFORMIN (GLUCOPHAGE) 500 MG tablet Take 1 tablet (500 mg total) by mouth 2 (two) times daily with a meal.  . nitroGLYCERIN (NITROSTAT) 0.4 MG SL tablet Place 1 tablet (0.4 mg total) under the tongue every 5 (five) minutes x 3 doses as needed for chest pain (if no relief after 3rd dose, proceed to the ED for an evaluation).  . pravastatin (PRAVACHOL) 40 MG tablet Take 40  mg by mouth at bedtime.   . sitaGLIPtin (JANUVIA) 100 MG tablet Take 100 mg by mouth daily.  Marland Kitchen tiotropium (SPIRIVA) 18 MCG inhalation capsule Place 18 mcg into inhaler and inhale daily.  . varenicline (CHANTIX STARTING MONTH PAK) 0.5 MG X 11 & 1 MG X 42 tablet USE AS DIRECTED     Allergies:   Percocet [oxycodone-acetaminophen]   Social History   Tobacco Use  . Smoking status: Current Every Day Smoker    Types: Cigarettes  . Smokeless tobacco: Never Used  . Tobacco comment: cut back to 1 pack a day. Smokig x 72yrs. Did smoke 2 packs x 5 yrs, but cut back a month ago.   Substance Use Topics  . Alcohol use: Never    Frequency: Never  . Drug use: Never     Family Hx: The patient's family history includes Hypertension in her father.  ROS:   Please see the history of present illness. All other systems reviewed and are negative.   Prior CV studies:   The following studies were reviewed today:  Cardiac catheterization 05/12/2018: Conclusions: 1. Moderate to severe 3-vessel coronary artery disease, including 50% proximal LAD, 70% mid LCx, and 60% mid RCA stenoses. FFR of LAD and RCA lesions is borderline positive (FFR 0.80-0.81). Mid/distal LCx is small and supplies a relatively small territory. 2. Mildly elevated left ventricular filling pressure with normal left ventricular systolic function.  Recommendations: 1. Aggressive medical therapy. I will add isosorbide mononitrate 30 mg daily. Tobacco cessation and better control of her diabetes are critical. 2. If patient has persistent symptoms and/or worsening of her coronary artery disease, surgical consultation would need to be considered, given three-vessel disease involving the proximal LAD and the patient's history of diabetes. However, I am not sure that the distal LCx is graftable, given its relatively small size.  Lexiscan Myoview 03/25/2018:  There was no ST segment deviation noted during stress.  Findings consistent  with prior anterior/ansteroseptal myocardial infarction with mild to moderate peri-infarct ischemia.  This is a low to intermediate risk study. TID is elevated at 1.55, which may suggest higher risk.  The left ventricular ejection fraction is normal (55-65%).  Labs/Other Tests and Data Reviewed:    EKG:  An ECG dated 05/12/2018 was personally reviewed today and demonstrated:  Normal sinus rhythm.  Recent Labs: 05/06/2018: BUN 9; Creat 0.61; Hemoglobin 14.9; Platelets 292; Potassium 4.8; Sodium 136    Wt Readings from Last 3 Encounters:  01/20/19 163 lb (73.9 kg)  09/21/18 170 lb (77.1 kg)  06/16/18 180 lb 9.6 oz (81.9 kg)     Objective:    Vital Signs:  Pulse 78   Ht 5\' 1"  (1.549 m)   Wt 163 lb (73.9 kg)   BMI 30.80 kg/m    Patient spoke in full sentences, not short of breath. No audible wheezing or coughing. Speech pattern normal.  ASSESSMENT & PLAN:    1.  Multivessel CAD being managed medically at this  time and symptomatically stable.  Continue current regimen including Norvasc which she is tolerating as an antianginal.  2.  Tobacco abuse with COPD.  She continues to follow with Dr. Luan Pulling.  She remains on Chantix and is continuing to cut back on her smoking.  3.  Mixed hyperlipidemia on Pravachol.  Keep follow-up with Dr. Luan Pulling.  COVID-19 Education: The signs and symptoms of COVID-19 were discussed with the patient and how to seek care for testing (follow up with PCP or arrange E-visit).  The importance of social distancing was discussed today.  Time:   Today, I have spent 5 minutes with the patient with telehealth technology discussing the above problems.     Medication Adjustments/Labs and Tests Ordered: Current medicines are reviewed at length with the patient today.  Concerns regarding medicines are outlined above.   Tests Ordered: No orders of the defined types were placed in this encounter.   Medication Changes: No orders of the defined types were  placed in this encounter.   Follow Up:  In Person 6 months in the [place] office.  Signed, Rozann Lesches, MD  01/20/2019 9:55 AM    [place]

## 2019-01-20 ENCOUNTER — Encounter: Payer: Self-pay | Admitting: Cardiology

## 2019-01-20 ENCOUNTER — Other Ambulatory Visit: Payer: Self-pay

## 2019-01-20 ENCOUNTER — Telehealth (INDEPENDENT_AMBULATORY_CARE_PROVIDER_SITE_OTHER): Payer: Medicaid Other | Admitting: Cardiology

## 2019-01-20 VITALS — HR 78 | Ht 61.0 in | Wt 163.0 lb

## 2019-01-20 DIAGNOSIS — Z720 Tobacco use: Secondary | ICD-10-CM

## 2019-01-20 DIAGNOSIS — I10 Essential (primary) hypertension: Secondary | ICD-10-CM

## 2019-01-20 DIAGNOSIS — I25119 Atherosclerotic heart disease of native coronary artery with unspecified angina pectoris: Secondary | ICD-10-CM

## 2019-01-20 DIAGNOSIS — Z7189 Other specified counseling: Secondary | ICD-10-CM | POA: Diagnosis not present

## 2019-01-20 DIAGNOSIS — E782 Mixed hyperlipidemia: Secondary | ICD-10-CM | POA: Diagnosis not present

## 2019-01-20 NOTE — Patient Instructions (Addendum)
Medication Instructions: Your physician recommends that you continue on your current medications as directed. Please refer to the Current Medication list given to you today.   Labwork: none  Procedures/Testing: none  Follow-Up: 6 months with Dr.McDowell  Any Additional Special Instructions Will Be Listed Below (If Applicable).     If you need a refill on your cardiac medications before your next appointment, please call your pharmacy.      Thank you for choosing [facility] !        

## 2019-01-21 ENCOUNTER — Ambulatory Visit: Payer: Medicaid Other | Admitting: Cardiology

## 2019-05-30 ENCOUNTER — Other Ambulatory Visit: Payer: Self-pay

## 2019-05-30 ENCOUNTER — Other Ambulatory Visit (HOSPITAL_COMMUNITY): Payer: Self-pay | Admitting: Pulmonary Disease

## 2019-05-30 ENCOUNTER — Ambulatory Visit (HOSPITAL_COMMUNITY)
Admission: RE | Admit: 2019-05-30 | Discharge: 2019-05-30 | Disposition: A | Discharge status: Home / Self Care | Payer: Medicaid Other | Source: Ambulatory Visit | Attending: Pulmonary Disease | Admitting: Pulmonary Disease

## 2019-05-30 DIAGNOSIS — J449 Chronic obstructive pulmonary disease, unspecified: Secondary | ICD-10-CM | POA: Diagnosis not present

## 2019-11-23 NOTE — Progress Notes (Signed)
Virtual Visit via Telephone Note   This visit type was conducted due to national recommendations for restrictions regarding the COVID-19 Pandemic (e.g. social distancing) in an effort to limit this patient's exposure and mitigate transmission in our community.  Due to her co-morbid illnesses, this patient is at least at moderate risk for complications without adequate follow up.  This format is felt to be most appropriate for this patient at this time.  The patient did not have access to video technology/had technical difficulties with video requiring transitioning to audio format only (telephone).  All issues noted in this document were discussed and addressed.  No physical exam could be performed with this format.  Please refer to the patient's chart for her  consent to telehealth for [facility].   The patient was identified using 2 identifiers.  Date:  11/24/2019   ID:  Lauren, David 04-04-1961, MRN [id number]  Patient Location: Home Provider Location: Home  PCP:  Sinda Du, MD  Cardiologist:  Rozann Lesches, MD Electrophysiologist:  None   Evaluation Performed:  Follow-Up Visit  Chief Complaint:  Cardiac follow-up  History of Present Illness:   Lauren David is a 59 y.o. female last assessed via telehealth encounter in June 2020.  We spoke by phone today.  She does not report any active angina at this time or nitroglycerin use.  States that she has been compliant with her medications as listed below.  We discussed primary care follow-up in light of Dr. Luan Pulling' retirement.  She plans to check with Dr. 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  I went over her medications which are stable from a cardiac perspective.  Recommended follow-up lipid panel on Pravachol to ensure LDL is at goal.  She indicated that she would get this done when she establishes with Dr. Nevada Crane.  She has had both doses of the coronavirus  vaccine.   Past Medical History:  Diagnosis Date  . Anxiety   . CAD (coronary artery disease)    Multivessel disease 05/2018  . Chronic back pain   . COPD (chronic obstructive pulmonary disease) ([place])   . Depression   . Essential hypertension   . Hyperlipidemia   . Type 2 diabetes mellitus ([place])    Past Surgical History:  Procedure Laterality Date  . COLONOSCOPY N/A 04/26/2018   Procedure: COLONOSCOPY;  Surgeon: Rogene Houston, MD;  [facility];  Service: Endoscopy;  Laterality: N/A;  1:15  . INTRAVASCULAR PRESSURE WIRE/FFR STUDY N/A 05/12/2018   Procedure: INTRAVASCULAR PRESSURE WIRE/FFR STUDY;  Surgeon: Nelva Bush, MD;  Location: [place] [facility];  Service: Cardiovascular;  Laterality: N/A;  . LEFT HEART CATH AND CORONARY ANGIOGRAPHY N/A 05/12/2018   Procedure: LEFT HEART CATH AND CORONARY ANGIOGRAPHY;  Surgeon: Nelva Bush, MD;  Location: [place] [facility];  Service: Cardiovascular;  Laterality: N/A;  . POLYPECTOMY  04/26/2018   Procedure: POLYPECTOMY;  Surgeon: Rogene Houston, MD;  [facility];  Service: Endoscopy;;  colon   . TOTAL VAGINAL HYSTERECTOMY       Current Meds  Medication Sig  . albuterol (PROVENTIL HFA;VENTOLIN HFA) 108 (90 Base) MCG/ACT inhaler Inhale 2 puffs into the lungs every 4 (four) hours as needed for wheezing or shortness of breath.  Marland Kitchen amLODipine (NORVASC) 2.5 MG tablet Take 2.5 mg by mouth daily.  Marland Kitchen aspirin EC 81 MG tablet Take 1 tablet (81 mg total) by mouth daily.  . bisoprolol (  ZEBETA) 5 MG tablet Take 0.5 tablets (2.5 mg total) by mouth daily.  Marland Kitchen dicyclomine (BENTYL) 10 MG capsule Take 1 capsule (10 mg total) by mouth 3 (three) times daily as needed.  Marland Kitchen lisinopril (PRINIVIL,ZESTRIL) 5 MG tablet Take 5 mg by mouth daily.  . metFORMIN (GLUCOPHAGE) 500 MG tablet Take 1 tablet (500 mg total) by mouth 2 (two) times daily with a meal.  . nitroGLYCERIN (NITROSTAT) 0.4 MG SL tablet Place 1 tablet (0.4 mg total) under  the tongue every 5 (five) minutes x 3 doses as needed for chest pain (if no relief after 3rd dose, proceed to the ED for an evaluation).  . pravastatin (PRAVACHOL) 40 MG tablet Take 40 mg by mouth at bedtime.   . sitaGLIPtin (JANUVIA) 100 MG tablet Take 100 mg by mouth daily.  Marland Kitchen tiotropium (SPIRIVA) 18 MCG inhalation capsule Place 18 mcg into inhaler and inhale daily.     Allergies:   Percocet [oxycodone-acetaminophen]   ROS:   No palpitations or syncope.  Prior CV studies:   The following studies were reviewed today:  Cardiac catheterization 05/12/2018: Conclusions: 1. Moderate to severe 3-vessel coronary artery disease, including 50% proximal LAD, 70% mid LCx, and 60% mid RCA stenoses. FFR of LAD and RCA lesions is borderline positive (FFR 0.80-0.81). Mid/distal LCx is small and supplies a relatively small territory. 2. Mildly elevated left ventricular filling pressure with normal left ventricular systolic function.  Labs/Other Tests and Data Reviewed:    EKG:  An ECG dated 05/12/2018 was personally reviewed today and demonstrated:  Normal sinus rhythm.  Recent Labs:  05/06/2018: BUN 9; Creat 0.61; Hemoglobin 14.9; Platelets 292; Potassium 4.8; Sodium 136   Wt Readings from Last 3 Encounters:  01/20/19 163 lb (73.9 kg)  09/21/18 170 lb (77.1 kg)  06/16/18 180 lb 9.6 oz (81.9 kg)     Objective:    Vital Signs:  BP 112/72   Pulse 89   Ht 5\' 1"  (1.549 m)   BMI 30.80 kg/m    Patient spoke in full sentences, not short of breath. No audible wheezing or coughing.  ASSESSMENT & PLAN:    1.  Overall moderate, multivessel CAD as outlined above.  Plan is medical therapy and observation in the absence of progressive angina symptoms.  She will continue on aspirin, Norvasc, Zebeta, lisinopril, and Pravachol for now.  Next encounter in office with ECG.  Continue to monitor symptom control as guide for need to consider any further ischemic testing or revascularization.  2.  Mixed  hyperlipidemia, she continues on Pravachol.  Recommended follow-up lipid panel to ensure LDL 70 or less.  She plans to establish with Dr. Nevada Crane as a new PCP.   Time:   Today, I have spent 5 minutes with the patient with telehealth technology discussing the above problems.     Medication Adjustments/Labs and Tests Ordered: Current medicines are reviewed at length with the patient today.  Concerns regarding medicines are outlined above.   Tests Ordered: No orders of the defined types were placed in this encounter.   Medication Changes: No orders of the defined types were placed in this encounter.   Follow Up:  In Person 6 months in the [place] office.  Signed, Rozann Lesches, MD  11/24/2019 11:28 AM    [place]

## 2019-11-24 ENCOUNTER — Other Ambulatory Visit: Payer: Self-pay

## 2019-11-24 ENCOUNTER — Telehealth (INDEPENDENT_AMBULATORY_CARE_PROVIDER_SITE_OTHER): Payer: Medicaid Other | Admitting: Cardiology

## 2019-11-24 ENCOUNTER — Encounter: Payer: Self-pay | Admitting: Cardiology

## 2019-11-24 VITALS — BP 112/72 | HR 89 | Ht 61.0 in

## 2019-11-24 DIAGNOSIS — E782 Mixed hyperlipidemia: Secondary | ICD-10-CM

## 2019-11-24 DIAGNOSIS — I2510 Atherosclerotic heart disease of native coronary artery without angina pectoris: Secondary | ICD-10-CM | POA: Diagnosis not present

## 2019-11-24 DIAGNOSIS — I25119 Atherosclerotic heart disease of native coronary artery with unspecified angina pectoris: Secondary | ICD-10-CM

## 2019-11-24 NOTE — Patient Instructions (Signed)
Medication Instructions:  Your physician recommends that you continue on your current medications as directed. Please refer to the Current Medication list given to you today.  *If you need a refill on your cardiac medications before your next appointment, please call your pharmacy*   Lab Work: None today, I will request labs from [place]  If you have labs (blood work) drawn today and your tests are completely normal, you will receive your results only by: Marland Kitchen MyChart Message (if you have MyChart) OR . A paper copy in the mail If you have any lab test that is abnormal or we need to change your treatment, we will call you to review the results.   Testing/Procedures: None    Follow-Up: At [facility], you and your health needs are our priority.  As part of our continuing mission to provide you with exceptional heart care, we have created designated Provider Care Teams.  These Care Teams include your primary Cardiologist (physician) and Advanced Practice Providers (APPs -  Physician Assistants and Nurse Practitioners) who all work together to provide you with the care you need, when you need it.  We recommend signing up for the patient portal called "MyChart".  Sign up information is provided on this After Visit Summary.  MyChart is used to connect with patients for Virtual Visits (Telemedicine).  Patients are able to view lab/test results, encounter notes, upcoming appointments, etc.  Non-urgent messages can be sent to your provider as well.   To learn more about what you can do with MyChart, go to [email or web address].    Your next appointment:   6 month(s)  The format for your next appointment:   In Person  Provider:   Rozann Lesches, MD   Other Instructions None      Thank you for choosing [facility] !

## 2020-05-25 ENCOUNTER — Ambulatory Visit (INDEPENDENT_AMBULATORY_CARE_PROVIDER_SITE_OTHER): Payer: Medicaid Other | Admitting: Cardiology

## 2020-05-25 ENCOUNTER — Encounter: Payer: Self-pay | Admitting: Cardiology

## 2020-05-25 ENCOUNTER — Other Ambulatory Visit: Payer: Self-pay

## 2020-05-25 VITALS — BP 136/68 | HR 84 | Resp 16 | Ht 61.0 in | Wt 167.8 lb

## 2020-05-25 DIAGNOSIS — E782 Mixed hyperlipidemia: Secondary | ICD-10-CM | POA: Diagnosis not present

## 2020-05-25 DIAGNOSIS — I25119 Atherosclerotic heart disease of native coronary artery with unspecified angina pectoris: Secondary | ICD-10-CM

## 2020-05-25 DIAGNOSIS — I10 Essential (primary) hypertension: Secondary | ICD-10-CM | POA: Diagnosis not present

## 2020-05-25 MED ORDER — PRAVASTATIN SODIUM 40 MG PO TABS: 40.0000 mg | ORAL_TABLET | Freq: Every day | ORAL | 3 refills | Status: DC

## 2020-05-25 MED ORDER — AMLODIPINE BESYLATE 2.5 MG PO TABS: 2.5000 mg | ORAL_TABLET | Freq: Every day | ORAL | 1 refills | Status: DC

## 2020-05-25 MED ORDER — DICYCLOMINE HCL 10 MG PO CAPS: 10.0000 mg | ORAL_CAPSULE | Freq: Three times a day (TID) | ORAL | 1 refills | Status: DC | PRN

## 2020-05-25 MED ORDER — METFORMIN HCL 500 MG PO TABS: 500.0000 mg | ORAL_TABLET | Freq: Two times a day (BID) | ORAL | 3 refills | Status: DC

## 2020-05-25 MED ORDER — BISOPROLOL FUMARATE 5 MG PO TABS: 2.5000 mg | ORAL_TABLET | Freq: Every day | ORAL | 3 refills | Status: DC

## 2020-05-25 MED ORDER — NITROGLYCERIN 0.4 MG SL SUBL: 0.4000 mg | SUBLINGUAL_TABLET | SUBLINGUAL | 3 refills | Status: DC | PRN

## 2020-05-25 MED ORDER — SITAGLIPTIN PHOSPHATE 100 MG PO TABS: 100.0000 mg | ORAL_TABLET | Freq: Every day | ORAL | 3 refills | Status: DC

## 2020-05-25 MED ORDER — LISINOPRIL 5 MG PO TABS: 5.0000 mg | ORAL_TABLET | Freq: Every day | ORAL | 3 refills | Status: DC

## 2020-05-25 NOTE — Progress Notes (Signed)
Cardiology Office Note  Date: 05/25/2020   ID: [name], [name] February 08, 1961, MRN [id number]  PCP:  Patient, No Pcp Per  Cardiologist:  Rozann Lesches, MD Electrophysiologist:  None   Chief Complaint  Patient presents with  . Cardiac follow-up    History of Present Illness: Lauren David is a 58 y.o. female last assessed via telehealth encounter in April.  She presents for a routine visit.  Reports fatigue, has had back pain, some pleuritic chest discomfort, also hip pain.  She has not yet established with a PCP following Dr. Luan Pulling' retirement.  We discussed options today and gave her phone numbers to make contact.  I reviewed her medications, she needs refills, I agreed to refill her diabetes medicines until she gets in with a PCP.  We also discussed arranging follow-up lab work.  I personally reviewed her ECG today which shows normal sinus rhythm.  Past Medical History:  Diagnosis Date  . Anxiety   . CAD (coronary artery disease)    Multivessel disease 05/2018  . Chronic back pain   . COPD (chronic obstructive pulmonary disease) ([place])   . Depression   . Essential hypertension   . Hyperlipidemia   . Type 2 diabetes mellitus ([place])     Past Surgical History:  Procedure Laterality Date  . COLONOSCOPY N/A 04/26/2018   Procedure: COLONOSCOPY;  Surgeon: Rogene Houston, MD;  [facility];  Service: Endoscopy;  Laterality: N/A;  1:15  . INTRAVASCULAR PRESSURE WIRE/FFR STUDY N/A 05/12/2018   Procedure: INTRAVASCULAR PRESSURE WIRE/FFR STUDY;  Surgeon: Nelva Bush, MD;  Location: [facility];  Service: Cardiovascular;  Laterality: N/A;  . LEFT HEART CATH AND CORONARY ANGIOGRAPHY N/A 05/12/2018   Procedure: LEFT HEART CATH AND CORONARY ANGIOGRAPHY;  Surgeon: Nelva Bush, MD;  Location: [facility];  Service: Cardiovascular;  Laterality: N/A;  . POLYPECTOMY  04/26/2018   Procedure: POLYPECTOMY;  Surgeon: Rogene Houston, MD;  [facility];  Service: Endoscopy;;  colon   . TOTAL VAGINAL HYSTERECTOMY      Current Outpatient Medications  Medication Sig Dispense Refill  . albuterol (PROVENTIL HFA;VENTOLIN HFA) 108 (90 Base) MCG/ACT inhaler Inhale 2 puffs into the lungs every 4 (four) hours as needed for wheezing or shortness of breath.    Marland Kitchen amLODipine (NORVASC) 2.5 MG tablet Take 1 tablet (2.5 mg total) by mouth daily. 90 tablet 1  . aspirin EC 81 MG tablet Take 1 tablet (81 mg total) by mouth daily. 90 tablet 3  . bisoprolol (ZEBETA) 5 MG tablet Take 0.5 tablets (2.5 mg total) by mouth daily. 45 tablet 3  . dicyclomine (BENTYL) 10 MG capsule Take 1 capsule (10 mg total) by mouth 3 (three) times daily as needed. 60 capsule 1  . lisinopril (ZESTRIL) 5 MG tablet Take 1 tablet (5 mg total) by mouth daily. 90 tablet 3  . metFORMIN (GLUCOPHAGE) 500 MG tablet Take 1 tablet (500 mg total) by mouth 2 (two) times daily with a meal. 180 tablet 3  . nitroGLYCERIN (NITROSTAT) 0.4 MG SL tablet Place 1 tablet (0.4 mg total) under the tongue every 5 (five) minutes x 3 doses as needed for chest pain (if no relief after 3rd dose, proceed to the ED for an evaluation). 25 tablet 3  . pravastatin (PRAVACHOL) 40 MG tablet Take 1 tablet (40 mg total) by mouth at bedtime. 90 tablet 3  . sitaGLIPtin (JANUVIA) 100 MG tablet Take 1 tablet (100 mg  total) by mouth daily. 90 tablet 3  . tiotropium (SPIRIVA) 18 MCG inhalation capsule Place 18 mcg into inhaler and inhale daily.     No current facility-administered medications for this visit.   Allergies:  Percocet [oxycodone-acetaminophen]   ROS:  No syncope.   Physical Exam: VS:  BP 136/68   Pulse 84   Resp 16   Ht 5\' 1"  (1.549 m)   Wt 167 lb 12.8 oz (76.1 kg)   SpO2 95%   BMI 31.71 kg/m , BMI Body mass index is 31.71 kg/m.  Wt Readings from Last 3 Encounters:  05/25/20 167 lb 12.8 oz (76.1 kg)  01/20/19 163 lb (73.9 kg)  09/21/18 170 lb (77.1 kg)    General: Patient appears  comfortable at rest. HEENT: Conjunctiva and lids normal, wearing a mask. Neck: Supple, no elevated JVP or carotid bruits, no thyromegaly. Lungs: Clear to auscultation, nonlabored breathing at rest. Cardiac: Regular rate and rhythm, no S3 or significant systolic murmur, no pericardial rub. Extremities: No pitting edema, distal pulses 2+.  ECG:  An ECG dated 05/12/2018 was personally reviewed today and demonstrated:  Normal sinus rhythm.  Recent Labwork:  No interval lab work for review today.  Other Studies Reviewed Today:  Cardiac catheterization 05/12/2018: Conclusions: 1. Moderate to severe 3-vessel coronary artery disease, including 50% proximal LAD, 70% mid LCx, and 60% mid RCA stenoses. FFR of LAD and RCA lesions is borderline positive (FFR 0.80-0.81). Mid/distal LCx is small and supplies a relatively small territory. 2. Mildly elevated left ventricular filling pressure with normal left ventricular systolic function.  Assessment and Plan:  1.  Moderate, multivessel distribution CAD being managed medically.  ECG is normal today.  Continue aspirin, Norvasc, Zebeta, lisinopril, and Pravachol.  She has as needed nitroglycerin available as well.  2.  Mixed hyperlipidemia, on Pravachol.  Check FLP and LFTs.  3.  Essential hypertension, no change in current regimen.  Blood pressure control is reasonable today.  Check BMET.  4.  Routine health maintenance, we provided phone numbers for PCPs in the area for her to make contact and establish in the near future.  Medication Adjustments/Labs and Tests Ordered: Current medicines are reviewed at length with the patient today.  Concerns regarding medicines are outlined above.   Tests Ordered: Orders Placed This Encounter  Procedures  . Comprehensive metabolic panel  . Lipid Profile    Medication Changes: Meds ordered this encounter  Medications  . amLODipine (NORVASC) 2.5 MG tablet    Sig: Take 1 tablet (2.5 mg total) by mouth  daily.    Dispense:  90 tablet    Refill:  1  . bisoprolol (ZEBETA) 5 MG tablet    Sig: Take 0.5 tablets (2.5 mg total) by mouth daily.    Dispense:  45 tablet    Refill:  3  . dicyclomine (BENTYL) 10 MG capsule    Sig: Take 1 capsule (10 mg total) by mouth 3 (three) times daily as needed.    Dispense:  60 capsule    Refill:  1  . lisinopril (ZESTRIL) 5 MG tablet    Sig: Take 1 tablet (5 mg total) by mouth daily.    Dispense:  90 tablet    Refill:  3  . metFORMIN (GLUCOPHAGE) 500 MG tablet    Sig: Take 1 tablet (500 mg total) by mouth 2 (two) times daily with a meal.    Dispense:  180 tablet    Refill:  3  . nitroGLYCERIN (NITROSTAT) 0.4  MG SL tablet    Sig: Place 1 tablet (0.4 mg total) under the tongue every 5 (five) minutes x 3 doses as needed for chest pain (if no relief after 3rd dose, proceed to the ED for an evaluation).    Dispense:  25 tablet    Refill:  3  . pravastatin (PRAVACHOL) 40 MG tablet    Sig: Take 1 tablet (40 mg total) by mouth at bedtime.    Dispense:  90 tablet    Refill:  3  . sitaGLIPtin (JANUVIA) 100 MG tablet    Sig: Take 1 tablet (100 mg total) by mouth daily.    Dispense:  90 tablet    Refill:  3    Disposition:  Follow up 6 months in the [place] office.  Signed, Lauren Sark, MD, [facility] 05/25/2020 4:38 PM    [place] [facility] [address] [place], [place] [address] Phone: [phone]; Fax: [phone]

## 2020-05-25 NOTE — Patient Instructions (Signed)
Medication Instructions:  Your physician recommends that you continue on your current medications as directed. Please refer to the Current Medication list given to you today.  *If you need a refill on your cardiac medications before your next appointment, please call your pharmacy*   Lab Work: CMET,Lipids  If you have labs (blood work) drawn today and your tests are completely normal, you will receive your results only by: Marland Kitchen MyChart Message (if you have MyChart) OR . A paper copy in the mail If you have any lab test that is abnormal or we need to change your treatment, we will call you to review the results.   Testing/Procedures: None today   Follow-Up: At [facility], you and your health needs are our priority.  As part of our continuing mission to provide you with exceptional heart care, we have created designated Provider Care Teams.  These Care Teams include your primary Cardiologist (physician) and Advanced Practice Providers (APPs -  Physician Assistants and Nurse Practitioners) who all work together to provide you with the care you need, when you need it.  We recommend signing up for the patient portal called "MyChart".  Sign up information is provided on this After Visit Summary.  MyChart is used to connect with patients for Virtual Visits (Telemedicine).  Patients are able to view lab/test results, encounter notes, upcoming appointments, etc.  Non-urgent messages can be sent to your provider as well.   To learn more about what you can do with MyChart, go to [email or web address].    Your next appointment:   6 month(s)  The format for your next appointment:   In Person  Provider:   Rozann Lesches, MD   Other Instructions Dr.John [place] Crane [phone]  [place]  Dr.Patel   [phone], list to prompts for number to front desk     Thank you for choosing [facility] !

## 2020-09-10 ENCOUNTER — Ambulatory Visit (INDEPENDENT_AMBULATORY_CARE_PROVIDER_SITE_OTHER): Payer: Medicare Other | Admitting: Internal Medicine

## 2020-09-10 ENCOUNTER — Encounter: Payer: Self-pay | Admitting: Internal Medicine

## 2020-09-10 ENCOUNTER — Other Ambulatory Visit: Payer: Self-pay

## 2020-09-10 VITALS — BP 159/89 | HR 80 | Temp 98.8°F | Resp 18 | Ht 61.0 in | Wt 164.8 lb

## 2020-09-10 DIAGNOSIS — M25552 Pain in left hip: Secondary | ICD-10-CM

## 2020-09-10 DIAGNOSIS — Z23 Encounter for immunization: Secondary | ICD-10-CM | POA: Diagnosis not present

## 2020-09-10 DIAGNOSIS — J449 Chronic obstructive pulmonary disease, unspecified: Secondary | ICD-10-CM

## 2020-09-10 DIAGNOSIS — Z1231 Encounter for screening mammogram for malignant neoplasm of breast: Secondary | ICD-10-CM

## 2020-09-10 DIAGNOSIS — G8929 Other chronic pain: Secondary | ICD-10-CM | POA: Insufficient documentation

## 2020-09-10 DIAGNOSIS — M8580 Other specified disorders of bone density and structure, unspecified site: Secondary | ICD-10-CM

## 2020-09-10 DIAGNOSIS — Z7689 Persons encountering health services in other specified circumstances: Secondary | ICD-10-CM

## 2020-09-10 DIAGNOSIS — I10 Essential (primary) hypertension: Secondary | ICD-10-CM | POA: Insufficient documentation

## 2020-09-10 DIAGNOSIS — E119 Type 2 diabetes mellitus without complications: Secondary | ICD-10-CM | POA: Diagnosis not present

## 2020-09-10 DIAGNOSIS — E1151 Type 2 diabetes mellitus with diabetic peripheral angiopathy without gangrene: Secondary | ICD-10-CM | POA: Insufficient documentation

## 2020-09-10 DIAGNOSIS — Z720 Tobacco use: Secondary | ICD-10-CM | POA: Insufficient documentation

## 2020-09-10 MED ORDER — SITAGLIPTIN PHOSPHATE 100 MG PO TABS: 100.0000 mg | ORAL_TABLET | Freq: Every day | ORAL | 0 refills | Status: DC

## 2020-09-10 MED ORDER — ALBUTEROL SULFATE (2.5 MG/3ML) 0.083% IN NEBU: 2.5000 mg | INHALATION_SOLUTION | Freq: Four times a day (QID) | RESPIRATORY_TRACT | 1 refills | Status: DC | PRN

## 2020-09-10 MED ORDER — ALBUTEROL SULFATE HFA 108 (90 BASE) MCG/ACT IN AERS: 2.0000 | INHALATION_SPRAY | RESPIRATORY_TRACT | 6 refills | Status: DC | PRN

## 2020-09-10 MED ORDER — VARENICLINE TARTRATE 1 MG PO TABS
1.0000 mg | ORAL_TABLET | Freq: Two times a day (BID) | ORAL | 1 refills | Status: DC
Start: 2020-09-18 — End: 2021-03-11

## 2020-09-10 MED ORDER — AMLODIPINE BESYLATE 5 MG PO TABS: 5.0000 mg | ORAL_TABLET | Freq: Every day | ORAL | 0 refills | Status: DC

## 2020-09-10 MED ORDER — LISINOPRIL 10 MG PO TABS
10.0000 mg | ORAL_TABLET | Freq: Every day | ORAL | 0 refills | Status: DC
Start: 2020-09-10 — End: 2021-05-26

## 2020-09-10 MED ORDER — VARENICLINE TARTRATE 0.5 MG PO TABS: ORAL_TABLET | ORAL | 0 refills | Status: DC

## 2020-09-10 MED ORDER — METFORMIN HCL 500 MG PO TABS
500.0000 mg | ORAL_TABLET | Freq: Two times a day (BID) | ORAL | 3 refills | Status: DC
Start: 2020-09-10 — End: 2021-03-11

## 2020-09-10 MED ORDER — TIOTROPIUM BROMIDE MONOHYDRATE 18 MCG IN CAPS
18.0000 ug | ORAL_CAPSULE | Freq: Every day | RESPIRATORY_TRACT | 5 refills | Status: DC
Start: 2020-09-10 — End: 2020-10-29

## 2020-09-10 MED ORDER — PREDNISONE 10 MG (21) PO TBPK: ORAL_TABLET | ORAL | 0 refills | Status: DC

## 2020-09-10 NOTE — Assessment & Plan Note (Signed)
With acute exacerbation Increased cough, dyspnea and wheezing Refilled Spiriva and PRN Albuterol Plan to start LABA + ICS in the next visit

## 2020-09-10 NOTE — Progress Notes (Signed)
New Patient Office Visit  Subjective:  Patient ID: Lauren David, female    DOB: 1961-05-17  Age: 60 y.o. MRN: [id number]  CC:  Chief Complaint  Patient presents with  . New Patient (Initial Visit)    New patient was seeing dr Luan Pulling has been having some pain in her hip joint for awhile just getting worse recently also needs medications refilled     HPI Lauren David is a 60 year old female with PMH of HTN, COPD, type 2 DM, Osteopenia, chronic left hip pain and tobacco abuse who presents for establishing care.  She complains of chronic cough with clear mucoid expectorant.  She has history of COPD, for which she used to use Spiriva and as needed albuterol inhalers.  She has ran out of her inhalers.  She also reports chronic mild dyspnea and wheezing.  She denies any fever, chills, nausea or vomiting.  She continues to smoke 1.5 packs/day and has cut down from 2 packs/day.  She has tried Chantix in the past and is willing to take it again to help up quit smoking.  Her blood pressure was elevated in the office today upon multiple checks.  She has been taking amlodipine, lisinopril and Zebeta regularly.  She denies any headache, dizziness, chest pain or palpitations.  She follows up with cardiology for history of CAD and takes aspirin and statin daily.  Last colonoscopy in 04/2018.  Last mammography in 2016.  She was taking Metformin and Januvia for her Type 2 DM.  She has ran out of her DM medications.  She is up-to-date with COVID and flu vaccine.  She has had pneumonia vaccine as well -likely PPSV 23.  Past Medical History:  Diagnosis Date  . Anxiety   . CAD (coronary artery disease)    Multivessel disease 05/2018  . Chronic back pain   . COPD (chronic obstructive pulmonary disease) ([place])   . Depression   . Essential hypertension   . Hyperlipidemia   . Type 2 diabetes mellitus ([place])     Past Surgical History:  Procedure Laterality Date  . COLONOSCOPY N/A 04/26/2018    Procedure: COLONOSCOPY;  Surgeon: Rogene Houston, MD;  [facility];  Service: Endoscopy;  Laterality: N/A;  1:15  . INTRAVASCULAR PRESSURE WIRE/FFR STUDY N/A 05/12/2018   Procedure: INTRAVASCULAR PRESSURE WIRE/FFR STUDY;  Surgeon: Nelva Bush, MD;  Location: [facility];  Service: Cardiovascular;  Laterality: N/A;  . LEFT HEART CATH AND CORONARY ANGIOGRAPHY N/A 05/12/2018   Procedure: LEFT HEART CATH AND CORONARY ANGIOGRAPHY;  Surgeon: Nelva Bush, MD;  Location: [facility];  Service: Cardiovascular;  Laterality: N/A;  . POLYPECTOMY  04/26/2018   Procedure: POLYPECTOMY;  Surgeon: Rogene Houston, MD;  [facility];  Service: Endoscopy;;  colon   . TOTAL VAGINAL HYSTERECTOMY      Family History  Problem Relation Age of Onset  . Hypertension Father     Social History   Socioeconomic History  . Marital status: Married    Spouse name: Not on file  . Number of children: Not on file  . Years of education: Not on file  . Highest education level: Not on file  Occupational History  . Not on file  Tobacco Use  . Smoking status: Current Every Day Smoker    Packs/day: 1.00    Types: Cigarettes  . Smokeless tobacco: Never Used  . Tobacco comment: cut back to 1 pack a day. Smokig x 32yr. Did  smoke 2 packs x 5 yrs, but cut back a month ago.   Vaping Use  . Vaping Use: Never used  Substance and Sexual Activity  . Alcohol use: Never  . Drug use: Never  . Sexual activity: Not on file  Other Topics Concern  . Not on file  Social History Narrative  . Not on file   Social Determinants of Health   Financial Resource Strain: Not on file  Food Insecurity: Not on file  Transportation Needs: Not on file  Physical Activity: Not on file  Stress: Not on file  Social Connections: Not on file  Intimate Partner Violence: Not on file    ROS Review of Systems  Constitutional: Negative for chills and fever.  HENT: Negative for congestion, sinus  pressure, sinus pain and sore throat.   Eyes: Negative for pain and discharge.  Respiratory: Positive for cough, shortness of breath and wheezing.   Cardiovascular: Negative for chest pain and palpitations.  Gastrointestinal: Negative for abdominal pain, constipation, diarrhea, nausea and vomiting.  Endocrine: Negative for polydipsia and polyuria.  Genitourinary: Negative for dysuria and hematuria.  Musculoskeletal: Positive for arthralgias and back pain. Negative for neck pain and neck stiffness.  Skin: Negative for rash.  Neurological: Positive for headaches. Negative for dizziness and weakness.  Psychiatric/Behavioral: Negative for agitation and behavioral problems.    Objective:   Today's Vitals: BP (!) 159/89 (BP Location: Right Arm, Patient Position: Sitting, Cuff Size: Normal)   Pulse 80   Temp 98.8 F (37.1 C) (Oral)   Resp 18   Ht '5\' 1"'  (1.549 m)   Wt 164 lb 12.8 oz (74.8 kg)   SpO2 98%   BMI 31.14 kg/m   Physical Exam Vitals reviewed.  Constitutional:      General: She is not in acute distress.    Appearance: She is not diaphoretic.  HENT:     Head: Normocephalic and atraumatic.     Nose: Nose normal.     Mouth/Throat:     Mouth: Mucous membranes are moist.  Eyes:     General: No scleral icterus.    Extraocular Movements: Extraocular movements intact.     Pupils: Pupils are equal, round, and reactive to light.  Cardiovascular:     Rate and Rhythm: Normal rate and regular rhythm.     Pulses: Normal pulses.     Heart sounds: Normal heart sounds. No murmur heard.   Pulmonary:     Breath sounds: Wheezing (Diffuse b/l) present. No rales.  Abdominal:     Palpations: Abdomen is soft.     Tenderness: There is no abdominal tenderness.  Musculoskeletal:     Cervical back: Neck supple. No tenderness.     Right lower leg: No edema.     Left lower leg: No edema.  Skin:    General: Skin is warm.     Findings: No rash.  Neurological:     General: No focal deficit  present.     Mental Status: She is alert and oriented to person, place, and time.  Psychiatric:        Mood and Affect: Mood normal.        Behavior: Behavior normal.     Assessment & Plan:   Problem List Items Addressed This Visit      Encounter to establish care - Primary   Care established Previous chart reviewed History and medications reviewed with the patient     Relevant Orders  CBC with Differential  CMP14+EGFR  HgB A1c  Lipid panel  TSH  Vitamin D (25 hydroxy)    Cardiovascular and Mediastinum   Primary hypertension    BP Readings from Last 1 Encounters:  09/10/20 (!) 159/89   Repeat check: 164/92 uncontrolled with Amlodipine, Lisinopril and Bisoprolol Increased Amlodipine to 5 mg QD and Lisinopril to 10 mg QD Counseled for compliance with the medications Advised DASH diet and moderate exercise/walking, at least 150 mins/week       Relevant Medications   amLODipine (NORVASC) 5 MG tablet   lisinopril (ZESTRIL) 10 MG tablet     Respiratory   Chronic obstructive pulmonary disease ([place])    With acute exacerbation Increased cough, dyspnea and wheezing Refilled Spiriva and PRN Albuterol Plan to start LABA + ICS in the next visit      Relevant Medications   albuterol (VENTOLIN HFA) 108 (90 Base) MCG/ACT inhaler   tiotropium (SPIRIVA) 18 MCG inhalation capsule   predniSONE (STERAPRED UNI-PAK 21 TAB) 10 MG (21) TBPK tablet   varenicline (CHANTIX) 0.5 MG tablet   varenicline (CHANTIX CONTINUING MONTH PAK) 1 MG tablet (Start on 09/18/2020)   albuterol (PROVENTIL) (2.5 MG/3ML) 0.083% nebulizer solution   Other Relevant Orders   CBC with Differential   CMP14+EGFR     Endocrine   Type 2 diabetes mellitus without complication ([place])    Ran out of Metformin and Januvia, refilled Advised to follow diabetic diet On ACEi F/u CMP and lipid panel Diabetic eye exam: Advised to follow up with Ophthalmology for diabetic eye exam       Relevant Medications    sitaGLIPtin (JANUVIA) 100 MG tablet   metFORMIN (GLUCOPHAGE) 500 MG tablet   lisinopril (ZESTRIL) 10 MG tablet   Other Relevant Orders   HgB A1c   Lipid panel     Musculoskeletal and Integument   Osteopenia    DEXA in 2016 Advised to take Caltrate        Other      Need for immunization against influenza    Patient was educated on the recommendation for flu vaccine. After obtaining informed consent, the vaccine was administered no adverse effects noted at time of administration. Patient provided with education on arm soreness and use of tylenol or ibuprofen (if safe) for this. Encourage to use the arm vaccine was given in to help reduce the soreness. Patient educated on the signs of a reaction to the vaccine and advised to contact the office should these occur.      Relevant Orders   Flu Vaccine QUAD 36+ mos IM (Completed)   Tobacco abuse    Smokes 1.5 pack/day.  Asked about quitting: confirms that she currently smokes cigarettes Advise to quit smoking: Educated about QUITTING to reduce the risk of cancer, cardio and cerebrovascular disease. Assess willingness: Unwilling to quit at this time, but is working on cutting back. Assist with counseling and pharmacotherapy: Counseled for 5 minutes and literature provided. Chantix prescribed. Arrange for follow up: Follow up in 3 months and continue to offer help.      Relevant Medications   varenicline (CHANTIX) 0.5 MG tablet   varenicline (CHANTIX CONTINUING MONTH PAK) 1 MG tablet (Start on 09/18/2020)   Left hip pain    Likely arthritis Check X-ray left hip Tylenol PRN      Relevant Orders   DG Arthro Hip Left    Other Visit Diagnoses    Screening mammogram for breast cancer       Relevant Orders   MM 3D SCREEN  BREAST BILATERAL      Outpatient Encounter Medications as of 09/10/2020  Medication Sig  . albuterol (PROVENTIL) (2.5 MG/3ML) 0.083% nebulizer solution Take 3 mLs (2.5 mg total) by nebulization every 6 (six) hours  as needed for wheezing or shortness of breath.  Marland Kitchen aspirin EC 81 MG tablet Take 1 tablet (81 mg total) by mouth daily.  . bisoprolol (ZEBETA) 5 MG tablet Take 0.5 tablets (2.5 mg total) by mouth daily.  Marland Kitchen dicyclomine (BENTYL) 10 MG capsule Take 1 capsule (10 mg total) by mouth 3 (three) times daily as needed.  . pravastatin (PRAVACHOL) 40 MG tablet Take 1 tablet (40 mg total) by mouth at bedtime.  . predniSONE (STERAPRED UNI-PAK 21 TAB) 10 MG (21) TBPK tablet Take as package instructions.  Derrill Memo ON 09/18/2020] varenicline (CHANTIX CONTINUING MONTH PAK) 1 MG tablet Take 1 tablet (1 mg total) by mouth 2 (two) times daily.  . varenicline (CHANTIX) 0.5 MG tablet Take 1 tablet once daily for 3 days and then 1 tablet twice daily for next 4 days.  . [DISCONTINUED] albuterol (PROVENTIL HFA;VENTOLIN HFA) 108 (90 Base) MCG/ACT inhaler Inhale 2 puffs into the lungs every 4 (four) hours as needed for wheezing or shortness of breath.  . [DISCONTINUED] amLODipine (NORVASC) 2.5 MG tablet Take 1 tablet (2.5 mg total) by mouth daily.  . [DISCONTINUED] lisinopril (ZESTRIL) 5 MG tablet Take 1 tablet (5 mg total) by mouth daily.  . [DISCONTINUED] metFORMIN (GLUCOPHAGE) 500 MG tablet Take 1 tablet (500 mg total) by mouth 2 (two) times daily with a meal.  . [DISCONTINUED] sitaGLIPtin (JANUVIA) 100 MG tablet Take 1 tablet (100 mg total) by mouth daily.  . [DISCONTINUED] tiotropium (SPIRIVA) 18 MCG inhalation capsule Place 18 mcg into inhaler and inhale daily.  Marland Kitchen albuterol (VENTOLIN HFA) 108 (90 Base) MCG/ACT inhaler Inhale 2 puffs into the lungs every 4 (four) hours as needed for wheezing or shortness of breath.  Marland Kitchen amLODipine (NORVASC) 5 MG tablet Take 1 tablet (5 mg total) by mouth daily.  Marland Kitchen lisinopril (ZESTRIL) 10 MG tablet Take 1 tablet (10 mg total) by mouth daily.  . metFORMIN (GLUCOPHAGE) 500 MG tablet Take 1 tablet (500 mg total) by mouth 2 (two) times daily with a meal.  . nitroGLYCERIN (NITROSTAT) 0.4 MG SL  tablet Place 1 tablet (0.4 mg total) under the tongue every 5 (five) minutes x 3 doses as needed for chest pain (if no relief after 3rd dose, proceed to the ED for an evaluation).  . sitaGLIPtin (JANUVIA) 100 MG tablet Take 1 tablet (100 mg total) by mouth daily.  Marland Kitchen tiotropium (SPIRIVA) 18 MCG inhalation capsule Place 1 capsule (18 mcg total) into inhaler and inhale daily.   No facility-administered encounter medications on file as of 09/10/2020.    Follow-up: Return in about 3 months (around 12/08/2020) for Annual physical.   Lindell Spar, MD

## 2020-09-10 NOTE — Assessment & Plan Note (Signed)
Likely arthritis Check X-ray left hip Tylenol PRN

## 2020-09-10 NOTE — Assessment & Plan Note (Signed)
Care established Previous chart reviewed History and medications reviewed with the patient 

## 2020-09-10 NOTE — Assessment & Plan Note (Signed)

## 2020-09-10 NOTE — Assessment & Plan Note (Signed)
DEXA in 2016 Advised to take Caltrate

## 2020-09-10 NOTE — Patient Instructions (Addendum)
Please take steroids as prescribed.  Please start using inhalers as prescribed. Please avoid using Albuterol inhaler and nebs at same time.  Please get X-ray of the left hip done.  Please take Tylenol for hip pain up to 3 times in a day.  Please get fasting blood tests done before the next visit.

## 2020-09-10 NOTE — Assessment & Plan Note (Signed)
Smokes 1.5 pack/day.  Asked about quitting: confirms that she currently smokes cigarettes Advise to quit smoking: Educated about QUITTING to reduce the risk of cancer, cardio and cerebrovascular disease. Assess willingness: Unwilling to quit at this time, but is working on cutting back. Assist with counseling and pharmacotherapy: Counseled for 5 minutes and literature provided. Chantix prescribed. Arrange for follow up: Follow up in 3 months and continue to offer help.

## 2020-09-10 NOTE — Assessment & Plan Note (Signed)
BP Readings from Last 1 Encounters:  09/10/20 (!) 159/89   Repeat check: 164/92 uncontrolled with Amlodipine, Lisinopril and Bisoprolol Increased Amlodipine to 5 mg QD and Lisinopril to 10 mg QD Counseled for compliance with the medications Advised DASH diet and moderate exercise/walking, at least 150 mins/week

## 2020-09-10 NOTE — Assessment & Plan Note (Signed)
Ran out of Metformin and Januvia, refilled Advised to follow diabetic diet On ACEi F/u CMP and lipid panel Diabetic eye exam: Advised to follow up with Ophthalmology for diabetic eye exam

## 2020-10-29 ENCOUNTER — Other Ambulatory Visit: Payer: Self-pay | Admitting: Internal Medicine

## 2020-10-29 DIAGNOSIS — J449 Chronic obstructive pulmonary disease, unspecified: Secondary | ICD-10-CM

## 2020-10-29 MED ORDER — INCRUSE ELLIPTA 62.5 MCG/INH IN AEPB: 1.0000 | INHALATION_SPRAY | Freq: Every day | RESPIRATORY_TRACT | 2 refills | Status: DC

## 2020-11-01 ENCOUNTER — Other Ambulatory Visit: Payer: Self-pay | Admitting: Internal Medicine

## 2020-11-01 DIAGNOSIS — J449 Chronic obstructive pulmonary disease, unspecified: Secondary | ICD-10-CM

## 2020-11-30 ENCOUNTER — Other Ambulatory Visit: Payer: Self-pay | Admitting: *Deleted

## 2020-11-30 DIAGNOSIS — Z1231 Encounter for screening mammogram for malignant neoplasm of breast: Secondary | ICD-10-CM

## 2020-12-10 ENCOUNTER — Ambulatory Visit (HOSPITAL_COMMUNITY)
Admission: RE | Admit: 2020-12-10 | Discharge: 2020-12-10 | Disposition: A | Discharge status: Home / Self Care | Payer: Medicare Other | Source: Ambulatory Visit | Attending: Internal Medicine | Admitting: Internal Medicine

## 2020-12-10 ENCOUNTER — Other Ambulatory Visit (HOSPITAL_COMMUNITY): Payer: Self-pay | Admitting: Internal Medicine

## 2020-12-10 ENCOUNTER — Encounter (HOSPITAL_COMMUNITY): Payer: Self-pay

## 2020-12-10 ENCOUNTER — Ambulatory Visit: Payer: Medicare Other | Admitting: Internal Medicine

## 2020-12-10 ENCOUNTER — Other Ambulatory Visit: Payer: Self-pay | Admitting: Internal Medicine

## 2020-12-10 DIAGNOSIS — M25552 Pain in left hip: Secondary | ICD-10-CM | POA: Diagnosis present

## 2020-12-10 DIAGNOSIS — Z1231 Encounter for screening mammogram for malignant neoplasm of breast: Secondary | ICD-10-CM | POA: Diagnosis present

## 2020-12-10 DIAGNOSIS — R928 Other abnormal and inconclusive findings on diagnostic imaging of breast: Secondary | ICD-10-CM

## 2020-12-10 DIAGNOSIS — R921 Mammographic calcification found on diagnostic imaging of breast: Secondary | ICD-10-CM

## 2020-12-11 ENCOUNTER — Telehealth: Payer: Self-pay

## 2020-12-11 NOTE — Telephone Encounter (Signed)
Patient called about mammogram results. Patient call back # [phone]

## 2020-12-11 NOTE — Telephone Encounter (Signed)
LVM for pt to call the office regarding mammo results

## 2020-12-12 NOTE — Telephone Encounter (Signed)
Pt advised of mammo and hip results

## 2020-12-14 ENCOUNTER — Other Ambulatory Visit: Payer: Self-pay | Admitting: Internal Medicine

## 2020-12-14 DIAGNOSIS — J449 Chronic obstructive pulmonary disease, unspecified: Secondary | ICD-10-CM

## 2020-12-14 DIAGNOSIS — I10 Essential (primary) hypertension: Secondary | ICD-10-CM

## 2021-01-01 ENCOUNTER — Ambulatory Visit (HOSPITAL_COMMUNITY)
Admission: RE | Admit: 2021-01-01 | Discharge: 2021-01-01 | Disposition: A | Discharge status: Home / Self Care | Payer: Medicare Other | Source: Ambulatory Visit | Attending: Internal Medicine | Admitting: Internal Medicine

## 2021-01-01 ENCOUNTER — Other Ambulatory Visit: Payer: Self-pay

## 2021-01-01 DIAGNOSIS — R921 Mammographic calcification found on diagnostic imaging of breast: Secondary | ICD-10-CM | POA: Diagnosis present

## 2021-01-01 DIAGNOSIS — R928 Other abnormal and inconclusive findings on diagnostic imaging of breast: Secondary | ICD-10-CM | POA: Diagnosis not present

## 2021-01-25 LAB — CMP14+EGFR
ALT: 18 IU/L (ref 0–32)
AST: 13 IU/L (ref 0–40)
Albumin/Globulin Ratio: 1.7 (ref 1.2–2.2)
Albumin: 4 g/dL (ref 3.8–4.8)
Alkaline Phosphatase: 97 IU/L (ref 44–121)
BUN/Creatinine Ratio: 11 — ABNORMAL LOW (ref 12–28)
BUN: 7 mg/dL — ABNORMAL LOW (ref 8–27)
Bilirubin Total: 0.4 mg/dL (ref 0.0–1.2)
CO2: 23 mmol/L (ref 20–29)
Calcium: 9.5 mg/dL (ref 8.7–10.3)
Chloride: 101 mmol/L (ref 96–106)
Creatinine, Ser: 0.65 mg/dL (ref 0.57–1.00)
Globulin, Total: 2.3 g/dL (ref 1.5–4.5)
Glucose: 272 mg/dL — ABNORMAL HIGH (ref 65–99)
Potassium: 4.3 mmol/L (ref 3.5–5.2)
Sodium: 139 mmol/L (ref 134–144)
Total Protein: 6.3 g/dL (ref 6.0–8.5)
eGFR: 100 mL/min/{1.73_m2} (ref >59)

## 2021-01-25 LAB — CBC WITH DIFFERENTIAL/PLATELET
Basophils Absolute: 0.1 10*3/uL (ref 0.0–0.2)
Basos: 1 %
EOS (ABSOLUTE): 0.4 10*3/uL (ref 0.0–0.4)
Eos: 4 %
Hematocrit: 44.9 % (ref 34.0–46.6)
Hemoglobin: 14.6 g/dL (ref 11.1–15.9)
Immature Grans (Abs): 0 10*3/uL (ref 0.0–0.1)
Immature Granulocytes: 0 %
Lymphocytes Absolute: 3.1 10*3/uL (ref 0.7–3.1)
Lymphs: 32 %
MCH: 28.7 pg (ref 26.6–33.0)
MCHC: 32.5 g/dL (ref 31.5–35.7)
MCV: 88 fL (ref 79–97)
Monocytes Absolute: 0.5 10*3/uL (ref 0.1–0.9)
Monocytes: 6 %
Neutrophils Absolute: 5.6 10*3/uL (ref 1.4–7.0)
Neutrophils: 57 %
Platelets: 279 10*3/uL (ref 150–450)
RBC: 5.08 x10E6/uL (ref 3.77–5.28)
RDW: 11.9 % (ref 11.7–15.4)
WBC: 9.6 10*3/uL (ref 3.4–10.8)

## 2021-01-25 LAB — LIPID PANEL
Chol/HDL Ratio: 5.2 ratio — ABNORMAL HIGH (ref 0.0–4.4)
Cholesterol, Total: 251 mg/dL — ABNORMAL HIGH (ref 100–199)
HDL: 48 mg/dL (ref >39)
LDL Chol Calc (NIH): 179 mg/dL — ABNORMAL HIGH (ref 0–99)
Triglycerides: 131 mg/dL (ref 0–149)
VLDL Cholesterol Cal: 24 mg/dL (ref 5–40)

## 2021-01-25 LAB — HEMOGLOBIN A1C
Est. average glucose Bld gHb Est-mCnc: 289 mg/dL
Hgb A1c MFr Bld: 11.7 % — ABNORMAL HIGH (ref 4.8–5.6)

## 2021-01-25 LAB — VITAMIN D 25 HYDROXY (VIT D DEFICIENCY, FRACTURES): Vit D, 25-Hydroxy: 9.1 ng/mL — ABNORMAL LOW (ref 30.0–100.0)

## 2021-01-25 LAB — TSH: TSH: 1.38 u[IU]/mL (ref 0.450–4.500)

## 2021-01-28 ENCOUNTER — Ambulatory Visit: Payer: Medicare Other | Admitting: Internal Medicine

## 2021-02-08 ENCOUNTER — Encounter: Payer: Medicare Other | Admitting: Internal Medicine

## 2021-02-11 ENCOUNTER — Other Ambulatory Visit: Payer: Self-pay | Admitting: Internal Medicine

## 2021-02-11 DIAGNOSIS — J449 Chronic obstructive pulmonary disease, unspecified: Secondary | ICD-10-CM

## 2021-03-01 ENCOUNTER — Ambulatory Visit: Payer: Medicare Other | Admitting: Family Medicine

## 2021-03-11 ENCOUNTER — Encounter: Payer: Self-pay | Admitting: Internal Medicine

## 2021-03-11 ENCOUNTER — Other Ambulatory Visit: Payer: Self-pay

## 2021-03-11 ENCOUNTER — Ambulatory Visit (INDEPENDENT_AMBULATORY_CARE_PROVIDER_SITE_OTHER): Payer: Medicare Other | Admitting: Internal Medicine

## 2021-03-11 VITALS — BP 155/78 | HR 86 | Temp 98.8°F | Resp 18 | Ht 61.0 in | Wt 162.1 lb

## 2021-03-11 DIAGNOSIS — Z0000 Encounter for general adult medical examination without abnormal findings: Secondary | ICD-10-CM

## 2021-03-11 DIAGNOSIS — Z720 Tobacco use: Secondary | ICD-10-CM

## 2021-03-11 DIAGNOSIS — E119 Type 2 diabetes mellitus without complications: Secondary | ICD-10-CM

## 2021-03-11 DIAGNOSIS — Z780 Asymptomatic menopausal state: Secondary | ICD-10-CM | POA: Diagnosis not present

## 2021-03-11 DIAGNOSIS — Z0001 Encounter for general adult medical examination with abnormal findings: Secondary | ICD-10-CM

## 2021-03-11 DIAGNOSIS — M8580 Other specified disorders of bone density and structure, unspecified site: Secondary | ICD-10-CM

## 2021-03-11 DIAGNOSIS — I10 Essential (primary) hypertension: Secondary | ICD-10-CM | POA: Diagnosis not present

## 2021-03-11 DIAGNOSIS — Z122 Encounter for screening for malignant neoplasm of respiratory organs: Secondary | ICD-10-CM

## 2021-03-11 MED ORDER — GLIPIZIDE 5 MG PO TABS: 5.0000 mg | ORAL_TABLET | Freq: Two times a day (BID) | ORAL | 3 refills | Status: DC

## 2021-03-11 NOTE — Progress Notes (Signed)
Subjective:   Lauren David is a 60 y.o. female who presents for Medicare Annual (Subsequent) preventive examination.  Review of Systems    ROS Constitutional: Negative for chills and fever. HENT: Negative for congestion, sinus pressure, sinus pain and sore throat.   Eyes: Negative for pain and discharge. Respiratory: Positive for cough, shortness of breath and wheezing.   Cardiovascular: Negative for chest pain and palpitations. Gastrointestinal: Negative for abdominal pain, constipation, diarrhea, nausea and vomiting. Endocrine: Negative for polydipsia and polyuria. Genitourinary: Negative for dysuria and hematuria. Musculoskeletal: Positive for arthralgias and back pain. Negative for neck pain and neck stiffness. Skin: Negative for rash. Neurological: Positive for headaches. Negative for dizziness and weakness. Psychiatric/Behavioral: Negative for agitation and behavioral problems.  Cardiac Risk Factors include: advanced age (>35mn, >>44women);diabetes mellitus;hypertension;obesity (BMI >30kg/m2);sedentary lifestyle;smoking/ tobacco exposure     Objective:    Today's Vitals   03/11/21 1052 03/11/21 1054  BP: (!) 155/78   Pulse: 86   Resp: 18   Temp: 98.8 F (37.1 C)   TempSrc: Oral   SpO2: 99%   Weight: 162 lb 1.9 oz (73.5 kg)   Height: '5\' 1"'$  (1.549 m)   PainSc: 0-No pain 0-No pain   Body mass index is 30.63 kg/m.  Advanced Directives 03/11/2021 04/26/2018  Does Patient Have a Medical Advance Directive? No No  Would patient like information on creating a medical advance directive? Yes (MAU/Ambulatory/Procedural Areas - Information given) No - Patient declined    Current Medications (verified) Outpatient Encounter Medications as of 03/11/2021  Medication Sig   albuterol (PROVENTIL) (2.5 MG/3ML) 0.083% nebulizer solution ONE VIAL BY NEBULIZATION EVERY 6 HOURS AS NEEDED FOR WHEEZING OR SHORTNESS OF BREATH.   albuterol (VENTOLIN HFA) 108 (90 Base) MCG/ACT inhaler INHALE  2 PUFFS INTO THE LUNGS EVERY 4 HOURS AS NEEDED FOR WHEEZING OR SHORTNESS OF BREATH.   amLODipine (NORVASC) 5 MG tablet TAKE 1 TABLET ONCE DAILY.   aspirin EC 81 MG tablet Take 1 tablet (81 mg total) by mouth daily.   bisoprolol (ZEBETA) 5 MG tablet Take 0.5 tablets (2.5 mg total) by mouth daily.   dicyclomine (BENTYL) 10 MG capsule Take 1 capsule (10 mg total) by mouth 3 (three) times daily as needed.   glipiZIDE (GLUCOTROL) 5 MG tablet Take 1 tablet (5 mg total) by mouth 2 (two) times daily before a meal.   lisinopril (ZESTRIL) 10 MG tablet Take 1 tablet (10 mg total) by mouth daily.   pravastatin (PRAVACHOL) 40 MG tablet Take 1 tablet (40 mg total) by mouth at bedtime.   sitaGLIPtin (JANUVIA) 100 MG tablet Take 1 tablet (100 mg total) by mouth daily.   umeclidinium bromide (INCRUSE ELLIPTA) 62.5 MCG/INH AEPB Inhale 1 puff into the lungs daily.   varenicline (CHANTIX) 0.5 MG tablet Take 1 tablet once daily for 3 days and then 1 tablet twice daily for next 4 days.   [DISCONTINUED] metFORMIN (GLUCOPHAGE) 500 MG tablet Take 1 tablet (500 mg total) by mouth 2 (two) times daily with a meal.   nitroGLYCERIN (NITROSTAT) 0.4 MG SL tablet Place 1 tablet (0.4 mg total) under the tongue every 5 (five) minutes x 3 doses as needed for chest pain (if no relief after 3rd dose, proceed to the ED for an evaluation).   [DISCONTINUED] predniSONE (STERAPRED UNI-PAK 21 TAB) 10 MG (21) TBPK tablet Take as package instructions. (Patient not taking: Reported on 03/11/2021)   [DISCONTINUED] varenicline (CHANTIX CONTINUING MONTH PAK) 1 MG tablet Take 1 tablet (1  mg total) by mouth 2 (two) times daily. (Patient not taking: Reported on 03/11/2021)   No facility-administered encounter medications on file as of 03/11/2021.    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   Chronic  back pain    COPD (chronic obstructive pulmonary disease) (HCC)    Depression    Essential hypertension    Hyperlipidemia    Type 2 diabetes mellitus ([place])    Past Surgical History:  Procedure Laterality Date   BREAST BIOPSY Left    COLONOSCOPY N/A 04/26/2018   Procedure: COLONOSCOPY;  Surgeon: Rogene Houston, MD;  [facility];  Service: Endoscopy;  Laterality: N/A;  1:15   INTRAVASCULAR PRESSURE WIRE/FFR STUDY N/A 05/12/2018   Procedure: INTRAVASCULAR PRESSURE WIRE/FFR STUDY;  Surgeon: Nelva Bush, MD;  Location: [facility];  Service: Cardiovascular;  Laterality: N/A;   LEFT HEART CATH AND CORONARY ANGIOGRAPHY N/A 05/12/2018   Procedure: LEFT HEART CATH AND CORONARY ANGIOGRAPHY;  Surgeon: Nelva Bush, MD;  Location: [facility];  Service: Cardiovascular;  Laterality: N/A;   POLYPECTOMY  04/26/2018   Procedure: POLYPECTOMY;  Surgeon: Rogene Houston, MD;  Location: [facility];  Service: Endoscopy;;  colon    TOTAL VAGINAL HYSTERECTOMY     Family History  Problem Relation Age of Onset   Hypertension Father    Breast cancer Paternal Aunt    Breast cancer Paternal Grandmother    Breast cancer Paternal Aunt    Social History   Socioeconomic History   Marital status: Married    Spouse name: Not on file   Number of children: Not on file   Years of education: Not on file   Highest education level: Not on file  Occupational History   Not on file  Tobacco Use   Smoking status: Every Day    Packs/day: 1.00    Types: Cigarettes   Smokeless tobacco: Never   Tobacco comments:    cut back to 1 pack a day. Smokig x 109yr. Did smoke 2 packs x 5 yrs, but cut back a month ago.   Vaping Use   Vaping Use: Never used  Substance and Sexual Activity   Alcohol use: Never   Drug use: Never   Sexual activity: Not on file  Other Topics Concern   Not on file  Social History Narrative   Not on file   Social Determinants of Health   Financial  Resource Strain: Low Risk    Difficulty of Paying Living Expenses: Not hard at all  Food Insecurity: No Food Insecurity   Worried About RCharity fundraiserin the Last Year: Never true   RCrestonin the Last Year: Never true  Transportation Needs: No Transportation Needs   Lack of Transportation (Medical): No   Lack of Transportation (Non-Medical): No  Physical Activity: Insufficiently Active   Days of Exercise per Week: 3 days   Minutes of Exercise per Session: 30 min  Stress: No Stress Concern Present   Feeling of Stress : Only a little  Social Connections: Moderately Isolated   Frequency of Communication with Friends and Family: More than three times a week   Frequency of Social Gatherings with Friends and Family: More than three times a week   Attends Religious Services: More than 4 times per year   Active Member  of Clubs or Organizations: No   Attends Archivist Meetings: Never   Marital Status: Separated    Tobacco Counseling Ready to quit: Yes Counseling given: Yes Tobacco comments: cut back to 1 pack a day. Smokig x 64yr. Did smoke 2 packs x 5 yrs, but cut back a month ago.    Clinical Intake:  Pre-visit preparation completed: Yes  Pain : No/denies pain Pain Score: 0-No pain     BMI - recorded: 30.65 Nutritional Status: BMI > 30  Obese Nutritional Risks: None Diabetes: Yes CBG done?: No Did pt. 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  No  Does the patient have any non-healing wounds?  No  Has the patient had any unintentional weight loss or weight gain?  No   Diabetes:  Is the patient diabetic?  Yes  If diabetic, was  a CBG obtained today?  No  Did the patient bring in their glucometer from home?  No  How often do you monitor your CBG's? 1 time daily.   Financial Strains and Diabetes Management:  Are you having any financial strains with the device, your supplies or your medication? No .  Does the patient want to be seen by Chronic Care Management for management of their diabetes?  No  Would the patient like to be referred to a Nutritionist or for Diabetic Management?  No   Diabetic Exams:  Diabetic Eye Exam:.Overdue for diabetic eye exam. Pt has been advised about the importance in completing this exam. A referral has been placed today. Message sent to referral coordinator for scheduling purposes. Advised pt to expect a call from office referred to regarding appt.  Diabetic Foot Exam: Completed 03-11-21. Pt has been advised about the importance in completing this exam.    Interpreter Needed?: No      Activities of Daily Living In your present state of health, do you have any difficulty performing the following activities: 03/11/2021  Hearing? N  Vision? Y  Difficulty concentrating or making decisions? N  Walking or climbing stairs? Y  Dressing or bathing? N  Doing errands, shopping? N  Preparing Food and eating ? N  Using the Toilet? N  In the past six months, have you accidently leaked urine? N  Do you have problems with loss of bowel control? N  Managing your Medications? N  Managing your Finances? N  Housekeeping or managing your Housekeeping? N  Some recent data might be hidden    Patient Care Team: PLindell Spar MD as PCP - General (Internal Medicine) MSatira Sark MD as PCP - Cardiology (Cardiology)  Indicate any recent Medical Services you may have received from other than [facility] providers in the past year (date may be approximate).     Assessment:   This is a routine wellness examination for Lauren David  Hearing/Vision screen Vision Screening   Right eye Left eye Both  eyes  Without correction 0 20/30 20/40  With correction       Dietary issues and exercise activities discussed: Current Exercise Habits: Home exercise routine, Type of exercise: walking, Time (Minutes): 30, Frequency (Times/Week): 3, Weekly Exercise (Minutes/Week): 90, Intensity: Mild, Exercise limited  by: None identified   Goals Addressed             This Visit's Progress    Patient Stated       She would like to be more compliant with healthcare       Depression Screen PHQ 2/9 Scores 03/11/2021 03/11/2021 09/10/2020  PHQ - 2 Score 0 0 0    Fall Risk Fall Risk  03/11/2021 09/10/2020  Falls in the past year? 0 0  Number falls in past yr: 0 0  Injury with Fall? 0 0  Risk for fall due to : No Fall Risks No Fall Risks  Follow up Falls evaluation completed Falls evaluation completed    [place]:  Any stairs in or around the home? Yes  If so, are there any without handrails? Yes  Home free of loose throw rugs in walkways, pet beds, electrical cords, etc? Yes  Adequate lighting in your home to reduce risk of falls? Yes   ASSISTIVE DEVICES UTILIZED TO PREVENT FALLS:  Life alert? Yes  Use of a cane, walker or w/c? No  Grab bars in the bathroom? Yes  Shower chair or bench in shower? No  Elevated toilet seat or a handicapped toilet? No   TIMED UP AND GO:  Was the test performed? No .  Length of time to ambulate 10 feet: NA sec.     Cognitive Function: MMSE - Mini Mental State Exam 03/11/2021  Not completed: Unable to complete     6CIT Screen 03/11/2021  What Year? 0 points  What month? 0 points  What time? 0 points  Count back from 20 0 points  Months in reverse 0 points  Repeat phrase 0 points  Total Score 0    Immunizations Immunization History  Administered Date(s) Administered   Influenza,inj,Quad PF,6+ Mos 09/10/2020   Moderna Sars-Covid-2 Vaccination 08/10/2019, 10/20/2019, 11/18/2019   PFIZER(Purple Top)SARS-COV-2  Vaccination 10/20/2019    TDAP status: Due, Education has been provided regarding the importance of this vaccine. Advised may receive this vaccine at local pharmacy or Health Dept. Aware to provide a copy of the vaccination record if obtained from local pharmacy or Health Dept. Verbalized acceptance and understanding.  Flu Vaccine status: Due, Education has been provided regarding the importance of this vaccine. Advised may receive this vaccine at local pharmacy or Health Dept. Aware to provide a copy of the vaccination record if obtained from local pharmacy or Health Dept. Verbalized acceptance and understanding.  Pneumococcal vaccine status: Up to date  Covid-19 vaccine status: Completed vaccines  Qualifies for Shingles Vaccine? Yes   Zostavax completed No   Shingrix Completed?: No.    Education has been provided regarding the importance of this vaccine. Patient has been advised to call insurance company to determine out of pocket expense if they have not yet received this vaccine. Advised may also receive vaccine at local pharmacy or Health Dept. Verbalized acceptance and understanding.  Screening Tests Health Maintenance  Topic Date Due   PNEUMOCOCCAL POLYSACCHARIDE VACCINE AGE 49-64 HIGH RISK  Never done   Pneumococcal Vaccine 77-34 Years old (1 - PCV) Never done   OPHTHALMOLOGY EXAM  Never done   HIV Screening  Never done   Hepatitis C Screening  Never done   TETANUS/TDAP  Never done   PAP SMEAR-Modifier  Never done   Zoster Vaccines- Shingrix (1 of 2) Never done   COVID-19 Vaccine (4 - Booster) 03/19/2020   INFLUENZA VACCINE  03/04/2021   HEMOGLOBIN A1C  07/26/2021   FOOT EXAM  03/11/2022   MAMMOGRAM  12/11/2022   COLONOSCOPY (Pts 45-56yr Insurance coverage will need to be confirmed)  04/26/2025   HPV VACCINES  Aged Out    Health Maintenance  Health Maintenance Due  Topic Date Due   PNEUMOCOCCAL POLYSACCHARIDE VACCINE AGE 1-64 HIGH RISK  Never done   Pneumococcal Vaccine  065661Years old (1 - PCV) Never done   OPHTHALMOLOGY EXAM  Never done   HIV Screening  Never done   Hepatitis C Screening  Never done   TETANUS/TDAP  Never done   PAP SMEAR-Modifier  Never done   Zoster Vaccines- Shingrix (1 of 2) Never done   COVID-19 Vaccine (4 - Booster) 03/19/2020   INFLUENZA VACCINE  03/04/2021    Colorectal cancer screening: Type of screening: Colonoscopy. Completed 04-26-18. Repeat every 7 years  Mammogram status: Completed 01-01-21. Repeat every year  Bone Density status: Ordered 03-11-21. Pt provided with contact info and advised to call to schedule appt.  Lung Cancer Screening: (Low Dose CT Chest recommended if Age 62-80years, 30 pack-year currently smoking OR have quit w/in 15years.) does qualify.   Lung Cancer Screening Referral: Placed   Additional Screening:  Hepatitis C Screening: does qualify; [facility]: Recommended annual ophthalmology exams for early detection of glaucoma and other disorders of the eye. Is the patient up to date with their annual eye exam?  No  Who is the provider or what is the name of the office in which the patient attends annual eye exams? [facility]  If pt is not established with a provider, would they like to be referred to a provider to establish care? No .   Dental Screening: Recommended annual dental exams for proper oral hygiene  Community Resource Referral / Chronic Care Management: CRR required this visit?  No   CCM required this visit?  No      Plan:     I have personally reviewed and noted the following in the patient's chart:   Medical and social history Use of alcohol, tobacco or illicit drugs  Current medications and supplements including opioid prescriptions.  Functional ability and status Nutritional status Physical activity Advanced directives List of other physicians Hospitalizations, surgeries, and ER visits in previous 12 months Vitals Screenings to  include cognitive, depression, and falls Referrals and appointments  In addition, I have reviewed and discussed with patient certain preventive protocols, quality metrics, and best practice recommendations. A written personalized care plan for preventive services as well as general preventive health recommendations were provided to patient.     RLindell Spar MD   03/11/2021   Nurse Notes: Patient was in office for visit.   ------------------------------------------------------------------------------------------ I have reviewed and agree with the above AWV documentation.  Primary hypertension BP Readings from Last 1 Encounters:  03/11/21 (!) 155/78    uncontrolled with Amlodipine, Lisinopril and Bisoprolol Increased Amlodipine to 5 mg QD and Lisinopril to 10 mg QD Counseled for compliance with the medications Advised DASH diet and moderate exercise/walking, at least 150 mins/week  EKG: Sinus rhythm. HR: 83. Pathological q waves in II, III and aVF, old inferior infarct. Baseline wander in multiple leads. No signs of active ischemia.  Type 2 diabetes mellitus without complication (HCC) Lab Results  Component Value Date   HGBA1C 11.7 (H) 01/24/2021   Uncontrolled due to noncompliance ON Januvia 100 mg QD Does not tolerate Metformin, has  severe diarrhea Started Glipizide Advised to follow diabetic diet On ACEi F/u CMP and lipid panel Diabetic eye exam: Advised to follow up with Ophthalmology for diabetic eye exam   Ihor Dow, MD [place]

## 2021-03-11 NOTE — Assessment & Plan Note (Signed)
BP Readings from Last 1 Encounters:  03/11/21 (!) 155/78    uncontrolled with Amlodipine, Lisinopril and Bisoprolol Increased Amlodipine to 5 mg QD and Lisinopril to 10 mg QD Counseled for compliance with the medications Advised DASH diet and moderate exercise/walking, at least 150 mins/week  EKG: Sinus rhythm. HR: 83. Pathological q waves in II, III and aVF, old inferior infarct. Baseline wander in multiple leads. No signs of active ischemia.

## 2021-03-11 NOTE — Patient Instructions (Signed)
Lauren David , Thank you for taking time to come for your Medicare Wellness Visit. I appreciate your ongoing commitment to your health goals. Please review the following plan we discussed and let me know if I can assist you in the future.   Screening recommendations/referrals: Colonoscopy: Completed Due 2026 Mammogram: Completed Due 2023 Bone Density: Ordered today Recommended yearly ophthalmology/optometry visit for glaucoma screening and checkup Recommended yearly dental visit for hygiene and checkup  Vaccinations: Influenza vaccine: Due now Pneumococcal vaccine: Patient stated completed Tdap vaccine: Due now  Shingles vaccine: Due now     Advanced directives: Information provided  Conditions/risks identified: Hypertension, Diabetes  Next appointment: 1 year   Preventive Care 40-64 Years, Female Preventive care refers to lifestyle choices and visits with your health care provider that can promote health and wellness. What does preventive care include? A yearly physical exam. This is also called an annual well check. Dental exams once or twice a year. Routine eye exams. Ask your health care provider how often you should have your eyes checked. Personal lifestyle choices, including: Daily care of your teeth and gums. Regular physical activity. Eating a healthy diet. Avoiding tobacco and drug use. Limiting alcohol use. Practicing safe sex. Taking low-dose aspirin daily starting at age 50. Taking vitamin and mineral supplements as recommended by your health care provider. What happens during an annual well check? The services and screenings done by your health care provider during your annual well check will depend on your age, overall health, lifestyle risk factors, and family history of disease. Counseling  Your health care provider may ask you questions about your: Alcohol use. Tobacco use. Drug use. Emotional well-being. Home and relationship well-being. Sexual  activity. Eating habits. Work and work environment. Method of birth control. Menstrual cycle. Pregnancy history. Screening  You may have the following tests or measurements: Height, weight, and BMI. Blood pressure. Lipid and cholesterol levels. These may be checked every 5 years, or more frequently if you are over 50 years old. Skin check. Lung cancer screening. You may have this screening every year starting at age 55 if you have a 30-pack-year history of smoking and currently smoke or have quit within the past 15 years. Fecal occult blood test (FOBT) of the stool. You may have this test every year starting at age 50. Flexible sigmoidoscopy or colonoscopy. You may have a sigmoidoscopy every 5 years or a colonoscopy every 10 years starting at age 50. Hepatitis C blood test. Hepatitis B blood test. Sexually transmitted disease (STD) testing. Diabetes screening. This is done by checking your blood sugar (glucose) after you have not eaten for a while (fasting). You may have this done every 1-3 years. Mammogram. This may be done every 1-2 years. Talk to your health care provider about when you should start having regular mammograms. This may depend on whether you have a family history of breast cancer. BRCA-related cancer screening. This may be done if you have a family history of breast, ovarian, tubal, or peritoneal cancers. Pelvic exam and Pap test. This may be done every 3 years starting at age 21. Starting at age 30, this may be done every 5 years if you have a Pap test in combination with an HPV test. Bone density scan. This is done to screen for osteoporosis. You may have this scan if you are at high risk for osteoporosis. Discuss your test results, treatment options, and if necessary, the need for more tests with your health care provider. Vaccines  Your   health care provider may recommend certain vaccines, such as: Influenza vaccine. This is recommended every year. Tetanus, diphtheria,  and acellular pertussis (Tdap, Td) vaccine. You may need a Td booster every 10 years. Zoster vaccine. You may need this after age 60. Pneumococcal 13-valent conjugate (PCV13) vaccine. You may need this if you have certain conditions and were not previously vaccinated. Pneumococcal polysaccharide (PPSV23) vaccine. You may need one or two doses if you smoke cigarettes or if you have certain conditions. Talk to your health care provider about which screenings and vaccines you need and how often you need them. This information is not intended to replace advice given to you by your health care provider. Make sure you discuss any questions you have with your health care provider. Document Released: 08/17/2015 Document Revised: 04/09/2016 Document Reviewed: 05/22/2015 Elsevier Interactive Patient Education  2017 Else[employer] Inc.    Fall Prevention in the Home Falls can cause injuries. They can happen to people of all ages. There are many things you can do to make your home safe and to help prevent falls. What can I do on the outside of my home? Regularly fix the edges of walkways and driveways and fix any cracks. Remove anything that might make you trip as you walk through a door, such as a raised step or threshold. Trim any bushes or trees on the path to your home. Use bright outdoor lighting. Clear any walking paths of anything that might make someone trip, such as rocks or tools. Regularly check to see if handrails are loose or broken. Make sure that both sides of any steps have handrails. Any raised decks and porches should have guardrails on the edges. Have any leaves, snow, or ice cleared regularly. Use sand or salt on walking paths during winter. Clean up any spills in your garage right away. This includes oil or grease spills. What can I do in the bathroom? Use night lights. Install grab bars by the toilet and in the tub and shower. Do not use towel bars as grab bars. Use non-skid mats or  decals in the tub or shower. If you need to sit down in the shower, use a plastic, non-slip stool. Keep the floor dry. Clean up any water that spills on the floor as soon as it happens. Remove soap buildup in the tub or shower regularly. Attach bath mats securely with double-sided non-slip rug tape. Do not have throw rugs and other things on the floor that can make you trip. What can I do in the bedroom? Use night lights. Make sure that you have a light by your bed that is easy to reach. Do not use any sheets or blankets that are too big for your bed. They should not hang down onto the floor. Have a firm chair that has side arms. You can use this for support while you get dressed. Do not have throw rugs and other things on the floor that can make you trip. What can I do in the kitchen? Clean up any spills right away. Avoid walking on wet floors. Keep items that you use a lot in easy-to-reach places. If you need to reach something above you, use a strong step stool that has a grab bar. Keep electrical cords out of the way. Do not use floor polish or wax that makes floors slippery. If you must use wax, use non-skid floor wax. Do not have throw rugs and other things on the floor that can make you trip. What can   I do with my stairs? Do not leave any items on the stairs. Make sure that there are handrails on both sides of the stairs and use them. Fix handrails that are broken or loose. Make sure that handrails are as long as the stairways. Check any carpeting to make sure that it is firmly attached to the stairs. Fix any carpet that is loose or worn. Avoid having throw rugs at the top or bottom of the stairs. If you do have throw rugs, attach them to the floor with carpet tape. Make sure that you have a light switch at the top of the stairs and the bottom of the stairs. If you do not have them, ask someone to add them for you. What else can I do to help prevent falls? Wear shoes that: Do not  have high heels. Have rubber bottoms. Are comfortable and fit you well. Are closed at the toe. Do not wear sandals. If you use a stepladder: Make sure that it is fully opened. Do not climb a closed stepladder. Make sure that both sides of the stepladder are locked into place. Ask someone to hold it for you, if possible. Clearly mark and make sure that you can see: Any grab bars or handrails. First and last steps. Where the edge of each step is. Use tools that help you move around (mobility aids) if they are needed. These include: Canes. Walkers. Scooters. Crutches. Turn on the lights when you go into a dark area. Replace any light bulbs as soon as they burn out. Set up your furniture so you have a clear path. Avoid moving your furniture around. If any of your floors are uneven, fix them. If there are any pets around you, be aware of where they are. Review your medicines with your doctor. Some medicines can make you feel dizzy. This can increase your chance of falling. Ask your doctor what other things that you can do to help prevent falls. This information is not intended to replace advice given to you by your health care provider. Make sure you discuss any questions you have with your health care provider. Document Released: 05/17/2009 Document Revised: 12/27/2015 Document Reviewed: 08/25/2014 Elsevier Interactive Patient Education  2017 Elsevier Inc.  

## 2021-03-11 NOTE — Assessment & Plan Note (Signed)
Lab Results  Component Value Date   HGBA1C 11.7 (H) 01/24/2021   Uncontrolled due to noncompliance ON Januvia 100 mg QD Does not tolerate Metformin, has severe diarrhea Started Glipizide Advised to follow diabetic diet On ACEi F/u CMP and lipid panel Diabetic eye exam: Advised to follow up with Ophthalmology for diabetic eye exam

## 2021-03-22 ENCOUNTER — Encounter: Payer: Self-pay | Admitting: *Deleted

## 2021-03-22 ENCOUNTER — Encounter: Payer: Self-pay | Admitting: Family Medicine

## 2021-03-22 ENCOUNTER — Ambulatory Visit (INDEPENDENT_AMBULATORY_CARE_PROVIDER_SITE_OTHER): Payer: Medicare Other | Admitting: Family Medicine

## 2021-03-22 ENCOUNTER — Other Ambulatory Visit: Payer: Self-pay

## 2021-03-22 VITALS — BP 158/72 | HR 80 | Ht 61.0 in | Wt 165.8 lb

## 2021-03-22 DIAGNOSIS — E1165 Type 2 diabetes mellitus with hyperglycemia: Secondary | ICD-10-CM

## 2021-03-22 DIAGNOSIS — I10 Essential (primary) hypertension: Secondary | ICD-10-CM | POA: Diagnosis not present

## 2021-03-22 DIAGNOSIS — E782 Mixed hyperlipidemia: Secondary | ICD-10-CM

## 2021-03-22 DIAGNOSIS — R079 Chest pain, unspecified: Secondary | ICD-10-CM | POA: Diagnosis not present

## 2021-03-22 DIAGNOSIS — I2510 Atherosclerotic heart disease of native coronary artery without angina pectoris: Secondary | ICD-10-CM

## 2021-03-22 DIAGNOSIS — Z720 Tobacco use: Secondary | ICD-10-CM

## 2021-03-22 MED ORDER — ROSUVASTATIN CALCIUM 10 MG PO TABS: 10.0000 mg | ORAL_TABLET | Freq: Every day | ORAL | 6 refills | Status: DC

## 2021-03-22 MED ORDER — AMLODIPINE BESYLATE 10 MG PO TABS: 10.0000 mg | ORAL_TABLET | Freq: Every day | ORAL | 6 refills | Status: DC

## 2021-03-22 MED ORDER — NITROGLYCERIN 0.4 MG SL SUBL: 0.4000 mg | SUBLINGUAL_TABLET | SUBLINGUAL | 3 refills | Status: DC | PRN

## 2021-03-22 NOTE — Patient Instructions (Addendum)
Medication Instructions: Increase Amlodipine to '10mg'$  daily.   Stop Pravastatin. Begin Crestor '10mg'$  daily.    Continue all other current medications.   Labwork: FLP, LFT - due in 8-12 weeks. Will mail order when time.   Testing/Procedures: Your physician has requested that you have a lexiscan myoview. For further information please visit [email or web address]. Please follow instruction sheet, as given. Office will contact with results via phone or letter.     Follow-Up: 6 - 8 weeks   Any Other Special Instructions Will Be Listed Below (If Applicable).    If you need a refill on your cardiac medications before your next appointment, please call your pharmacy.

## 2021-03-22 NOTE — Progress Notes (Addendum)
Cardiology Office Note  Date: 04/10/2021   ID: Nijaya, Baumeister 1961/03/01, MRN [id number]  PCP:  Lindell Spar, MD  Cardiologist:  Rozann Lesches, MD Electrophysiologist:  None   Chief Complaint: 6 month follow up  History of Present Illness: Lauren David is a 60 y.o. female with a history of CAD, COPD, HTN, HLD, DM2.   She was last seen by Dr. Domenic Polite 05/25/2020 for routine visit.  She reported fatigue, back pain and pleuritic chest discomfort with hip pain.  She had not yet established with a PCP.  Her medications were reviewed and she needed refills.  Dr. Domenic Polite agreed to refill her diabetes medications until she found the PCP.  He arranged follow-up lab work. She had moderate multivessel CAD being managed medically.  EKG was normal that day.  She was continuing aspirin, Norvasc, Zebeta, lisinopril and Pravachol.  She also had as needed nitroglycerin available.  He ordered FLP's and LFTs.  Blood pressure control was reasonable that day.  Plans to check a basic metabolic panel.  She was provided with phone numbers for PCPs in the area for her to make contact and establish in the future.  Her labs on 01/24/2021 showed elevated glucose of 272.  BUN of 7, creatinine of 0.65, total cholesterol 251, HDL 48, triglycerides 131, LDL 179, vitamin D 9.1, CBC unremarkable, hemoglobin A1c 11.7 with estimated average glucose of 289, TSH of 1.38  She is here today for 49-monthfollow-up.  She states she recently [facility] and saw him on March 11, 2021.  He ordered a CMP and lipid panel.  Her diabetes was under controlled.  Recent hemoglobin A1c was 11.7.  Per Dr. PPosey Prontoblood sugar was uncontrolled due to noncompliance.  She was on Januvia 100 mg daily.  She was intolerant of metformin due to diarrhea.  He had started her on glipizide.  She was on ACE inhibitor.  Her blood pressure was elevated at 155/78 during that visit.  Blood pressure today remains  elevated at 158/72.  She states she does not check her blood pressures at home.  She has been having some chest pain and some lower back pain.  She denies any radiation to neck, arm, or jaw.  She denies any nausea, vomiting, or sweating.     Past Medical History:  Diagnosis Date   Anxiety    CAD (coronary artery disease)    Multivessel disease 05/2018   Chronic back pain    COPD (chronic obstructive pulmonary disease) (HCC)    Depression    Essential hypertension    Hyperlipidemia    Type 2 diabetes mellitus ([facility]     Past Surgical History:  Procedure Laterality Date   BREAST BIOPSY Left    COLONOSCOPY N/A 04/26/2018   Procedure: COLONOSCOPY;  Surgeon: RRogene Houston MD;  [facility];  Service: Endoscopy;  Laterality: N/A;  1:15   INTRAVASCULAR PRESSURE WIRE/FFR STUDY N/A 05/12/2018   Procedure: INTRAVASCULAR PRESSURE WIRE/FFR STUDY;  Surgeon: ENelva Bush MD;  [facility];  Service: Cardiovascular;  Laterality: N/A;   LEFT HEART CATH AND CORONARY ANGIOGRAPHY N/A 05/12/2018   Procedure: LEFT HEART CATH AND CORONARY ANGIOGRAPHY;  Surgeon: ENelva Bush MD;  [facility];  Service: Cardiovascular;  Laterality: N/A;   POLYPECTOMY  04/26/2018   Procedure: POLYPECTOMY;  Surgeon: RRogene Houston MD;  [facility];  Service: Endoscopy;;  colon  TOTAL VAGINAL HYSTERECTOMY      Current Outpatient Medications  Medication Sig Dispense Refill   albuterol (PROVENTIL) (2.5 MG/3ML) 0.083% nebulizer solution ONE VIAL BY NEBULIZATION EVERY 6 HOURS AS NEEDED FOR WHEEZING OR SHORTNESS OF BREATH. 180 mL 0   aspirin EC 81 MG tablet Take 1 tablet (81 mg total) by mouth daily. 90 tablet 3   bisoprolol (ZEBETA) 5 MG tablet Take 0.5 tablets (2.5 mg total) by mouth daily. 45 tablet 3   dicyclomine (BENTYL) 10 MG capsule Take 1 capsule (10 mg total) by mouth 3 (three) times daily as needed. 60 capsule 1   glipiZIDE (GLUCOTROL) 5 MG tablet  Take 1 tablet (5 mg total) by mouth 2 (two) times daily before a meal. 60 tablet 3   lisinopril (ZESTRIL) 10 MG tablet Take 1 tablet (10 mg total) by mouth daily. 90 tablet 0   rosuvastatin (CRESTOR) 10 MG tablet Take 1 tablet (10 mg total) by mouth daily. 30 tablet 6   sitaGLIPtin (JANUVIA) 100 MG tablet Take 1 tablet (100 mg total) by mouth daily. 90 tablet 0   umeclidinium bromide (INCRUSE ELLIPTA) 62.5 MCG/INH AEPB Inhale 1 puff into the lungs daily. 30 each 2   albuterol (VENTOLIN HFA) 108 (90 Base) MCG/ACT inhaler INHALE 2 PUFFS INTO THE LUNGS EVERY 4 HOURS AS NEEDED FOR WHEEZING OR SHORTNESS OF BREATH. 17 g 0   amLODipine (NORVASC) 10 MG tablet Take 1 tablet (10 mg total) by mouth daily. 30 tablet 6   nitroGLYCERIN (NITROSTAT) 0.4 MG SL tablet Place 1 tablet (0.4 mg total) under the tongue every 5 (five) minutes x 3 doses as needed for chest pain (if no relief after 3rd dose, proceed to the ED for an evaluation). 25 tablet 3   varenicline (CHANTIX) 0.5 MG tablet Take 1 tablet once daily for 3 days and then 1 tablet twice daily for next 4 days. (Patient not taking: Reported on 03/22/2021) 11 tablet 0   No current facility-administered medications for this visit.   Allergies:  Other and Percocet [oxycodone-acetaminophen]   Social History: The patient  reports that she has been smoking cigarettes. She has been smoking an average of 1 pack per day. She has never used smokeless tobacco. She reports that she does not drink alcohol and does not use drugs.   Family History: The patient's family history includes Breast cancer in her paternal aunt, paternal aunt, and paternal grandmother; Hypertension in her father.   ROS:  Please see the history of present illness. Otherwise, complete review of systems is positive for none.  All other systems are reviewed and negative.   Physical Exam: VS:  BP (!) 158/72   Pulse 80   Ht '5\' 1"'$  (1.549 m)   Wt 165 lb 12.8 oz (75.2 kg)   SpO2 96%   BMI 31.33 kg/m  , BMI Body mass index is 31.33 kg/m.  Wt Readings from Last 3 Encounters:  03/22/21 165 lb 12.8 oz (75.2 kg)  03/11/21 162 lb 1.9 oz (73.5 kg)  09/10/20 164 lb 12.8 oz (74.8 kg)    General: Patient appears comfortable at rest. HEENT: Conjunctiva and lids normal, oropharynx clear with moist mucosa. Neck: Supple, no elevated JVP or carotid bruits, no thyromegaly. Lungs: Clear to auscultation, nonlabored breathing at rest. Cardiac: Regular rate and rhythm, no S3 or significant systolic murmur, no pericardial rub. Abdomen: Soft, nontender, no hepatomegaly, bowel sounds present, no guarding or rebound. Extremities: No pitting edema, distal pulses 2+. Skin: Warm and dry.  Musculoskeletal: No kyphosis. Neuropsychiatric: Alert and oriented x3, affect grossly appropriate.  ECG:    Recent Labwork: 01/24/2021: ALT 18; AST 13; BUN 7; Creatinine, Ser 0.65; Hemoglobin 14.6; Platelets 279; Potassium 4.3; Sodium 139; TSH 1.380     Component Value Date/Time   CHOL 251 (H) 01/24/2021 0937   TRIG 131 01/24/2021 0937   HDL 48 01/24/2021 0937   CHOLHDL 5.2 (H) 01/24/2021 0937   LDLCALC 179 (H) 01/24/2021 0937    Other Studies Reviewed Today:   Cardiac catheterization 05/12/2018: Conclusions: Moderate to severe 3-vessel coronary artery disease, including 50% proximal LAD, 70% mid LCx, and 60% mid RCA stenoses.  FFR of LAD and RCA lesions is borderline positive (FFR 0.80-0.81).  Mid/distal LCx is small and supplies a relatively small territory. Mildly elevated left ventricular filling pressure with normal left ventricular systolic function.  Recommendations: Aggressive medical therapy.  I will add isosorbide mononitrate 30 mg daily.  Tobacco cessation and better control of her diabetes are critical. If patient has persistent symptoms and/or worsening of her coronary artery disease, surgical consultation would need to be considered, given three-vessel disease involving the proximal LAD and the  patient's history of diabetes.  However, I am not sure that the distal LCx is graftable, given its relatively small size.   Recommend Aspirin '81mg'$  daily for moderate CAD.   Diagnostic Dominance: Right      Assessment and Plan:  1. CAD in native artery   2. Mixed hyperlipidemia   3. Essential hypertension   4. Chest pain of uncertain etiology   5. Tobacco abuse   6. Uncontrolled type 2 diabetes mellitus with hyperglycemia ([place])    1. CAD in native artery. Cardiac catheterization 2019 demonstrated moderate to severe three-vessel CAD including 50% proximal LAD, 70% mid circumflex, and a 60% mid RCA stenosis.  FFR of LAD and RCA lesion was borderline positive at (FFR 0.80-0.81.  Mid/distal circumflex was small and supplied a relatively small territory.  Aggressive medical therapy was recommended in addition to adding Imdur 30 mg daily.  She was encouraged tobacco cessation and better control of diabetes.  Today she complains of episodes of chest pain and back pain.  She states she does not have a current bottle of sublingual nitroglycerin.  Please get a Lexiscan stress test.  Continue aspirin 81 mg daily, refill sublingual nitroglycerin.  2. Mixed hyperlipidemia Recent lipid panel showed LDL of 179 on the labs on January 24, 2021.  Please stop pravastatin and start Crestor 10 mg daily.  Get a follow-up FLP and LFT in 8 to 10 weeks after initiating Crestor.    3. Essential hypertension Blood pressure has been elevated over the last 2 visits.  Recent blood pressure at Dr. Serita Grit office was 155/78.  Blood pressure today is 158/72.  Increase amlodipine to 10 mg daily.  Continue bisoprolol 2.5 mg p.o. daily.  Continue lisinopril 10 mg daily.  4.  Chest pain of uncertain etiology She is complaining of recent chest pain with or without exertion and with some radiation to her back.  She denies any associated nausea, vomiting, or diaphoresis.  Apparently sublingual nitroglycerin has expired.  Please  refill sublingual nitroglycerin with a fresh bottle.  Given history of uncontrolled diabetes, history of CAD on prior catheterization, history of smoking and recent chest pain along with uncontrolled hyperlipidemia please get a Lexiscan stress test.  Recommendations after cardiac catheterization on October 2019 was that she be on Imdur.  This is not included on her medication list  today.  It appears it was discontinued due to side effects previously.  5.  Smoking Long history of smoking.  She was just started on Chantix by primary care provider.  6.  Uncontrolled type 2 diabetes. [facility]  Recent hemoglobin A1c was 11.7.  Dr. Posey Pronto started her on glipizide.  She could not tolerate metformin due to side effects with mainly diarrhea.  Also on Januvia 100 mg daily.  Medication Adjustments/Labs and Tests Ordered: Current medicines are reviewed at length with the patient today.  Concerns regarding medicines are outlined above.   Disposition: Follow-up with Dr. Domenic Polite or APP 6 to 8 weeks  Signed, Levell July, NP 04/10/2021 4:55 PM    [facility] [place], [place], [place] [address] Phone: [phone]; Fax: [phone]

## 2021-03-23 ENCOUNTER — Other Ambulatory Visit: Payer: Self-pay | Admitting: Internal Medicine

## 2021-03-23 DIAGNOSIS — J449 Chronic obstructive pulmonary disease, unspecified: Secondary | ICD-10-CM

## 2021-04-05 ENCOUNTER — Ambulatory Visit (HOSPITAL_COMMUNITY)
Admission: RE | Admit: 2021-04-05 | Discharge: 2021-04-05 | Disposition: A | Discharge status: Home / Self Care | Payer: Medicare Other | Source: Ambulatory Visit | Attending: Family Medicine | Admitting: Family Medicine

## 2021-04-05 ENCOUNTER — Other Ambulatory Visit: Payer: Self-pay

## 2021-04-05 ENCOUNTER — Encounter (HOSPITAL_COMMUNITY): Payer: Self-pay

## 2021-04-05 ENCOUNTER — Encounter (HOSPITAL_COMMUNITY)
Admission: RE | Admit: 2021-04-05 | Discharge: 2021-04-05 | Disposition: A | Discharge status: Home / Self Care | Payer: Medicare Other | Source: Ambulatory Visit | Attending: Family Medicine | Admitting: Family Medicine

## 2021-04-05 DIAGNOSIS — R079 Chest pain, unspecified: Secondary | ICD-10-CM | POA: Diagnosis not present

## 2021-04-05 LAB — NM MYOCAR MULTI W/SPECT W/WALL MOTION / EF
LV dias vol: 79 mL (ref 46–106)
LV sys vol: 27 mL
Nuc Stress EF: 66 %
Peak HR: 95 {beats}/min
Rest HR: 67 {beats}/min
SDS: 0
SRS: 3
SSS: 3
ST Depression (mm): 0 mm

## 2021-04-05 MED ORDER — TECHNETIUM TC 99M TETROFOSMIN IV KIT
30.0000 | PACK | Freq: Once | INTRAVENOUS | Status: AC | PRN
  Administered 2021-04-05: 32.6 via INTRAVENOUS

## 2021-04-05 MED ORDER — TECHNETIUM TC 99M TETROFOSMIN IV KIT
10.0000 | PACK | Freq: Once | INTRAVENOUS | Status: AC | PRN
  Administered 2021-04-05: 10.4 via INTRAVENOUS

## 2021-04-05 MED ORDER — SODIUM CHLORIDE FLUSH 0.9 % IV SOLN
INTRAVENOUS | Status: AC
  Administered 2021-04-05: 10 mL via INTRAVENOUS
  Filled 2021-04-05: qty 10

## 2021-04-05 MED ORDER — REGADENOSON 0.4 MG/5ML IV SOLN
INTRAVENOUS | Status: AC
  Administered 2021-04-05: 0.4 mg via INTRAVENOUS
  Filled 2021-04-05: qty 5

## 2021-04-09 ENCOUNTER — Telehealth: Payer: Self-pay | Admitting: Family Medicine

## 2021-04-09 NOTE — Telephone Encounter (Signed)
Patient notified and verbalized understanding.   Appointment scheduled for Thursday, 04/11/2021 at 11:30 am with Katina Dung, NP in [place] office.

## 2021-04-09 NOTE — Telephone Encounter (Signed)
Verta Ellen., NP  04/08/2021  3:40 PM EDT     Please call the patient and let her know the stress test result considered low risk but "equivocal". Given her pre-existing heart disease on cardiac cathteterization she would likely have a low threshold for referral to cardiovascular surgery per last cardiac catheterization note. On her catheterization note in October 2019 she was started on Imdur 30 mg po daily. It appears she stopped Imdur due to side effects.  She is only taking SL NTG PRN. I will send this note to her primary cardiologist Dr. Domenic Polite.  He read her stress test. I will get his input on what we should do next.    She had moderate to severe 3 vessel disease on cardiac catheterization October 2019. Her LAD had 50% stenosis, Circumflex 70% , and RCA 60% stenosis.    The recommendation on cardiac catheterization note on October 2019 stated "If patient has persistent symptoms and / or worsening of coronary artery disease, surgical consultation would need to be considered, given 3 vessel disease involving the LAD and the patient's history of diabetes."        Verta Ellen, NP  04/08/2021 3:27 PM

## 2021-04-09 NOTE — Telephone Encounter (Signed)
New message    Patient calling for her stress test results , she doesn't understand what they mean. If you do not reach her on her home phone please try her cell phone as well.

## 2021-04-09 NOTE — Telephone Encounter (Signed)
Lauren Ellen., NP sent to Lauren Blazer, LPN Lauren David.  You may have already called the results to the patient.  As mentioned below Dr. Domenic Polite is recommending a follow-up cardiac catheterization based on likely anatomical changes since last cardiac catheterization and continued symptoms.  Would you please call her and let her know.  If she agrees to cardiac catheterization would need to come in so we can discuss risk and benefits i.e. informed consent.  Thank you        Previous Messages   ----- Message -----  From: Satira Sark, MD  Sent: 04/09/2021   8:07 AM EDT  To: Lauren Ellen., NP   Treatment recommendations can no longer be based on a cardiac catheterization from 2019 as her coronary anatomy could have changed since then.  Given the stress test results, if she is reporting angina symptoms would recommend a follow-up cardiac catheterization to better understand revascularization options.

## 2021-04-10 NOTE — Progress Notes (Signed)
Cardiology Office Note  Date: 04/11/2021   ID: Lauren David, Lauren David 1961-07-29, MRN [id number]  PCP:  Lindell Spar, MD  Cardiologist:  Rozann Lesches, MD Electrophysiologist:  None   Chief Complaint: Discuss cardiac catheterization risk and benefits  History of Present Illness: Lauren David is a 60 y.o. female with a history of CAD, COPD, HTN, HLD, DM2.   She was last seen by Dr. Domenic Polite 05/25/2020 for routine visit.  She reported fatigue, back pain and pleuritic chest discomfort with hip pain.  She had not yet established with a PCP.  Her medications were reviewed and she needed refills.  Dr. Domenic Polite agreed to refill her diabetes medications until she found the PCP.  He arranged follow-up lab work. She had moderate multivessel CAD being managed medically.  EKG was normal that day.  She was continuing aspirin, Norvasc, Zebeta, lisinopril and Pravachol.  She also had as needed nitroglycerin available.  He ordered FLP's and LFTs.  Blood pressure control was reasonable that day.  Plans to check a basic metabolic panel.  She was provided with phone numbers for PCPs in the area for her to make contact and establish in the future.  Her labs on 01/24/2021 showed elevated glucose of 272.  BUN of 7, creatinine of 0.65, total cholesterol 251, HDL 48, triglycerides 131, LDL 179, vitamin D 9.1, CBC unremarkable, hemoglobin A1c 11.7 with estimated average glucose of 289, TSH of 1.38  Was last here for 79-monthfollow-up.  She [facility] and [facility] him on March 11, 2021.  He ordered a CMP and lipid panel.  Her diabetes was under controlled.  Recent hemoglobin A1c was 11.7.  Per Dr. PPosey Prontoblood sugar was uncontrolled due to noncompliance.  She was on Januvia 100 mg daily.  She was intolerant of metformin due to diarrhea.  He had started her on glipizide.  She was on ACE inhibitor.  Her blood pressure was elevated at 155/78 during that visit.  Blood pressure  was elevated at 158/72.  She did  not check her blood pressures at home.  She has been having some chest pain and some lower back pain.  She denies any radiation to neck, arm, or jaw.  She denies any nausea, vomiting, or sweating.   She was last here for follow-up of recent stress test.  After speaking with her primary cardiologist he is recommending she undergo a cardiac catheterization given recent increase in chest pain and previous history of CAD on cardiac catheterization October 2019.  She had a cardiac catheterization in October 2019 which demonstrated moderate to severe three-vessel CAD including 50% proximal LAD, 70% mid circumflex, and 60% mid RCA stenosis.  [facility] of LAD and [facility] mid/distal circumflex was small and supplied a relatively small territory.  Dr. ESaunders Revelmentioned under his recommendation after cardiac catheterization if she had persistent symptoms and/or worsening of her coronary artery disease, surgical consultation would need to be considered given three-vessel disease involving proximal LAD and patient's history of diabetes.  He did state he was not sure of the distal LCx was graft doubled due to its relatively small size.   She is here today to discuss risk and benefits of cardiac catheterization and Dr. MMyles Giprecommendation that she have a follow-up cardiac catheterization based on continuing symptoms and likely changes in anatomy since last cardiac catheterization.  We discussed risks and benefits thoroughly.  She understands and  agrees to proceed.  She continues to have episodes of chest pain.   Past Medical History:  Diagnosis Date   Anxiety    CAD (coronary artery disease)    Multivessel disease 05/2018   Chronic back pain    COPD (chronic obstructive pulmonary disease) (HCC)    Depression    Essential hypertension    Hyperlipidemia    Type 2 diabetes mellitus ([place])     Past Surgical History:  Procedure Laterality Date    BREAST BIOPSY Left    COLONOSCOPY N/A 04/26/2018   Procedure: COLONOSCOPY;  Surgeon: Rogene Houston, MD;  [facility];  Service: Endoscopy;  Laterality: N/A;  1:15   INTRAVASCULAR PRESSURE WIRE/FFR STUDY N/A 05/12/2018   Procedure: INTRAVASCULAR PRESSURE WIRE/FFR STUDY;  Surgeon: Nelva Bush, MD;  Location: [facility];  Service: Cardiovascular;  Laterality: N/A;   LEFT HEART CATH AND CORONARY ANGIOGRAPHY N/A 05/12/2018   Procedure: LEFT HEART CATH AND CORONARY ANGIOGRAPHY;  Surgeon: Nelva Bush, MD;  Location: [facility];  Service: Cardiovascular;  Laterality: N/A;   POLYPECTOMY  04/26/2018   Procedure: POLYPECTOMY;  Surgeon: Rogene Houston, MD;  [facility];  Service: Endoscopy;;  colon    TOTAL VAGINAL HYSTERECTOMY      Current Outpatient Medications  Medication Sig Dispense Refill   albuterol (PROVENTIL) (2.5 MG/3ML) 0.083% nebulizer solution ONE VIAL BY NEBULIZATION EVERY 6 HOURS AS NEEDED FOR WHEEZING OR SHORTNESS OF BREATH. 180 mL 0   albuterol (VENTOLIN HFA) 108 (90 Base) MCG/ACT inhaler INHALE 2 PUFFS INTO THE LUNGS EVERY 4 HOURS AS NEEDED FOR WHEEZING OR SHORTNESS OF BREATH. 17 g 0   amLODipine (NORVASC) 10 MG tablet Take 1 tablet (10 mg total) by mouth daily. 30 tablet 6   aspirin EC 81 MG tablet Take 1 tablet (81 mg total) by mouth daily. 90 tablet 3   bisoprolol (ZEBETA) 5 MG tablet Take 0.5 tablets (2.5 mg total) by mouth daily. 45 tablet 3   dicyclomine (BENTYL) 10 MG capsule Take 1 capsule (10 mg total) by mouth 3 (three) times daily as needed. 60 capsule 1   glipiZIDE (GLUCOTROL) 5 MG tablet Take 1 tablet (5 mg total) by mouth 2 (two) times daily before a meal. 60 tablet 3   lisinopril (ZESTRIL) 10 MG tablet Take 1 tablet (10 mg total) by mouth daily. 90 tablet 0   nitroGLYCERIN (NITROSTAT) 0.4 MG SL tablet Place 1 tablet (0.4 mg total) under the tongue every 5 (five) minutes x 3 doses as needed for chest pain (if no relief  after 3rd dose, proceed to the ED for an evaluation). 25 tablet 3   rosuvastatin (CRESTOR) 10 MG tablet Take 1 tablet (10 mg total) by mouth daily. 30 tablet 6   sitaGLIPtin (JANUVIA) 100 MG tablet Take 1 tablet (100 mg total) by mouth daily. 90 tablet 0   umeclidinium bromide (INCRUSE ELLIPTA) 62.5 MCG/INH AEPB Inhale 1 puff into the lungs daily. 30 each 2   No current facility-administered medications for this visit.   Allergies:  Other and Percocet [oxycodone-acetaminophen]   Social History: The patient  reports that she has been smoking cigarettes. She has been smoking an average of 1 pack per day. She has never used smokeless tobacco. She reports that she does not drink alcohol and does not use drugs.   Family History: The patient's family history includes Breast cancer in her paternal aunt, paternal aunt, and paternal grandmother; Hypertension in her father.  ROS:  Please see the history of present illness. Otherwise, complete review of systems is positive for none.  All other systems are reviewed and negative.   Physical Exam: VS:  BP (!) 152/76 Comment: has not taken morning med yet  Pulse 78   Ht '5\' 1"'$  (1.549 m)   Wt 166 lb 3.2 oz (75.4 kg)   SpO2 98%   BMI 31.40 kg/m , BMI Body mass index is 31.4 kg/m.  Wt Readings from Last 3 Encounters:  04/11/21 166 lb 3.2 oz (75.4 kg)  03/22/21 165 lb 12.8 oz (75.2 kg)  03/11/21 162 lb 1.9 oz (73.5 kg)    General: Patient appears comfortable at rest. Neck: Supple, no elevated JVP or carotid bruits, no thyromegaly. Lungs: Clear to auscultation, nonlabored breathing at rest. Cardiac: Regular rate and rhythm, no S3 or significant systolic murmur, no pericardial rub. Extremities: No pitting edema, distal pulses 2+. Skin: Warm and dry. Musculoskeletal: No kyphosis. Neuropsychiatric: Alert and oriented x3, affect grossly appropriate.  ECG: 04/11/2021 normal sinus rhythm rate of 77.  No ST or T wave abnormalities or ectopy  noted.  Recent Labwork: 01/24/2021: ALT 18; AST 13; BUN 7; Creatinine, Ser 0.65; Hemoglobin 14.6; Platelets 279; Potassium 4.3; Sodium 139; TSH 1.380     Component Value Date/Time   CHOL 251 (H) 01/24/2021 0937   TRIG 131 01/24/2021 0937   HDL 48 01/24/2021 0937   CHOLHDL 5.2 (H) 01/24/2021 0937   LDLCALC 179 (H) 01/24/2021 0937    Other Studies Reviewed Today:   Cardiac catheterization 05/12/2018: Conclusions: Moderate to severe 3-vessel coronary artery disease, including 50% proximal LAD, 70% mid LCx, and 60% mid RCA stenoses.  FFR of LAD and RCA lesions is borderline positive (FFR 0.80-0.81).  Mid/distal LCx is small and supplies a relatively small territory. Mildly elevated left ventricular filling pressure with normal left ventricular systolic function.  Recommendations: Aggressive medical therapy.  I will add isosorbide mononitrate 30 mg daily.  Tobacco cessation and better control of her diabetes are critical. If patient has persistent symptoms and/or worsening of her coronary artery disease, surgical consultation would need to be considered, given three-vessel disease involving the proximal LAD and the patient's history of diabetes.  However, I am not sure that the distal LCx is graftable, given its relatively small size.   Recommend Aspirin '81mg'$  daily for moderate CAD.   Diagnostic Dominance: Right      Assessment and Plan:  1. CAD in native artery   2. Mixed hyperlipidemia   3. Essential hypertension   4. Chest pain of uncertain etiology   5. Tobacco abuse   6. Uncontrolled type 2 diabetes mellitus with hyperglycemia ([place])   7. Pre-procedure lab exam     1. CAD in native artery. After last visit and discussion with Dr. Domenic Polite.  He is recommending a cardiac catheterization.  We discussed risks and benefits today.  She is agreeable to undergo cardiac catheterization.  She continues to have intermittent chest pain.  Continue nitroglycerin sublingual.  Continue  aspirin 81 mg daily.  Please please schedule for cardiac catheterization.  Cardiac catheterization scheduled for September 19 at 10 AM with Dr. Angelena Form  2. Mixed hyperlipidemia Recent lipid panel showed LDL of 179 on the labs on January 24, 2021.  Please stop pravastatin and start Crestor 10 mg daily.  Get a follow-up FLP and LFT in 8 to 10 weeks after initiating Crestor.    3. Essential hypertension Blood pressure elevated today but patient states she has  not taken any of her antihypertensive medication yet.  Continue amlodipine to 10 mg daily.  Continue bisoprolol 2.5 mg p.o. daily.  Continue lisinopril 10 mg daily.  4.  Chest pain of uncertain etiology Given recent recurring chest pain and stress results.  I conferred with her primary cardiologist who is recommending she have a follow-up cardiac catheterization.  As noted above in #1 we discussed risk and benefits and she agrees to proceed with cardiac catheterization.  5.  Smoking Long history of smoking.  She was just started on Chantix by primary care provider.  6.  Uncontrolled type 2 diabetes. [facility]  Recent hemoglobin A1c was 11.7.  Dr. Posey Pronto started her on glipizide.  She could not tolerate metformin due to side effects with mainly diarrhea.  Also on Januvia 100 mg daily.  Medication Adjustments/Labs and Tests Ordered: Current medicines are reviewed at length with the patient today.  Concerns regarding medicines are outlined above.   Disposition: Follow-up with Dr. Domenic Polite or APP 1 month after cardiac catheterization  Signed, Levell July, NP 04/11/2021 12:34 PM    [facility] [place], [place], [place] [address] Phone: [phone]; Fax: [phone]

## 2021-04-10 NOTE — H&P (View-Only) (Signed)
Cardiology Office Note  Date: 04/11/2021   ID: Beanca, Bequette February 22, 1961, MRN [id number]  PCP:  Lindell Spar, MD  Cardiologist:  Rozann Lesches, MD Electrophysiologist:  None   Chief Complaint: Discuss cardiac catheterization risk and benefits  History of Present Illness: Lauren David is a 61 y.o. female with a history of CAD, COPD, HTN, HLD, DM2.   She was last seen by Dr. Domenic Polite 05/25/2020 for routine visit.  She reported fatigue, back pain and pleuritic chest discomfort with hip pain.  She had not yet established with a PCP.  Her medications were reviewed and she needed refills.  Dr. Domenic Polite agreed to refill her diabetes medications until she found the PCP.  He arranged follow-up lab work. She had moderate multivessel CAD being managed medically.  EKG was normal that day.  She was continuing aspirin, Norvasc, Zebeta, lisinopril and Pravachol.  She also had as needed nitroglycerin available.  He ordered FLP's and LFTs.  Blood pressure control was reasonable that day.  Plans to check a basic metabolic panel.  She was provided with phone numbers for PCPs in the area for her to make contact and establish in the future.  Her labs on 01/24/2021 showed elevated glucose of 272.  BUN of 7, creatinine of 0.65, total cholesterol 251, HDL 48, triglycerides 131, LDL 179, vitamin D 9.1, CBC unremarkable, hemoglobin A1c 11.7 with estimated average glucose of 289, TSH of 1.38  Was last here for 36-monthfollow-up.  She recently [facility] and saw him on March 11, 2021.  He ordered a CMP and lipid panel.  Her diabetes was under controlled.  Recent hemoglobin A1c was 11.7.  Per Dr. PPosey Prontoblood sugar was uncontrolled due to noncompliance.  She was on Januvia 100 mg daily.  She was intolerant of metformin due to diarrhea.  He had started her on glipizide.  She was on ACE inhibitor.  Her blood pressure was elevated at 155/78 during that visit.  Blood pressure  was elevated at 158/72.  She did  not check her blood pressures at home.  She has been having some chest pain and some lower back pain.  She denies any radiation to neck, arm, or jaw.  She denies any nausea, vomiting, or sweating.   She was last here for follow-up of recent stress test.  After speaking with her primary cardiologist he is recommending she undergo a cardiac catheterization given recent increase in chest pain and previous history of CAD on cardiac catheterization October 2019.  She had a cardiac catheterization in October 2019 which demonstrated moderate to severe three-vessel CAD including 50% proximal LAD, 70% mid circumflex, and 60% mid RCA stenosis.  FFR of LAD and RCA lesion [facility]81 mid/distal circumflex was small and supplied a relatively small territory.  Dr. ESaunders Revelmentioned under his recommendation after cardiac catheterization if she had persistent symptoms and/or worsening of her coronary artery disease, surgical consultation would need to be considered given three-vessel disease involving proximal LAD and patient's history of diabetes.  He did state he was not sure of the distal LCx was graft doubled due to its relatively small size.   She is here today to discuss risk and benefits of cardiac catheterization and Dr. MMyles Giprecommendation that she have a follow-up cardiac catheterization based on continuing symptoms and likely changes in anatomy since last cardiac catheterization.  We discussed risks and benefits thoroughly.  She understands and  agrees to proceed.  She continues to have episodes of chest pain.   Past Medical History:  Diagnosis Date   Anxiety    CAD (coronary artery disease)    Multivessel disease 05/2018   Chronic back pain    COPD (chronic obstructive pulmonary disease) (HCC)    Depression    Essential hypertension    Hyperlipidemia    Type 2 diabetes mellitus ([place])     Past Surgical History:  Procedure Laterality Date    BREAST BIOPSY Left    COLONOSCOPY N/A 04/26/2018   Procedure: COLONOSCOPY;  Surgeon: Rogene Houston, MD;  [facility];  Service: Endoscopy;  Laterality: N/A;  1:15   INTRAVASCULAR PRESSURE WIRE/FFR STUDY N/A 05/12/2018   Procedure: INTRAVASCULAR PRESSURE WIRE/FFR STUDY;  Surgeon: Nelva Bush, MD;  Location: [facility];  Service: Cardiovascular;  Laterality: N/A;   LEFT HEART CATH AND CORONARY ANGIOGRAPHY N/A 05/12/2018   Procedure: LEFT HEART CATH AND CORONARY ANGIOGRAPHY;  Surgeon: Nelva Bush, MD;  Location: [facility];  Service: Cardiovascular;  Laterality: N/A;   POLYPECTOMY  04/26/2018   Procedure: POLYPECTOMY;  Surgeon: Rogene Houston, MD;  [facility];  Service: Endoscopy;;  colon    TOTAL VAGINAL HYSTERECTOMY      Current Outpatient Medications  Medication Sig Dispense Refill   albuterol (PROVENTIL) (2.5 MG/3ML) 0.083% nebulizer solution ONE VIAL BY NEBULIZATION EVERY 6 HOURS AS NEEDED FOR WHEEZING OR SHORTNESS OF BREATH. 180 mL 0   albuterol (VENTOLIN HFA) 108 (90 Base) MCG/ACT inhaler INHALE 2 PUFFS INTO THE LUNGS EVERY 4 HOURS AS NEEDED FOR WHEEZING OR SHORTNESS OF BREATH. 17 g 0   amLODipine (NORVASC) 10 MG tablet Take 1 tablet (10 mg total) by mouth daily. 30 tablet 6   aspirin EC 81 MG tablet Take 1 tablet (81 mg total) by mouth daily. 90 tablet 3   bisoprolol (ZEBETA) 5 MG tablet Take 0.5 tablets (2.5 mg total) by mouth daily. 45 tablet 3   dicyclomine (BENTYL) 10 MG capsule Take 1 capsule (10 mg total) by mouth 3 (three) times daily as needed. 60 capsule 1   glipiZIDE (GLUCOTROL) 5 MG tablet Take 1 tablet (5 mg total) by mouth 2 (two) times daily before a meal. 60 tablet 3   lisinopril (ZESTRIL) 10 MG tablet Take 1 tablet (10 mg total) by mouth daily. 90 tablet 0   nitroGLYCERIN (NITROSTAT) 0.4 MG SL tablet Place 1 tablet (0.4 mg total) under the tongue every 5 (five) minutes x 3 doses as needed for chest pain (if no relief  after 3rd dose, proceed to the ED for an evaluation). 25 tablet 3   rosuvastatin (CRESTOR) 10 MG tablet Take 1 tablet (10 mg total) by mouth daily. 30 tablet 6   sitaGLIPtin (JANUVIA) 100 MG tablet Take 1 tablet (100 mg total) by mouth daily. 90 tablet 0   umeclidinium bromide (INCRUSE ELLIPTA) 62.5 MCG/INH AEPB Inhale 1 puff into the lungs daily. 30 each 2   No current facility-administered medications for this visit.   Allergies:  Other and Percocet [oxycodone-acetaminophen]   Social History: The patient  reports that she has been smoking cigarettes. She has been smoking an average of 1 pack per day. She has never used smokeless tobacco. She reports that she does not drink alcohol and does not use drugs.   Family History: The patient's family history includes Breast cancer in her paternal aunt, paternal aunt, and paternal grandmother; Hypertension in her father.  ROS:  Please see the history of present illness. Otherwise, complete review of systems is positive for none.  All other systems are reviewed and negative.   Physical Exam: VS:  BP (!) 152/76 Comment: has not taken morning med yet  Pulse 78   Ht '5\' 1"'$  (1.549 m)   Wt 166 lb 3.2 oz (75.4 kg)   SpO2 98%   BMI 31.40 kg/m , BMI Body mass index is 31.4 kg/m.  Wt Readings from Last 3 Encounters:  04/11/21 166 lb 3.2 oz (75.4 kg)  03/22/21 165 lb 12.8 oz (75.2 kg)  03/11/21 162 lb 1.9 oz (73.5 kg)    General: Patient appears comfortable at rest. Neck: Supple, no elevated JVP or carotid bruits, no thyromegaly. Lungs: Clear to auscultation, nonlabored breathing at rest. Cardiac: Regular rate and rhythm, no S3 or significant systolic murmur, no pericardial rub. Extremities: No pitting edema, distal pulses 2+. Skin: Warm and dry. Musculoskeletal: No kyphosis. Neuropsychiatric: Alert and oriented x3, affect grossly appropriate.  ECG: 04/11/2021 normal sinus rhythm rate of 77.  No ST or T wave abnormalities or ectopy  noted.  Recent Labwork: 01/24/2021: ALT 18; AST 13; BUN 7; Creatinine, Ser 0.65; Hemoglobin 14.6; Platelets 279; Potassium 4.3; Sodium 139; TSH 1.380     Component Value Date/Time   CHOL 251 (H) 01/24/2021 0937   TRIG 131 01/24/2021 0937   HDL 48 01/24/2021 0937   CHOLHDL 5.2 (H) 01/24/2021 0937   LDLCALC 179 (H) 01/24/2021 0937    Other Studies Reviewed Today:   Cardiac catheterization 05/12/2018: Conclusions: Moderate to severe 3-vessel coronary artery disease, including 50% proximal LAD, 70% mid LCx, and 60% mid RCA stenoses.  FFR of LAD and RCA lesions is borderline positive (FFR 0.80-0.81).  Mid/distal LCx is small and supplies a relatively small territory. Mildly elevated left ventricular filling pressure with normal left ventricular systolic function.  Recommendations: Aggressive medical therapy.  I will add isosorbide mononitrate 30 mg daily.  Tobacco cessation and better control of her diabetes are critical. If patient has persistent symptoms and/or worsening of her coronary artery disease, surgical consultation would need to be considered, given three-vessel disease involving the proximal LAD and the patient's history of diabetes.  However, I am not sure that the distal LCx is graftable, given its relatively small size.   Recommend Aspirin '81mg'$  daily for moderate CAD.   Diagnostic Dominance: Right      Assessment and Plan:  1. CAD in native artery   2. Mixed hyperlipidemia   3. Essential hypertension   4. Chest pain of uncertain etiology   5. Tobacco abuse   6. Uncontrolled type 2 diabetes mellitus with hyperglycemia ([place])   7. Pre-procedure lab exam     1. CAD in native artery. After last visit and discussion with Dr. Domenic Polite.  He is recommending a cardiac catheterization.  We discussed risks and benefits today.  She is agreeable to undergo cardiac catheterization.  She continues to have intermittent chest pain.  Continue nitroglycerin sublingual.  Continue  aspirin 81 mg daily.  Please please schedule for cardiac catheterization.  Cardiac catheterization scheduled for September 19 at 10 AM with Dr. Angelena Form  2. Mixed hyperlipidemia Recent lipid panel showed LDL of 179 on the labs on January 24, 2021.  Please stop pravastatin and start Crestor 10 mg daily.  Get a follow-up FLP and LFT in 8 to 10 weeks after initiating Crestor.    3. Essential hypertension Blood pressure elevated today but patient states she has  not taken any of her antihypertensive medication yet.  Continue amlodipine to 10 mg daily.  Continue bisoprolol 2.5 mg p.o. daily.  Continue lisinopril 10 mg daily.  4.  Chest pain of uncertain etiology Given recent recurring chest pain and stress results.  I conferred with her primary cardiologist who is recommending she have a follow-up cardiac catheterization.  As noted above in #1 we discussed risk and benefits and she agrees to proceed with cardiac catheterization.  5.  Smoking Long history of smoking.  She was just started on Chantix by primary care provider.  6.  Uncontrolled type 2 diabetes. [facility]  Recent hemoglobin A1c was 11.7.  Dr. Posey Pronto started her on glipizide.  She could not tolerate metformin due to side effects with mainly diarrhea.  Also on Januvia 100 mg daily.  Medication Adjustments/Labs and Tests Ordered: Current medicines are reviewed at length with the patient today.  Concerns regarding medicines are outlined above.   Disposition: Follow-up with Dr. Domenic Polite or APP 1 month after cardiac catheterization  Signed, Levell July, NP 04/11/2021 12:34 PM    [facility] [place], [place], [place] [address] Phone: [phone]; Fax: [phone]

## 2021-04-11 ENCOUNTER — Ambulatory Visit (INDEPENDENT_AMBULATORY_CARE_PROVIDER_SITE_OTHER): Payer: Medicare Other | Admitting: Family Medicine

## 2021-04-11 ENCOUNTER — Encounter: Payer: Self-pay | Admitting: Family Medicine

## 2021-04-11 ENCOUNTER — Encounter: Payer: Self-pay | Admitting: *Deleted

## 2021-04-11 ENCOUNTER — Telehealth: Payer: Self-pay | Admitting: Family Medicine

## 2021-04-11 VITALS — BP 152/76 | HR 78 | Ht 61.0 in | Wt 166.2 lb

## 2021-04-11 DIAGNOSIS — Z720 Tobacco use: Secondary | ICD-10-CM

## 2021-04-11 DIAGNOSIS — I2510 Atherosclerotic heart disease of native coronary artery without angina pectoris: Secondary | ICD-10-CM | POA: Diagnosis not present

## 2021-04-11 DIAGNOSIS — E782 Mixed hyperlipidemia: Secondary | ICD-10-CM | POA: Diagnosis not present

## 2021-04-11 DIAGNOSIS — Z01812 Encounter for preprocedural laboratory examination: Secondary | ICD-10-CM

## 2021-04-11 DIAGNOSIS — R079 Chest pain, unspecified: Secondary | ICD-10-CM

## 2021-04-11 DIAGNOSIS — E1165 Type 2 diabetes mellitus with hyperglycemia: Secondary | ICD-10-CM

## 2021-04-11 DIAGNOSIS — I10 Essential (primary) hypertension: Secondary | ICD-10-CM

## 2021-04-11 DIAGNOSIS — Z01818 Encounter for other preprocedural examination: Secondary | ICD-10-CM | POA: Diagnosis not present

## 2021-04-11 NOTE — Patient Instructions (Addendum)
Medication Instructions:  Continue all current medications.  Labwork: BMET, CBC - orders given today   Testing/Procedures: Your physician has requested that you have a cardiac catheterization. Cardiac catheterization is used to diagnose and/or treat various heart conditions. Doctors may recommend this procedure for a number of different reasons. The most common reason is to evaluate chest pain. Chest pain can be a symptom of coronary artery disease (CAD), and cardiac catheterization can show whether plaque is narrowing or blocking your heart's arteries. This procedure is also used to evaluate the valves, as well as measure the blood flow and oxygen levels in different parts of your heart. For further information please visit www.cardiosmart.org. Please follow instruction sheet, as given.   Follow-Up: 1 month   Any Other Special Instructions Will Be Listed Below (If Applicable).   If you need a refill on your cardiac medications before your next appointment, please call your pharmacy.  

## 2021-04-11 NOTE — Telephone Encounter (Signed)
PERCERT:  left heart cath - 9/19 - 10:00 - [email or web address]

## 2021-04-15 ENCOUNTER — Telehealth: Payer: Self-pay | Admitting: *Deleted

## 2021-04-15 NOTE — Telephone Encounter (Signed)
Cath instructions indicate cath scheduled 04/22/21, patient on 04/17/21 cath schedule with Dr Ellyn Hack. I spoke with patient and she had planned for cath 04/22/21. Patient is aware that cath has been moved from 04/17/21 to 04/22/21 Dr End (pt reports he did her last cath)-arrive 8:30 AM for 10:30 AM cath. Patient plans to get BMP/CBC tomorrow at [facility].

## 2021-04-16 ENCOUNTER — Other Ambulatory Visit: Payer: Self-pay

## 2021-04-16 ENCOUNTER — Other Ambulatory Visit (HOSPITAL_COMMUNITY)
Admission: RE | Admit: 2021-04-16 | Discharge: 2021-04-16 | Disposition: A | Discharge status: Home / Self Care | Payer: Medicare Other | Source: Ambulatory Visit | Attending: Family Medicine | Admitting: Family Medicine

## 2021-04-16 DIAGNOSIS — Z01812 Encounter for preprocedural laboratory examination: Secondary | ICD-10-CM | POA: Diagnosis present

## 2021-04-16 DIAGNOSIS — R079 Chest pain, unspecified: Secondary | ICD-10-CM | POA: Insufficient documentation

## 2021-04-16 DIAGNOSIS — I10 Essential (primary) hypertension: Secondary | ICD-10-CM | POA: Diagnosis present

## 2021-04-16 LAB — BASIC METABOLIC PANEL
Anion gap: 10 (ref 5–15)
BUN: 9 mg/dL (ref 8–23)
CO2: 28 mmol/L (ref 22–32)
Calcium: 9.3 mg/dL (ref 8.9–10.3)
Chloride: 98 mmol/L (ref 98–111)
Creatinine, Ser: 0.62 mg/dL (ref 0.44–1.00)
GFR, Estimated: >60 mL/min (ref >60)
Glucose, Bld: 334 mg/dL — ABNORMAL HIGH (ref 70–99)
Potassium: 4.5 mmol/L (ref 3.5–5.1)
Sodium: 136 mmol/L (ref 135–145)

## 2021-04-16 LAB — CBC
HCT: 47.2 % — ABNORMAL HIGH (ref 36.0–46.0)
Hemoglobin: 15.6 g/dL — ABNORMAL HIGH (ref 12.0–15.0)
MCH: 29.4 pg (ref 26.0–34.0)
MCHC: 33.1 g/dL (ref 30.0–36.0)
MCV: 89.1 fL (ref 80.0–100.0)
Platelets: 267 10*3/uL (ref 150–400)
RBC: 5.3 MIL/uL — ABNORMAL HIGH (ref 3.87–5.11)
RDW: 12 % (ref 11.5–15.5)
WBC: 10.2 10*3/uL (ref 4.0–10.5)
nRBC: 0 % (ref 0.0–0.2)

## 2021-04-18 NOTE — Telephone Encounter (Signed)
Cardiac catheterization scheduled at [facility] for: Monday April 22, 2021 10:30 AM [facility] Main Entrance [facility]) at: 8:30 AM   No solid food after midnight prior to cath, clear liquids until 5 AM day of procedure.  Medication instructions: Hold: -Glipizide-AM of procedure -Januvia-AM of procedure  Except hold medications morning medications can be taken pre-cath with sips of water including aspirin 81 mg.    Confirmed patient has responsible adult to drive home post procedure and be with patient first 24 hours after arriving home.  Patients are allowed one visitor in the waiting room during the time they are at the hospital for their procedure. Both patient and visitor must wear a mask once they enter the hospital.   Patient reports does not currently have any symptoms concerning for COVID-19 and no household members with COVID-19 like illness.      04/16/21-[id number]-patient reports she had not taken medications and was not fasting when lab was done.

## 2021-04-22 ENCOUNTER — Ambulatory Visit (HOSPITAL_COMMUNITY)
Admission: RE | Admit: 2021-04-22 | Discharge: 2021-04-22 | Disposition: A | Discharge status: Home / Self Care | Payer: Medicare Other | Source: Ambulatory Visit | Attending: Internal Medicine | Admitting: Internal Medicine

## 2021-04-22 ENCOUNTER — Encounter (HOSPITAL_COMMUNITY)
Admission: RE | Disposition: A | Discharge status: Home / Self Care | Payer: Self-pay | Source: Ambulatory Visit | Attending: Internal Medicine

## 2021-04-22 DIAGNOSIS — Z8249 Family history of ischemic heart disease and other diseases of the circulatory system: Secondary | ICD-10-CM | POA: Diagnosis not present

## 2021-04-22 DIAGNOSIS — Z885 Allergy status to narcotic agent status: Secondary | ICD-10-CM | POA: Insufficient documentation

## 2021-04-22 DIAGNOSIS — Z79899 Other long term (current) drug therapy: Secondary | ICD-10-CM | POA: Insufficient documentation

## 2021-04-22 DIAGNOSIS — Z7982 Long term (current) use of aspirin: Secondary | ICD-10-CM | POA: Diagnosis not present

## 2021-04-22 DIAGNOSIS — I10 Essential (primary) hypertension: Secondary | ICD-10-CM | POA: Diagnosis not present

## 2021-04-22 DIAGNOSIS — J449 Chronic obstructive pulmonary disease, unspecified: Secondary | ICD-10-CM | POA: Insufficient documentation

## 2021-04-22 DIAGNOSIS — Z7984 Long term (current) use of oral hypoglycemic drugs: Secondary | ICD-10-CM | POA: Insufficient documentation

## 2021-04-22 DIAGNOSIS — I25110 Atherosclerotic heart disease of native coronary artery with unstable angina pectoris: Secondary | ICD-10-CM | POA: Insufficient documentation

## 2021-04-22 DIAGNOSIS — E782 Mixed hyperlipidemia: Secondary | ICD-10-CM | POA: Insufficient documentation

## 2021-04-22 DIAGNOSIS — F17210 Nicotine dependence, cigarettes, uncomplicated: Secondary | ICD-10-CM | POA: Insufficient documentation

## 2021-04-22 DIAGNOSIS — E1165 Type 2 diabetes mellitus with hyperglycemia: Secondary | ICD-10-CM | POA: Insufficient documentation

## 2021-04-22 DIAGNOSIS — I200 Unstable angina: Secondary | ICD-10-CM | POA: Diagnosis present

## 2021-04-22 HISTORY — PX: INTRAVASCULAR PRESSURE WIRE/FFR STUDY: CATH118243

## 2021-04-22 HISTORY — PX: LEFT HEART CATH AND CORONARY ANGIOGRAPHY: CATH118249

## 2021-04-22 LAB — GLUCOSE, CAPILLARY: Glucose-Capillary: 323 mg/dL — ABNORMAL HIGH (ref 70–99)

## 2021-04-22 LAB — POCT ACTIVATED CLOTTING TIME: Activated Clotting Time: 260 seconds

## 2021-04-22 SURGERY — LEFT HEART CATH AND CORONARY ANGIOGRAPHY
Anesthesia: LOCAL

## 2021-04-22 MED ORDER — LABETALOL HCL 5 MG/ML IV SOLN: 10.0000 mg | INTRAVENOUS | Status: DC | PRN

## 2021-04-22 MED ORDER — SODIUM CHLORIDE 0.9 % WEIGHT BASED INFUSION
1.0000 mL/kg/h | INTRAVENOUS | Status: DC
Start: 2021-04-22 — End: 2021-04-22

## 2021-04-22 MED ORDER — MIDAZOLAM HCL 2 MG/2ML IJ SOLN
INTRAMUSCULAR | Status: AC
  Filled 2021-04-22: qty 2

## 2021-04-22 MED ORDER — IOHEXOL 350 MG/ML SOLN
INTRAVENOUS | Status: DC | PRN
  Administered 2021-04-22: 60 mL via INTRA_ARTERIAL

## 2021-04-22 MED ORDER — ACETAMINOPHEN 325 MG PO TABS
650.0000 mg | ORAL_TABLET | ORAL | Status: DC | PRN
  Administered 2021-04-22: 650 mg via ORAL
  Filled 2021-04-22: qty 2

## 2021-04-22 MED ORDER — FENTANYL CITRATE (PF) 100 MCG/2ML IJ SOLN
INTRAMUSCULAR | Status: DC | PRN
  Administered 2021-04-22: 25 ug via INTRAVENOUS

## 2021-04-22 MED ORDER — HEPARIN (PORCINE) IN NACL 1000-0.9 UT/500ML-% IV SOLN
INTRAVENOUS | Status: AC
  Filled 2021-04-22: qty 1000

## 2021-04-22 MED ORDER — ASPIRIN 81 MG PO CHEW: 81.0000 mg | CHEWABLE_TABLET | ORAL | Status: DC

## 2021-04-22 MED ORDER — SODIUM CHLORIDE 0.9 % IV SOLN: INTRAVENOUS | Status: DC

## 2021-04-22 MED ORDER — VERAPAMIL HCL 2.5 MG/ML IV SOLN
INTRAVENOUS | Status: AC
  Filled 2021-04-22: qty 2

## 2021-04-22 MED ORDER — LIDOCAINE HCL (PF) 1 % IJ SOLN
INTRAMUSCULAR | Status: AC
  Filled 2021-04-22: qty 30

## 2021-04-22 MED ORDER — HEPARIN SODIUM (PORCINE) 1000 UNIT/ML IJ SOLN
INTRAMUSCULAR | Status: AC
  Filled 2021-04-22: qty 1

## 2021-04-22 MED ORDER — ONDANSETRON HCL 4 MG/2ML IJ SOLN: 4.0000 mg | Freq: Four times a day (QID) | INTRAMUSCULAR | Status: DC | PRN

## 2021-04-22 MED ORDER — VERAPAMIL HCL 2.5 MG/ML IV SOLN
INTRAVENOUS | Status: DC | PRN
  Administered 2021-04-22: 10 mL via INTRA_ARTERIAL

## 2021-04-22 MED ORDER — SODIUM CHLORIDE 0.9% FLUSH: 3.0000 mL | Freq: Two times a day (BID) | INTRAVENOUS | Status: DC

## 2021-04-22 MED ORDER — LIDOCAINE HCL (PF) 1 % IJ SOLN
INTRAMUSCULAR | Status: DC | PRN
  Administered 2021-04-22: 2 mL via INTRADERMAL

## 2021-04-22 MED ORDER — NITROGLYCERIN 1 MG/10 ML FOR IR/CATH LAB
INTRA_ARTERIAL | Status: AC
  Filled 2021-04-22: qty 10

## 2021-04-22 MED ORDER — SODIUM CHLORIDE 0.9 % WEIGHT BASED INFUSION
3.0000 mL/kg/h | INTRAVENOUS | Status: AC
  Administered 2021-04-22: 3 mL/kg/h via INTRAVENOUS

## 2021-04-22 MED ORDER — MIDAZOLAM HCL 2 MG/2ML IJ SOLN
INTRAMUSCULAR | Status: DC | PRN
  Administered 2021-04-22: 1 mg via INTRAVENOUS

## 2021-04-22 MED ORDER — SODIUM CHLORIDE 0.9% FLUSH: 3.0000 mL | INTRAVENOUS | Status: DC | PRN

## 2021-04-22 MED ORDER — HYDRALAZINE HCL 20 MG/ML IJ SOLN: 10.0000 mg | INTRAMUSCULAR | Status: DC | PRN

## 2021-04-22 MED ORDER — HEPARIN SODIUM (PORCINE) 1000 UNIT/ML IJ SOLN
INTRAMUSCULAR | Status: DC | PRN
  Administered 2021-04-22: 3500 [IU] via INTRAVENOUS
  Administered 2021-04-22: 4000 [IU] via INTRAVENOUS

## 2021-04-22 MED ORDER — SODIUM CHLORIDE 0.9 % IV SOLN: 250.0000 mL | INTRAVENOUS | Status: DC | PRN

## 2021-04-22 MED ORDER — NITROGLYCERIN 1 MG/10 ML FOR IR/CATH LAB
INTRA_ARTERIAL | Status: DC | PRN
  Administered 2021-04-22: 150 ug via INTRACORONARY

## 2021-04-22 MED ORDER — FENTANYL CITRATE (PF) 100 MCG/2ML IJ SOLN
INTRAMUSCULAR | Status: AC
  Filled 2021-04-22: qty 2

## 2021-04-22 MED ORDER — HEPARIN (PORCINE) IN NACL 1000-0.9 UT/500ML-% IV SOLN
INTRAVENOUS | Status: DC | PRN
  Administered 2021-04-22 (×2): 500 mL

## 2021-04-22 SURGICAL SUPPLY — 12 items
CATH 5FR JL3.5 JR4 ANG PIG MP (CATHETERS) ×1 IMPLANT
CATH LAUNCHER 6FR EBU3.5 (CATHETERS) ×1 IMPLANT
DEVICE RAD COMP TR BAND LRG (VASCULAR PRODUCTS) ×1 IMPLANT
GLIDESHEATH SLEND SS 6F .021 (SHEATH) ×1 IMPLANT
GUIDEWIRE INQWIRE 1.5J.035X260 (WIRE) IMPLANT
GUIDEWIRE PRESSURE X 175 (WIRE) ×1 IMPLANT
INQWIRE 1.5J .035X260CM (WIRE) ×2
KIT ESSENTIALS PG (KITS) ×1 IMPLANT
KIT HEART LEFT (KITS) ×2 IMPLANT
PACK CARDIAC CATHETERIZATION (CUSTOM PROCEDURE TRAY) ×2 IMPLANT
TRANSDUCER W/STOPCOCK (MISCELLANEOUS) ×2 IMPLANT
TUBING CIL FLEX 10 FLL-RA (TUBING) ×2 IMPLANT

## 2021-04-22 NOTE — Brief Op Note (Signed)
BRIEF CARDIAC CATHETERIZATION NOTE  04/22/2021  11:16 AM  PATIENT:  Lauren David  60 y.o. female  PRE-OPERATIVE DIAGNOSIS:  Accelerating angina  POST-OPERATIVE DIAGNOSIS:  Same  PROCEDURE:  Procedure(s): LEFT HEART CATH AND CORONARY ANGIOGRAPHY (N/A) INTRAVASCULAR PRESSURE WIRE/FFR STUDY (N/A)  SURGEON:  Surgeon(s) and Role:    * Meliza Kage, MD - Primary  FINDINGS: Significant three-vessel CAD that has progressed since 2019, inlcluding long segment of mid LAD disease of up to 75% (RFR = 0.83), 60% proximal LCx stenosis followed by sequential 30% and 90% OM1 lesions, and 70% mid RCA disease. Normal LVEF and LVEDP.  RECOMMENDATIONS: Outpatient cardiac surgery consultation for CABG. Aggressive secondary prevention. Obtain TTE prior to discharge home today in anticipation of cardiac surgery consultation.  Nelva Bush, MD [facility]

## 2021-04-22 NOTE — Progress Notes (Signed)
TCTS consulted for outpatient CABG evaluation. TCTS office will call the patient with an appointment. 

## 2021-04-22 NOTE — Progress Notes (Signed)
Patient scheduled to have ECHO completed prior to D/C. ECHO department called and they stated that they are short staffed and backed up and they don't think they will be able to do it today. End, MD notified and stated that he would try to schedule it as outpatient. Patient and family notified.

## 2021-04-22 NOTE — Interval H&P Note (Signed)
History and Physical Interval Note:  04/22/2021 10:11 AM  Lauren David  has presented today for surgery, with the diagnosis of accelerating angina and abnormal stress test.  The various methods of treatment have been discussed with the patient and family. After consideration of risks, benefits and other options for treatment, the patient has consented to  Procedure(s): LEFT HEART CATH AND CORONARY ANGIOGRAPHY (N/A) as a surgical intervention.  The patient's history has been reviewed, patient examined, no change in status, stable for surgery.  I have reviewed the patient's chart and labs.  Questions were answered to the patient's satisfaction.    Cath Lab Visit (complete for each Cath Lab visit)  Clinical Evaluation Leading to the Procedure:   ACS: No.  Non-ACS:    Anginal Classification: CCS IV  Anti-ischemic medical therapy: Maximal Therapy (2 or more classes of medications)  Non-Invasive Test Results: Low-risk stress test findings: cardiac mortality <1%/year  Prior CABG: No previous CABG  Matej Sappenfield

## 2021-04-23 ENCOUNTER — Other Ambulatory Visit: Payer: Self-pay | Admitting: *Deleted

## 2021-04-23 ENCOUNTER — Encounter (HOSPITAL_COMMUNITY): Payer: Self-pay | Admitting: Internal Medicine

## 2021-04-23 DIAGNOSIS — R9439 Abnormal result of other cardiovascular function study: Secondary | ICD-10-CM

## 2021-04-23 DIAGNOSIS — Z01818 Encounter for other preprocedural examination: Secondary | ICD-10-CM

## 2021-04-23 DIAGNOSIS — I52 Other heart disorders in diseases classified elsewhere: Secondary | ICD-10-CM

## 2021-04-23 NOTE — Progress Notes (Signed)
Patient notified of need to schedule Echo.  Order entered into epic & pcc notified to get scheduled for her.

## 2021-04-25 ENCOUNTER — Ambulatory Visit (HOSPITAL_COMMUNITY)
Admission: RE | Admit: 2021-04-25 | Discharge: 2021-04-25 | Disposition: A | Discharge status: Home / Self Care | Payer: Medicare Other | Source: Ambulatory Visit | Attending: Family Medicine | Admitting: Family Medicine

## 2021-04-25 ENCOUNTER — Other Ambulatory Visit: Payer: Self-pay

## 2021-04-25 DIAGNOSIS — Z01818 Encounter for other preprocedural examination: Secondary | ICD-10-CM | POA: Insufficient documentation

## 2021-04-25 DIAGNOSIS — Z01810 Encounter for preprocedural cardiovascular examination: Secondary | ICD-10-CM | POA: Diagnosis not present

## 2021-04-25 DIAGNOSIS — I52 Other heart disorders in diseases classified elsewhere: Secondary | ICD-10-CM | POA: Insufficient documentation

## 2021-04-25 DIAGNOSIS — R9439 Abnormal result of other cardiovascular function study: Secondary | ICD-10-CM | POA: Diagnosis present

## 2021-04-25 LAB — ECHOCARDIOGRAM COMPLETE
AR max vel: 1.65 cm2
AV Area VTI: 2 cm2
AV Area mean vel: 1.41 cm2
AV Mean grad: 4.4 mmHg
AV Peak grad: 8.9 mmHg
Ao pk vel: 1.49 m/s
Area-P 1/2: 4.06 cm2
S' Lateral: 2.4 cm

## 2021-04-25 NOTE — Progress Notes (Signed)
*  PRELIMINARY RESULTS* Echocardiogram 2D Echocardiogram has been performed.  Lauren David 04/25/2021, 9:07 AM

## 2021-04-26 ENCOUNTER — Telehealth: Payer: Self-pay | Admitting: *Deleted

## 2021-04-26 ENCOUNTER — Other Ambulatory Visit: Payer: Self-pay | Admitting: Internal Medicine

## 2021-04-26 DIAGNOSIS — J449 Chronic obstructive pulmonary disease, unspecified: Secondary | ICD-10-CM

## 2021-04-26 NOTE — Telephone Encounter (Signed)
-----   Message from Verta Ellen., NP sent at 04/25/2021 11:08 AM EDT ----- Please call the patient and let her know the echocardiogram showed she has good pumping function of the heart.  The main pumping chamber has a problem with relaxation when trying to fill with blood.  Otherwise no other significant issues noted on echocardiogram.  Thank you  Verta Ellen, NP  04/25/2021 11:03 AM

## 2021-04-26 NOTE — Telephone Encounter (Signed)
Laurine Blazer, LPN  4/49/2010  0:71 AM EDT Back to Top    Notified, copy to pcp.

## 2021-05-03 ENCOUNTER — Institutional Professional Consult (permissible substitution) (INDEPENDENT_AMBULATORY_CARE_PROVIDER_SITE_OTHER): Payer: Medicare Other | Admitting: Thoracic Surgery (Cardiothoracic Vascular Surgery)

## 2021-05-03 ENCOUNTER — Other Ambulatory Visit: Payer: Self-pay | Admitting: *Deleted

## 2021-05-03 ENCOUNTER — Encounter: Payer: Self-pay | Admitting: *Deleted

## 2021-05-03 ENCOUNTER — Ambulatory Visit: Payer: Medicare Other | Admitting: Family Medicine

## 2021-05-03 ENCOUNTER — Encounter: Payer: Self-pay | Admitting: Thoracic Surgery (Cardiothoracic Vascular Surgery)

## 2021-05-03 ENCOUNTER — Other Ambulatory Visit: Payer: Self-pay

## 2021-05-03 VITALS — BP 149/75 | HR 83 | Resp 18 | Ht 61.0 in | Wt 168.0 lb

## 2021-05-03 DIAGNOSIS — I2510 Atherosclerotic heart disease of native coronary artery without angina pectoris: Secondary | ICD-10-CM

## 2021-05-03 DIAGNOSIS — E119 Type 2 diabetes mellitus without complications: Secondary | ICD-10-CM

## 2021-05-03 DIAGNOSIS — R9439 Abnormal result of other cardiovascular function study: Secondary | ICD-10-CM

## 2021-05-03 DIAGNOSIS — E1165 Type 2 diabetes mellitus with hyperglycemia: Secondary | ICD-10-CM

## 2021-05-03 MED ORDER — VARENICLINE TARTRATE 0.5 MG PO TABS: 0.5000 mg | ORAL_TABLET | Freq: Two times a day (BID) | ORAL | 0 refills | Status: DC

## 2021-05-03 NOTE — H&P (View-Only) (Signed)
[address]       [place] [address]             [phone]        Lauren David [place] Medical Record #[id number] Date of Birth: March 02, 1961  Referring: Nelva Bush, MD Primary Care: Lindell Spar, MD Primary Cardiologist:Samuel Domenic Polite, MD  Chief Complaint:    Chief Complaint  Patient presents with   Coronary Artery Disease    Initial surgical consult, Cath 9/19    History of Present Illness:     60 year old female presents for surgical evaluation of three-vessel coronary artery disease.  She has a history of diabetes, and continues to smoke about a pack and a half of cigarettes a day.  She has been having some exertional anginal symptoms as well as some symptoms at rest.  She underwent an elective left heart cath which identified three-vessel coronary disease.   Past Medical and Surgical History: Previous Chest Surgery: No Previous Chest Radiation: No Diabetes Mellitus: Yes.  HbA1C 11.7 Creatinine: 0.62  Past Medical History:  Diagnosis Date   Anxiety    CAD (coronary artery disease)    Multivessel disease 05/2018   Chronic back pain    COPD (chronic obstructive pulmonary disease) (HCC)    Depression    Essential hypertension    Hyperlipidemia    Type 2 diabetes mellitus ([place])     Past Surgical History:  Procedure Laterality Date   BREAST BIOPSY Left    COLONOSCOPY N/A 04/26/2018   Procedure: COLONOSCOPY;  Surgeon: Rogene Houston, MD;  [facility];  Service: Endoscopy;  Laterality: N/A;  1:15   INTRAVASCULAR PRESSURE WIRE/FFR STUDY N/A 05/12/2018   Procedure: INTRAVASCULAR PRESSURE WIRE/FFR STUDY;  Surgeon: Nelva Bush, MD;  Location: [place] [facility];  Service: Cardiovascular;  Laterality: N/A;   INTRAVASCULAR PRESSURE WIRE/FFR STUDY N/A 04/22/2021   Procedure: INTRAVASCULAR PRESSURE WIRE/FFR STUDY;  Surgeon: Nelva Bush, MD;  Location: [place] [facility];  Service: Cardiovascular;  Laterality: N/A;    LEFT HEART CATH AND CORONARY ANGIOGRAPHY N/A 05/12/2018   Procedure: LEFT HEART CATH AND CORONARY ANGIOGRAPHY;  Surgeon: Nelva Bush, MD;  Location: [place] [facility];  Service: Cardiovascular;  Laterality: N/A;   LEFT HEART CATH AND CORONARY ANGIOGRAPHY N/A 04/22/2021   Procedure: LEFT HEART CATH AND CORONARY ANGIOGRAPHY;  Surgeon: Nelva Bush, MD;  Location: [place] [facility];  Service: Cardiovascular;  Laterality: N/A;   POLYPECTOMY  04/26/2018   Procedure: POLYPECTOMY;  Surgeon: Rogene Houston, MD;  [facility];  Service: Endoscopy;;  colon    TOTAL VAGINAL HYSTERECTOMY      Social History: Support: Lives with her adult grandchildren  Social History   Tobacco Use  Smoking Status Every Day   Packs/day: 1.00   Types: Cigarettes  Smokeless Tobacco Never  Tobacco Comments   cut back to 1 pack a day. Smokig x 68yrs. Did smoke 2 packs x 5 yrs, but cut back a month ago.     Social History   Substance and Sexual Activity  Alcohol Use Never     Allergies  Allergen Reactions   Percocet [Oxycodone-Acetaminophen] Rash     Current Outpatient Medications  Medication Sig Dispense Refill   acetaminophen (TYLENOL) 500 MG tablet Take 1,000 mg by mouth every 6 (six) hours as needed for moderate pain or headache.     albuterol (PROVENTIL) (2.5 MG/3ML) 0.083% nebulizer solution ONE VIAL BY NEBULIZATION EVERY  6 HOURS AS NEEDED FOR WHEEZING OR SHORTNESS OF BREATH. 180 mL 0   albuterol (VENTOLIN HFA) 108 (90 Base) MCG/ACT inhaler INHALE 2 PUFFS INTO THE LUNGS EVERY 4 HOURS AS NEEDED FOR WHEEZING OR SHORTNESS OF BREATH. 17 g 0   amLODipine (NORVASC) 10 MG tablet Take 1 tablet (10 mg total) by mouth daily. 30 tablet 6   aspirin EC 81 MG tablet Take 1 tablet (81 mg total) by mouth daily. 90 tablet 3   bisoprolol (ZEBETA) 5 MG tablet Take 0.5 tablets (2.5 mg total) by mouth daily. 45 tablet 3   dicyclomine (BENTYL) 10 MG capsule Take 1 capsule (10 mg total) by mouth 3  (three) times daily as needed. 60 capsule 1   glipiZIDE (GLUCOTROL) 5 MG tablet Take 1 tablet (5 mg total) by mouth 2 (two) times daily before a meal. 60 tablet 3   ibuprofen (ADVIL) 200 MG tablet Take 400-800 mg by mouth every 6 (six) hours as needed for headache or moderate pain.     lisinopril (ZESTRIL) 10 MG tablet Take 1 tablet (10 mg total) by mouth daily. 90 tablet 0   nitroGLYCERIN (NITROSTAT) 0.4 MG SL tablet Place 1 tablet (0.4 mg total) under the tongue every 5 (five) minutes x 3 doses as needed for chest pain (if no relief after 3rd dose, proceed to the ED for an evaluation). 25 tablet 3   rosuvastatin (CRESTOR) 10 MG tablet Take 1 tablet (10 mg total) by mouth daily. 30 tablet 6   sitaGLIPtin (JANUVIA) 100 MG tablet Take 1 tablet (100 mg total) by mouth daily. 90 tablet 0   umeclidinium bromide (INCRUSE ELLIPTA) 62.5 MCG/INH AEPB Inhale 1 puff into the lungs daily. 30 each 2   varenicline (CHANTIX) 0.5 MG tablet Take 1 tablet (0.5 mg total) by mouth 2 (two) times daily. 60 tablet 0   No current facility-administered medications for this visit.    (Not in a hospital admission)   Family History  Problem Relation Age of Onset   Hypertension Father    Breast cancer Paternal Aunt    Breast cancer Paternal Grandmother    Breast cancer Paternal Aunt      Review of Systems:   Review of Systems  Constitutional:  Positive for malaise/fatigue.  Respiratory:  Positive for shortness of breath.   Cardiovascular:  Positive for chest pain. Negative for leg swelling.     Physical Exam: BP (!) 149/75 (BP Location: Right Arm, Patient Position: Sitting)   Pulse 83   Resp 18   Ht 5\' 1"  (1.549 m)   Wt 168 lb (76.2 kg)   SpO2 97% Comment: RA  BMI 31.74 kg/m  Physical Exam Constitutional:      General: She is not in acute distress.    Appearance: Normal appearance. She is not ill-appearing.  Cardiovascular:     Rate and Rhythm: Normal rate.     Heart sounds: No murmur  heard. Abdominal:     General: There is no distension.  Musculoskeletal:     Cervical back: Normal range of motion.  Skin:    General: Skin is warm and dry.  Neurological:     General: No focal deficit present.     Mental Status: She is alert and oriented to person, place, and time.      Diagnostic Studies & Laboratory data:    Left Heart Catherization: Conclusion: Significant three-vessel CAD that has progressed since 2019, inlcluding long segment of mid LAD disease of up to 75% (  RFR = 0.83), 60% proximal LCx stenosis followed by sequential 30% and 90% OM1 lesions, and 70% mid RCA disease. Normal left ventricular systolic function and filling pressure. Echo: IMPRESSIONS     1. Left ventricular ejection fraction, by estimation, is 60 to 65%. The  left ventricle has normal function. The left ventricle has no regional  wall motion abnormalities. Left ventricular diastolic parameters are  consistent with Grade I diastolic  dysfunction (impaired relaxation).   2. Right ventricular systolic function is normal. The right ventricular  size is normal.   3. The mitral valve is normal in structure. No evidence of mitral valve  regurgitation. No evidence of mitral stenosis.   4. The aortic valve is tricuspid. Aortic valve regurgitation is not  visualized. No aortic stenosis is present.   5. The inferior vena cava is normal in size with greater than 50%  respiratory variability, suggesting right atrial pressure of 3 mmHg.   EKG: Sinus  I have independently reviewed the above radiologic studies and discussed with the patient   Recent Lab Findings: Lab Results  Component Value Date   WBC 10.2 04/16/2021   HGB 15.6 (H) 04/16/2021   HCT 47.2 (H) 04/16/2021   PLT 267 04/16/2021   GLUCOSE 334 (H) 04/16/2021   CHOL 251 (H) 01/24/2021   TRIG 131 01/24/2021   HDL 48 01/24/2021   LDLCALC 179 (H) 01/24/2021   ALT 18 01/24/2021   AST 13 01/24/2021   NA 136 04/16/2021   K 4.5  04/16/2021   CL 98 04/16/2021   CREATININE 0.62 04/16/2021   BUN 9 04/16/2021   CO2 28 04/16/2021   TSH 1.380 01/24/2021   HGBA1C 11.7 (H) 01/24/2021      Assessment / Plan:   60 year old female with severe three-vessel coronary artery disease.  She also has poorly controlled diabetes.  She continues to smoke a pack and a half of cigarettes daily.  I given her prescription for Chantix to assist with smoking cessation.  She is agreeable to proceed with surgical revascularization.  She will require bypass to the LAD, PDA, and to obtuse marginals.  She is right-handed thus we will harvest the left radial artery.  She is tentatively scheduled in mid October.     I  spent 40 minutes counseling the patient face to face.   Lajuana Matte 05/03/2021 6:54 PM

## 2021-05-03 NOTE — Progress Notes (Signed)
[address]       [place] [address]             [phone]        Jashley C Ring[place] Medical Record #[id number] Date of Birth: 25-Jun-1961  Referring: Nelva Bush, MD Primary Care: Lindell Spar, MD Primary Cardiologist:Samuel Domenic Polite, MD  Chief Complaint:    Chief Complaint  Patient presents with   Coronary Artery Disease    Initial surgical consult, Cath 9/19    History of Present Illness:     60 year old female presents for surgical evaluation of three-vessel coronary artery disease.  She has a history of diabetes, and continues to smoke about a pack and a half of cigarettes a day.  She has been having some exertional anginal symptoms as well as some symptoms at rest.  She underwent an elective left heart cath which identified three-vessel coronary disease.   Past Medical and Surgical History: Previous Chest Surgery: No Previous Chest Radiation: No Diabetes Mellitus: Yes.  HbA1C 11.7 Creatinine: 0.62  Past Medical History:  Diagnosis Date   Anxiety    CAD (coronary artery disease)    Multivessel disease 05/2018   Chronic back pain    COPD (chronic obstructive pulmonary disease) (HCC)    Depression    Essential hypertension    Hyperlipidemia    Type 2 diabetes mellitus ([place])     Past Surgical History:  Procedure Laterality Date   BREAST BIOPSY Left    COLONOSCOPY N/A 04/26/2018   Procedure: COLONOSCOPY;  Surgeon: Rogene Houston, MD;  [facility];  Service: Endoscopy;  Laterality: N/A;  1:15   INTRAVASCULAR PRESSURE WIRE/FFR STUDY N/A 05/12/2018   Procedure: INTRAVASCULAR PRESSURE WIRE/FFR STUDY;  Surgeon: Nelva Bush, MD;  Location: [place] [facility];  Service: Cardiovascular;  Laterality: N/A;   INTRAVASCULAR PRESSURE WIRE/FFR STUDY N/A 04/22/2021   Procedure: INTRAVASCULAR PRESSURE WIRE/FFR STUDY;  Surgeon: Nelva Bush, MD;  Location: [place] [facility];  Service: Cardiovascular;  Laterality: N/A;    LEFT HEART CATH AND CORONARY ANGIOGRAPHY N/A 05/12/2018   Procedure: LEFT HEART CATH AND CORONARY ANGIOGRAPHY;  Surgeon: Nelva Bush, MD;  Location: [place] [facility];  Service: Cardiovascular;  Laterality: N/A;   LEFT HEART CATH AND CORONARY ANGIOGRAPHY N/A 04/22/2021   Procedure: LEFT HEART CATH AND CORONARY ANGIOGRAPHY;  Surgeon: Nelva Bush, MD;  Location: [place] [facility];  Service: Cardiovascular;  Laterality: N/A;   POLYPECTOMY  04/26/2018   Procedure: POLYPECTOMY;  Surgeon: Rogene Houston, MD;  [facility];  Service: Endoscopy;;  colon    TOTAL VAGINAL HYSTERECTOMY      Social History: Support: Lives with her adult grandchildren  Social History   Tobacco Use  Smoking Status Every Day   Packs/day: 1.00   Types: Cigarettes  Smokeless Tobacco Never  Tobacco Comments   cut back to 1 pack a day. Smokig x 71yrs. Did smoke 2 packs x 5 yrs, but cut back a month ago.     Social History   Substance and Sexual Activity  Alcohol Use Never     Allergies  Allergen Reactions   Percocet [Oxycodone-Acetaminophen] Rash     Current Outpatient Medications  Medication Sig Dispense Refill   acetaminophen (TYLENOL) 500 MG tablet Take 1,000 mg by mouth every 6 (six) hours as needed for moderate pain or headache.     albuterol (PROVENTIL) (2.5 MG/3ML) 0.083% nebulizer solution ONE VIAL BY NEBULIZATION EVERY  6 HOURS AS NEEDED FOR WHEEZING OR SHORTNESS OF BREATH. 180 mL 0   albuterol (VENTOLIN HFA) 108 (90 Base) MCG/ACT inhaler INHALE 2 PUFFS INTO THE LUNGS EVERY 4 HOURS AS NEEDED FOR WHEEZING OR SHORTNESS OF BREATH. 17 g 0   amLODipine (NORVASC) 10 MG tablet Take 1 tablet (10 mg total) by mouth daily. 30 tablet 6   aspirin EC 81 MG tablet Take 1 tablet (81 mg total) by mouth daily. 90 tablet 3   bisoprolol (ZEBETA) 5 MG tablet Take 0.5 tablets (2.5 mg total) by mouth daily. 45 tablet 3   dicyclomine (BENTYL) 10 MG capsule Take 1 capsule (10 mg total) by mouth 3  (three) times daily as needed. 60 capsule 1   glipiZIDE (GLUCOTROL) 5 MG tablet Take 1 tablet (5 mg total) by mouth 2 (two) times daily before a meal. 60 tablet 3   ibuprofen (ADVIL) 200 MG tablet Take 400-800 mg by mouth every 6 (six) hours as needed for headache or moderate pain.     lisinopril (ZESTRIL) 10 MG tablet Take 1 tablet (10 mg total) by mouth daily. 90 tablet 0   nitroGLYCERIN (NITROSTAT) 0.4 MG SL tablet Place 1 tablet (0.4 mg total) under the tongue every 5 (five) minutes x 3 doses as needed for chest pain (if no relief after 3rd dose, proceed to the ED for an evaluation). 25 tablet 3   rosuvastatin (CRESTOR) 10 MG tablet Take 1 tablet (10 mg total) by mouth daily. 30 tablet 6   sitaGLIPtin (JANUVIA) 100 MG tablet Take 1 tablet (100 mg total) by mouth daily. 90 tablet 0   umeclidinium bromide (INCRUSE ELLIPTA) 62.5 MCG/INH AEPB Inhale 1 puff into the lungs daily. 30 each 2   varenicline (CHANTIX) 0.5 MG tablet Take 1 tablet (0.5 mg total) by mouth 2 (two) times daily. 60 tablet 0   No current facility-administered medications for this visit.    (Not in a hospital admission)   Family History  Problem Relation Age of Onset   Hypertension Father    Breast cancer Paternal Aunt    Breast cancer Paternal Grandmother    Breast cancer Paternal Aunt      Review of Systems:   Review of Systems  Constitutional:  Positive for malaise/fatigue.  Respiratory:  Positive for shortness of breath.   Cardiovascular:  Positive for chest pain. Negative for leg swelling.     Physical Exam: BP (!) 149/75 (BP Location: Right Arm, Patient Position: Sitting)   Pulse 83   Resp 18   Ht 5\' 1"  (1.549 m)   Wt 168 lb (76.2 kg)   SpO2 97% Comment: RA  BMI 31.74 kg/m  Physical Exam Constitutional:      General: She is not in acute distress.    Appearance: Normal appearance. She is not ill-appearing.  Cardiovascular:     Rate and Rhythm: Normal rate.     Heart sounds: No murmur  heard. Abdominal:     General: There is no distension.  Musculoskeletal:     Cervical back: Normal range of motion.  Skin:    General: Skin is warm and dry.  Neurological:     General: No focal deficit present.     Mental Status: She is alert and oriented to person, place, and time.      Diagnostic Studies & Laboratory data:    Left Heart Catherization: Conclusion: Significant three-vessel CAD that has progressed since 2019, inlcluding long segment of mid LAD disease of up to 75% (  RFR = 0.83), 60% proximal LCx stenosis followed by sequential 30% and 90% OM1 lesions, and 70% mid RCA disease. Normal left ventricular systolic function and filling pressure. Echo: IMPRESSIONS     1. Left ventricular ejection fraction, by estimation, is 60 to 65%. The  left ventricle has normal function. The left ventricle has no regional  wall motion abnormalities. Left ventricular diastolic parameters are  consistent with Grade I diastolic  dysfunction (impaired relaxation).   2. Right ventricular systolic function is normal. The right ventricular  size is normal.   3. The mitral valve is normal in structure. No evidence of mitral valve  regurgitation. No evidence of mitral stenosis.   4. The aortic valve is tricuspid. Aortic valve regurgitation is not  visualized. No aortic stenosis is present.   5. The inferior vena cava is normal in size with greater than 50%  respiratory variability, suggesting right atrial pressure of 3 mmHg.   EKG: Sinus  I have independently reviewed the above radiologic studies and discussed with the patient   Recent Lab Findings: Lab Results  Component Value Date   WBC 10.2 04/16/2021   HGB 15.6 (H) 04/16/2021   HCT 47.2 (H) 04/16/2021   PLT 267 04/16/2021   GLUCOSE 334 (H) 04/16/2021   CHOL 251 (H) 01/24/2021   TRIG 131 01/24/2021   HDL 48 01/24/2021   LDLCALC 179 (H) 01/24/2021   ALT 18 01/24/2021   AST 13 01/24/2021   NA 136 04/16/2021   K 4.5  04/16/2021   CL 98 04/16/2021   CREATININE 0.62 04/16/2021   BUN 9 04/16/2021   CO2 28 04/16/2021   TSH 1.380 01/24/2021   HGBA1C 11.7 (H) 01/24/2021      Assessment / Plan:   60 year old female with severe three-vessel coronary artery disease.  She also has poorly controlled diabetes.  She continues to smoke a pack and a half of cigarettes daily.  I given her prescription for Chantix to assist with smoking cessation.  She is agreeable to proceed with surgical revascularization.  She will require bypass to the LAD, PDA, and to obtuse marginals.  She is right-handed thus we will harvest the left radial artery.  She is tentatively scheduled in mid October.     I  spent 40 minutes counseling the patient face to face.   Lajuana Matte 05/03/2021 6:54 PM

## 2021-05-09 ENCOUNTER — Ambulatory Visit: Payer: Medicare Other | Admitting: Family Medicine

## 2021-05-15 ENCOUNTER — Ambulatory Visit: Payer: Medicare Other | Admitting: Internal Medicine

## 2021-05-16 NOTE — Pre-Procedure Instructions (Signed)
Surgical Instructions    Your procedure is scheduled on Tuesday 05/21/21.   Report to [facility] Main Entrance "A" at 05:30 A.M., then check in with the Admitting office.  Call this number if you have problems the morning of surgery:  [phone]   If you have any questions prior to your surgery date call [phone]: Open Monday-Friday 8am-4pm    Remember:  Do not eat or drink after midnight the night before your surgery     Take these medicines the morning of surgery with A SIP OF WATER   amLODipine (NORVASC)  bisoprolol (ZEBETA)   rosuvastatin (CRESTOR)  umeclidinium bromide (INCRUSE ELLIPTA)   Take these medicines if needed with A SIP OF WATER  acetaminophen (TYLENOL)  albuterol (PROVENTIL)   albuterol (VENTOLIN HFA) dicyclomine (BENTYL)   nitroGLYCERIN (NITROSTAT)   As of today, STOP taking any Aspirin (unless otherwise instructed by your surgeon) Aleve, Naproxen, Ibuprofen, Motrin, Advil, Goody's, BC's, all herbal medications, fish oil, and all vitamins.  WHAT DO I DO ABOUT MY DIABETES MEDICATION?   Do not take oral diabetes medicines (pills) the morning of surgery.  Do not take glipiZIDE (GLUCOTROL) the evening before surgery and do not take glipiZIDE (GLUCOTROL) the morning of surgery.   Do not take sitaGLIPtin (JANUVIA) the morning of surgery.   The day of surgery, do not take other diabetes injectables, including Byetta (exenatide), Bydureon (exenatide ER), Victoza (liraglutide), or Trulicity (dulaglutide).  If your CBG is greater than 220 mg/dL, you may take  of your sliding scale (correction) dose of insulin.   HOW TO MANAGE YOUR DIABETES BEFORE AND AFTER SURGERY  Why is it important to control my blood sugar before and after surgery? Improving blood sugar levels before and after surgery helps healing and can limit problems. A way of improving blood sugar control is eating a healthy diet by:  Eating less sugar and carbohydrates  Increasing  activity/exercise  Talking with your doctor about reaching your blood sugar goals High blood sugars (greater than 180 mg/dL) can raise your risk of infections and slow your recovery, so you will need to focus on controlling your diabetes during the weeks before surgery. Make sure that the doctor who takes care of your diabetes knows about your planned surgery including the date and location.  How do I manage my blood sugar before surgery? Check your blood sugar at least 4 times a day, starting 2 days before surgery, to make sure that the level is not too high or low.  Check your blood sugar the morning of your surgery when you wake up and every 2 hours until you get to the Short Stay unit.  If your blood sugar is less than 70 mg/dL, you will need to treat for low blood sugar: Do not take insulin. Treat a low blood sugar (less than 70 mg/dL) with  cup of clear juice (cranberry or apple), 4 glucose tablets, OR glucose gel. Recheck blood sugar in 15 minutes after treatment (to make sure it is greater than 70 mg/dL). If your blood sugar is not greater than 70 mg/dL on recheck, call [phone] for further instructions. Report your blood sugar to the short stay nurse when you get to Short Stay.  If you are admitted to the hospital after surgery: Your blood sugar will be checked by the staff and you will probably be given insulin after surgery (instead of oral diabetes medicines) to make sure you have good blood sugar levels. The goal for blood sugar control  after surgery is 80-180 mg/dL.      After your COVID test   You are not required to quarantine however you are required to wear a well-fitting mask when you are out and around people not in your household.  If your mask becomes wet or soiled, replace with a new one.  Wash your hands often with soap and water for 20 seconds or clean your hands with an alcohol-based hand sanitizer that contains at least 60% alcohol.  Do not share personal  items.  Notify your provider: if you are in close contact with someone who has COVID  or if you develop a fever of 100.4 or greater, sneezing, cough, sore throat, shortness of breath or body aches.             Do not wear jewelry or makeup Do not wear lotions, powders, perfumes/colognes, or deodorant. Do not shave 48 hours prior to surgery.  Men may shave face and neck. Do not bring valuables to the hospital. DO Not wear nail polish, gel polish, artificial nails, or any other type of covering on natural nails including finger and toenails. If patients have artificial nails, gel coating, etc. that need to be removed by a nail salon, please have this removed prior to surgery or surgery may need to be canceled/delayed if the surgeon/ anesthesia feels like the patient is unable to be adequately monitored.             [place] is not responsible for any belongings or valuables.  Do NOT Smoke (Tobacco/Vaping)  24 hours prior to your procedure  If you use a CPAP at night, you may bring your mask for your overnight stay.   Contacts, glasses, hearing aids, dentures or partials may not be worn into surgery, please bring cases for these belongings   For patients admitted to the hospital, discharge time will be determined by your treatment team.   Patients discharged the day of surgery will not be allowed to drive home, and someone needs to stay with them for 24 hours.  NO VISITORS WILL BE ALLOWED IN PRE-OP WHERE PATIENTS ARE PREPPED FOR SURGERY.  ONLY 1 SUPPORT PERSON MAY BE PRESENT IN THE WAITING ROOM WHILE YOU ARE IN SURGERY.  IF YOU ARE TO BE ADMITTED, ONCE YOU ARE IN YOUR ROOM YOU WILL BE ALLOWED TWO (2) VISITORS. 1 (ONE) VISITOR MAY STAY OVERNIGHT BUT MUST ARRIVE TO THE ROOM BY 8pm.  Minor children may have two parents present. Special consideration for safety and communication needs will be reviewed on a case by case basis.  Special instructions:    Oral Hygiene is also important to  reduce your risk of infection.  Remember - BRUSH YOUR TEETH THE MORNING OF SURGERY WITH YOUR REGULAR TOOTHPASTE   [place]- Preparing For Surgery  Before surgery, you can play an important role. Because skin is not sterile, your skin needs to be as free of germs as possible. You can reduce the number of germs on your skin by washing with CHG (chlorahexidine gluconate) Soap before surgery.  CHG is an antiseptic cleaner which kills germs and bonds with the skin to continue killing germs even after washing.     Please do not use if you have an allergy to CHG or antibacterial soaps. If your skin becomes reddened/irritated stop using the CHG.  Do not shave (including legs and underarms) for at least 48 hours prior to first CHG shower. It is OK to shave your face.  Please follow these instructions carefully.     Shower the NIGHT BEFORE SURGERY and the MORNING OF SURGERY with CHG Soap.   If you chose to wash your hair, wash your hair first as usual with your normal shampoo. After you shampoo, rinse your hair and body thoroughly to remove the shampoo.  Then [employer] and genitals (private parts) with your normal soap and rinse thoroughly to remove soap.  After that Use CHG Soap as you would any other liquid soap. You can apply CHG directly to the skin and wash gently with a scrungie or a clean washcloth.   Apply the CHG Soap to your body ONLY FROM THE NECK DOWN.  Do not use on open wounds or open sores. Avoid contact with your eyes, ears, mouth and genitals (private parts). Wash Face and genitals (private parts)  with your normal soap.   Wash thoroughly, paying special attention to the area where your surgery will be performed.  Thoroughly rinse your body with warm water from the neck down.  DO NOT shower/wash with your normal soap after using and rinsing off the CHG Soap.  Pat yourself dry with a CLEAN TOWEL.  Wear CLEAN PAJAMAS to bed the night before surgery  Place CLEAN SHEETS on your  bed the night before your surgery  DO NOT SLEEP WITH PETS.   Day of Surgery:  Take a shower with CHG soap. Wear Clean/Comfortable clothing the morning of surgery Do not apply any deodorants/lotions.   Remember to brush your teeth WITH YOUR REGULAR TOOTHPASTE.   Please read over the following fact sheets that you were given.

## 2021-05-17 ENCOUNTER — Encounter (HOSPITAL_COMMUNITY): Payer: Self-pay

## 2021-05-17 ENCOUNTER — Encounter (HOSPITAL_COMMUNITY)
Admission: RE | Admit: 2021-05-17 | Discharge: 2021-05-17 | Disposition: A | Discharge status: Home / Self Care | Payer: Medicare Other | Source: Ambulatory Visit | Attending: Thoracic Surgery (Cardiothoracic Vascular Surgery) | Admitting: Thoracic Surgery (Cardiothoracic Vascular Surgery)

## 2021-05-17 ENCOUNTER — Other Ambulatory Visit: Payer: Self-pay

## 2021-05-17 ENCOUNTER — Ambulatory Visit (HOSPITAL_COMMUNITY)
Admission: RE | Admit: 2021-05-17 | Discharge: 2021-05-17 | Disposition: A | Discharge status: Home / Self Care | Payer: Medicare Other | Source: Ambulatory Visit | Attending: Thoracic Surgery (Cardiothoracic Vascular Surgery) | Admitting: Thoracic Surgery (Cardiothoracic Vascular Surgery)

## 2021-05-17 DIAGNOSIS — Z791 Long term (current) use of non-steroidal anti-inflammatories (NSAID): Secondary | ICD-10-CM | POA: Diagnosis not present

## 2021-05-17 DIAGNOSIS — I252 Old myocardial infarction: Secondary | ICD-10-CM | POA: Insufficient documentation

## 2021-05-17 DIAGNOSIS — Z7951 Long term (current) use of inhaled steroids: Secondary | ICD-10-CM | POA: Diagnosis not present

## 2021-05-17 DIAGNOSIS — E1165 Type 2 diabetes mellitus with hyperglycemia: Secondary | ICD-10-CM

## 2021-05-17 DIAGNOSIS — J449 Chronic obstructive pulmonary disease, unspecified: Secondary | ICD-10-CM | POA: Insufficient documentation

## 2021-05-17 DIAGNOSIS — E119 Type 2 diabetes mellitus without complications: Secondary | ICD-10-CM

## 2021-05-17 DIAGNOSIS — E669 Obesity, unspecified: Secondary | ICD-10-CM | POA: Diagnosis not present

## 2021-05-17 DIAGNOSIS — Z7982 Long term (current) use of aspirin: Secondary | ICD-10-CM | POA: Diagnosis not present

## 2021-05-17 DIAGNOSIS — Z20822 Contact with and (suspected) exposure to covid-19: Secondary | ICD-10-CM | POA: Insufficient documentation

## 2021-05-17 DIAGNOSIS — E785 Hyperlipidemia, unspecified: Secondary | ICD-10-CM | POA: Insufficient documentation

## 2021-05-17 DIAGNOSIS — I2510 Atherosclerotic heart disease of native coronary artery without angina pectoris: Secondary | ICD-10-CM

## 2021-05-17 DIAGNOSIS — Z79899 Other long term (current) drug therapy: Secondary | ICD-10-CM | POA: Diagnosis not present

## 2021-05-17 DIAGNOSIS — Z7984 Long term (current) use of oral hypoglycemic drugs: Secondary | ICD-10-CM | POA: Diagnosis not present

## 2021-05-17 DIAGNOSIS — R0600 Dyspnea, unspecified: Secondary | ICD-10-CM | POA: Diagnosis not present

## 2021-05-17 DIAGNOSIS — R9439 Abnormal result of other cardiovascular function study: Secondary | ICD-10-CM

## 2021-05-17 DIAGNOSIS — Z01818 Encounter for other preprocedural examination: Secondary | ICD-10-CM | POA: Diagnosis not present

## 2021-05-17 DIAGNOSIS — Z6831 Body mass index (BMI) 31.0-31.9, adult: Secondary | ICD-10-CM | POA: Insufficient documentation

## 2021-05-17 DIAGNOSIS — I10 Essential (primary) hypertension: Secondary | ICD-10-CM | POA: Diagnosis not present

## 2021-05-17 HISTORY — DX: Acute myocardial infarction, unspecified: I21.9

## 2021-05-17 HISTORY — DX: Headache, unspecified: R51.9

## 2021-05-17 HISTORY — DX: Unspecified asthma, uncomplicated: J45.909

## 2021-05-17 HISTORY — DX: Dyspnea, unspecified: R06.00

## 2021-05-17 HISTORY — DX: Other specified postprocedural states: Z98.890

## 2021-05-17 HISTORY — DX: Pneumonia, unspecified organism: J18.9

## 2021-05-17 HISTORY — DX: Nausea with vomiting, unspecified: R11.2

## 2021-05-17 LAB — HEMOGLOBIN A1C
Hgb A1c MFr Bld: 10.3 % — ABNORMAL HIGH (ref 4.8–5.6)
Mean Plasma Glucose: 248.91 mg/dL

## 2021-05-17 LAB — BLOOD GAS, ARTERIAL
Acid-Base Excess: 1.4 mmol/L (ref 0.0–2.0)
Bicarbonate: 25.3 mmol/L (ref 20.0–28.0)
FIO2: 21
O2 Saturation: 98.4 %
Patient temperature: 37
pCO2 arterial: 38.7 mmHg (ref 32.0–48.0)
pH, Arterial: 7.432 (ref 7.350–7.450)
pO2, Arterial: 94.3 mmHg (ref 83.0–108.0)

## 2021-05-17 LAB — TYPE AND SCREEN
ABO/RH(D): A POS
Antibody Screen: NEGATIVE

## 2021-05-17 LAB — COMPREHENSIVE METABOLIC PANEL
ALT: 21 U/L (ref 0–44)
AST: 20 U/L (ref 15–41)
Albumin: 3.6 g/dL (ref 3.5–5.0)
Alkaline Phosphatase: 78 U/L (ref 38–126)
Anion gap: 12 (ref 5–15)
BUN: 6 mg/dL — ABNORMAL LOW (ref 8–23)
CO2: 23 mmol/L (ref 22–32)
Calcium: 9.2 mg/dL (ref 8.9–10.3)
Chloride: 101 mmol/L (ref 98–111)
Creatinine, Ser: 0.55 mg/dL (ref 0.44–1.00)
GFR, Estimated: >60 mL/min (ref >60)
Glucose, Bld: 227 mg/dL — ABNORMAL HIGH (ref 70–99)
Potassium: 3.8 mmol/L (ref 3.5–5.1)
Sodium: 136 mmol/L (ref 135–145)
Total Bilirubin: 0.3 mg/dL (ref 0.3–1.2)
Total Protein: 6.7 g/dL (ref 6.5–8.1)

## 2021-05-17 LAB — CBC
HCT: 44.5 % (ref 36.0–46.0)
Hemoglobin: 14.8 g/dL (ref 12.0–15.0)
MCH: 29.2 pg (ref 26.0–34.0)
MCHC: 33.3 g/dL (ref 30.0–36.0)
MCV: 87.8 fL (ref 80.0–100.0)
Platelets: 281 10*3/uL (ref 150–400)
RBC: 5.07 MIL/uL (ref 3.87–5.11)
RDW: 12.2 % (ref 11.5–15.5)
WBC: 11.6 10*3/uL — ABNORMAL HIGH (ref 4.0–10.5)
nRBC: 0 % (ref 0.0–0.2)

## 2021-05-17 LAB — APTT: aPTT: 27 seconds (ref 24–36)

## 2021-05-17 LAB — PROTIME-INR
INR: 1 (ref 0.8–1.2)
Prothrombin Time: 13.1 seconds (ref 11.4–15.2)

## 2021-05-17 LAB — URINALYSIS, ROUTINE W REFLEX MICROSCOPIC
Bilirubin Urine: NEGATIVE
Glucose, UA: 50 mg/dL — AB
Hgb urine dipstick: NEGATIVE
Ketones, ur: NEGATIVE mg/dL
Leukocytes,Ua: NEGATIVE
Nitrite: NEGATIVE
Protein, ur: NEGATIVE mg/dL
Specific Gravity, Urine: 1.011 (ref 1.005–1.030)
pH: 6 (ref 5.0–8.0)

## 2021-05-17 LAB — SURGICAL PCR SCREEN
MRSA, PCR: NEGATIVE
Staphylococcus aureus: NEGATIVE

## 2021-05-17 LAB — SARS CORONAVIRUS 2 (TAT 6-24 HRS): SARS Coronavirus 2: NEGATIVE

## 2021-05-17 LAB — GLUCOSE, CAPILLARY: Glucose-Capillary: 262 mg/dL — ABNORMAL HIGH (ref 70–99)

## 2021-05-17 NOTE — Progress Notes (Signed)
Pre cabg has been completed.   Preliminary results in CV Proc.   Jinny Blossom Catalea Labrecque 05/17/2021 1:36 PM

## 2021-05-17 NOTE — Progress Notes (Signed)
PCP - Dr. Ihor Dow Cardiologist - Dr. Rozann Lesches  PPM/ICD - n/a Device Orders - n/a Rep Notified - n/a  Chest x-ray - 05/17/21 EKG - 05/17/21 Stress Test - 04/12/21 ECHO - 04/25/21 Cardiac Cath - 04/22/21  Sleep Study - denies CPAP - n/a  Type 2 DM CBG today- 262. Patient states she ate at a breakfast buffet this morning.  Checks Blood Sugar rarely. Maybe once a week per patient. When she does check her CBG she states it is usually around 250.  Blood Thinner Instructions: n/a Aspirin Instructions: As of today stop taking Aspirin unless otherwise instructed by your surgeon.   ERAS Protcol - No  COVID TEST- 05/17/21. Pending.   Anesthesia review: Yes. Notified Karoline Caldwell, PA of elevated CBG -262. Per Leamon Arnt see what the labs show and I may followup with Ryan at [facility]".   Patient denies shortness of breath, fever, cough and chest pain at PAT appointment   All instructions explained to the patient, with a verbal understanding of the material. Patient agrees to go over the instructions while at home for a better understanding. Patient also instructed to self quarantine after being tested for COVID-19. The opportunity to ask questions was provided.

## 2021-05-20 ENCOUNTER — Encounter (HOSPITAL_COMMUNITY): Payer: Self-pay

## 2021-05-20 MED ORDER — CEFAZOLIN SODIUM-DEXTROSE 2-4 GM/100ML-% IV SOLN
2.0000 g | INTRAVENOUS | Status: AC
  Administered 2021-05-21 (×2): 2 g via INTRAVENOUS
  Filled 2021-05-20: qty 100

## 2021-05-20 MED ORDER — CEFAZOLIN SODIUM-DEXTROSE 2-4 GM/100ML-% IV SOLN
2.0000 g | INTRAVENOUS | Status: DC
  Filled 2021-05-20: qty 100

## 2021-05-20 MED ORDER — VANCOMYCIN HCL 1250 MG/250ML IV SOLN
1250.0000 mg | INTRAVENOUS | Status: AC
  Administered 2021-05-21: 1250 mg via INTRAVENOUS
  Filled 2021-05-20: qty 250

## 2021-05-20 MED ORDER — TRANEXAMIC ACID (OHS) PUMP PRIME SOLUTION
2.0000 mg/kg | INTRAVENOUS | Status: DC
  Filled 2021-05-20: qty 1.52

## 2021-05-20 MED ORDER — POTASSIUM CHLORIDE 2 MEQ/ML IV SOLN
80.0000 meq | INTRAVENOUS | Status: DC
  Filled 2021-05-20: qty 40

## 2021-05-20 MED ORDER — HEPARIN 30,000 UNITS/1000 ML (OHS) CELLSAVER SOLUTION
Status: DC
  Filled 2021-05-20: qty 1000

## 2021-05-20 MED ORDER — MANNITOL 20 % IV SOLN
INTRAVENOUS | Status: DC
  Filled 2021-05-20: qty 13

## 2021-05-20 MED ORDER — NITROGLYCERIN IN D5W 200-5 MCG/ML-% IV SOLN
2.0000 ug/min | INTRAVENOUS | Status: AC
  Administered 2021-05-21: 5 ug/min via INTRAVENOUS
  Filled 2021-05-20: qty 250

## 2021-05-20 MED ORDER — PLASMA-LYTE A IV SOLN
INTRAVENOUS | Status: DC
  Filled 2021-05-20: qty 5

## 2021-05-20 MED ORDER — TRANEXAMIC ACID (OHS) BOLUS VIA INFUSION
15.0000 mg/kg | INTRAVENOUS | Status: AC
  Administered 2021-05-21: 1137 mg via INTRAVENOUS
  Filled 2021-05-20: qty 1137

## 2021-05-20 MED ORDER — MILRINONE LACTATE IN DEXTROSE 20-5 MG/100ML-% IV SOLN
0.3000 ug/kg/min | INTRAVENOUS | Status: DC
  Filled 2021-05-20: qty 100

## 2021-05-20 MED ORDER — PHENYLEPHRINE HCL-NACL 20-0.9 MG/250ML-% IV SOLN
30.0000 ug/min | INTRAVENOUS | Status: AC
  Administered 2021-05-21: 40 ug/min via INTRAVENOUS
  Filled 2021-05-20: qty 250

## 2021-05-20 MED ORDER — NOREPINEPHRINE 4 MG/250ML-% IV SOLN
0.0000 ug/min | INTRAVENOUS | Status: DC
  Filled 2021-05-20: qty 250

## 2021-05-20 MED ORDER — TRANEXAMIC ACID 1000 MG/10ML IV SOLN
1.5000 mg/kg/h | INTRAVENOUS | Status: AC
  Administered 2021-05-21: 1.5 mg/kg/h via INTRAVENOUS
  Filled 2021-05-20: qty 25

## 2021-05-20 MED ORDER — INSULIN REGULAR(HUMAN) IN NACL 100-0.9 UT/100ML-% IV SOLN
INTRAVENOUS | Status: AC
  Administered 2021-05-21: 13 [IU]/h via INTRAVENOUS
  Filled 2021-05-20: qty 100

## 2021-05-20 MED ORDER — EPINEPHRINE HCL 5 MG/250ML IV SOLN IN NS
0.0000 ug/min | INTRAVENOUS | Status: DC
  Filled 2021-05-20: qty 250

## 2021-05-20 MED ORDER — DEXMEDETOMIDINE HCL IN NACL 400 MCG/100ML IV SOLN
0.1000 ug/kg/h | INTRAVENOUS | Status: AC
  Administered 2021-05-21: .7 ug/kg/h via INTRAVENOUS
  Filled 2021-05-20: qty 100

## 2021-05-20 NOTE — Anesthesia Preprocedure Evaluation (Addendum)
Anesthesia Evaluation  Patient identified by MRN, date of birth, ID band Patient awake    Reviewed: Allergy & Precautions, NPO status , Patient's Chart, lab work & pertinent test results, reviewed documented beta blocker date and time   History of Anesthesia Complications (+) PONV  Airway Mallampati: I  TM Distance: >3 FB Neck ROM: Full    Dental  (+) Missing, Dental Advisory Given, Poor Dentition, Loose   Pulmonary COPD,  COPD inhaler, Current Smoker and Patient abstained from smoking.,  05/17/2021 SARS coronavirus NEG   breath sounds clear to auscultation       Cardiovascular hypertension, Pt. on medications and Pt. on home beta blockers + angina + CAD (3v ASCAD) and + Past MI   Rhythm:Regular Rate:Normal  04/25/2021 ECHO: EF 60-65%, normal LVF, no regional wall motion abnormalities, no significant valvular abnormalities   Neuro/Psych  Headaches, Anxiety Depression    GI/Hepatic negative GI ROS, Neg liver ROS,   Endo/Other  diabetes (glu 258), Oral Hypoglycemic Agents  Renal/GU negative Renal ROS     Musculoskeletal   Abdominal   Peds  Hematology negative hematology ROS (+)   Anesthesia Other Findings   Reproductive/Obstetrics                           Anesthesia Physical Anesthesia Plan  ASA: 3  Anesthesia Plan: General   Post-op Pain Management:    Induction: Intravenous  PONV Risk Score and Plan: 3 and Ondansetron and Treatment may vary due to age or medical condition  Airway Management Planned: Oral ETT  Additional Equipment: Arterial line, CVP, TEE and Ultrasound Guidance Line Placement  Intra-op Plan:   Post-operative Plan: Post-operative intubation/ventilation  Informed Consent: I have reviewed the patients History and Physical, chart, labs and discussed the procedure including the risks, benefits and alternatives for the proposed anesthesia with the patient or  authorized representative who has indicated his/her understanding and acceptance.     Dental advisory given  Plan Discussed with: CRNA and Surgeon  Anesthesia Plan Comments: (PAT note written 05/20/2021 by Myra Gianotti, PA-C. )      Anesthesia Quick Evaluation

## 2021-05-20 NOTE — Progress Notes (Signed)
Anesthesia Chart Review:  Case: [id number] Date/Time: 05/21/21 0715   Procedures:      CORONARY ARTERY BYPASS GRAFTING (CABG) (Chest)     RADIAL ARTERY HARVEST (Left: Arm Lower)     TRANSESOPHAGEAL ECHOCARDIOGRAM (TEE)   Anesthesia type: General   Pre-op diagnosis: CAD   Location: [facility] OR ROOM 31 / [facility]   Surgeons: Lajuana Matte, MD       DISCUSSION: Patient is a 60 year old female scheduled for the above procedure.  History includes smoking, postoperative N/V, COPD, CAD (severe 3V CAD; 03/25/18 NST showed findings c/w prior anterior/anteroseptal MI), DM2, HLD, HTN, dyspnea, asthma.  BMI is consistent with obesity.  A1c 10.3%, down from 11.7% in June. She does not routinely check her home CBGs, but when she does it is reportedly "usually around 250". Result routed to [facility], but Dr. Kipp Brood already noted history of poorly controlled DM with A1c 11.7% in his 05/03/21 consult note.   05/17/2021 presurgical COVID-19 test negative.  Anesthesia team to evaluate on the day of surgery.   VS: BP (!) 143/71   Pulse 86   Temp 37.6 C (Oral)   Resp 18   Ht 5\' 1"  (1.549 m)   Wt 75.8 kg   SpO2 97%   BMI 31.59 kg/m   PROVIDERS: Lindell Spar, MD is PCP  Rozann Lesches, MD is cardiologist   LABS: Preoperative labs noted.  See DISCUSSION.   (all labs ordered are listed, but only abnormal results are displayed)  Labs Reviewed  GLUCOSE, CAPILLARY - Abnormal; Notable for the following components:      Result Value   Glucose-Capillary 262 (*)    All other components within normal limits  CBC - Abnormal; Notable for the following components:   WBC 11.6 (*)    All other components within normal limits  COMPREHENSIVE METABOLIC PANEL - Abnormal; Notable for the following components:   Glucose, Bld 227 (*)    BUN 6 (*)    All other components within normal limits  URINALYSIS, ROUTINE W REFLEX MICROSCOPIC - Abnormal; Notable for the following components:   Glucose, UA 50 (*)     All other components within normal limits  BLOOD GAS, ARTERIAL - Abnormal; Notable for the following components:   Allens test (pass/fail) BRACHIAL ARTERY (*)    All other components within normal limits  HEMOGLOBIN A1C - Abnormal; Notable for the following components:   Hgb A1c MFr Bld 10.3 (*)    All other components within normal limits  SURGICAL PCR SCREEN  SARS CORONAVIRUS 2 (TAT 6-24 HRS)  PROTIME-INR  APTT  TYPE AND SCREEN     IMAGES: CXR 05/17/21: IMPRESSION: Findings suggestive of mild lung hyperexpansion and chronic bronchitic change without superimposed acute cardiopulmonary disease.    EKG: 05/17/21: NSR   CV: US Carotid 05/17/21: Summary:  Right Carotid: Velocities in the right ICA are consistent with a 40-59%                 stenosis.  Left Carotid: Velocities in the left ICA are consistent with a 1-39%  stenosis.  Vertebrals: Bilateral vertebral arteries demonstrate antegrade flow.    Echo 04/25/21: IMPRESSIONS   1. Left ventricular ejection fraction, by estimation, is 60 to 65%. The  left ventricle has normal function. The left ventricle has no regional  wall motion abnormalities. Left ventricular diastolic parameters are  consistent with Grade I diastolic  dysfunction (impaired relaxation).   2. Right ventricular systolic function is normal.  The right ventricular  size is normal.   3. The mitral valve is normal in structure. No evidence of mitral valve  regurgitation. No evidence of mitral stenosis.   4. The aortic valve is tricuspid. Aortic valve regurgitation is not  visualized. No aortic stenosis is present.   5. The inferior vena cava is normal in size with greater than 50%  respiratory variability, suggesting right atrial pressure of 3 mmHg.    LHC 04/22/21: Conclusion: Significant three-vessel CAD that has progressed since 2019, inlcluding long segment of mid LAD disease of up to 75% (RFR = 0.83), 60% proximal LCx stenosis followed by  sequential 30% and 90% OM1 lesions, and 70% mid RCA disease. Normal left ventricular systolic function and filling pressure.   Recommendations: Outpatient cardiac surgery consultation for CABG...  Past Medical History:  Diagnosis Date   Anxiety    Asthma    CAD (coronary artery disease)    Multivessel disease 05/2018   Chronic back pain    COPD (chronic obstructive pulmonary disease) (HCC)    Depression    Dyspnea    Essential hypertension    Headache    Hyperlipidemia    Myocardial infarction (HCC)    Pneumonia    PONV (postoperative nausea and vomiting)    Type 2 diabetes mellitus ([place])     Past Surgical History:  Procedure Laterality Date   BREAST BIOPSY Left    COLONOSCOPY N/A 04/26/2018   Procedure: COLONOSCOPY;  Surgeon: Rogene Houston, MD;  Location: [facility];  Service: Endoscopy;  Laterality: N/A;  1:15   INTRAVASCULAR PRESSURE WIRE/FFR STUDY N/A 05/12/2018   Procedure: INTRAVASCULAR PRESSURE WIRE/FFR STUDY;  Surgeon: Nelva Bush, MD;  Location: [facility] CV LAB;  Service: Cardiovascular;  Laterality: N/A;   INTRAVASCULAR PRESSURE WIRE/FFR STUDY N/A 04/22/2021   Procedure: INTRAVASCULAR PRESSURE WIRE/FFR STUDY;  Surgeon: Nelva Bush, MD;  Location: [place] CV LAB;  Service: Cardiovascular;  Laterality: N/A;   LEFT HEART CATH AND CORONARY ANGIOGRAPHY N/A 05/12/2018   Procedure: LEFT HEART CATH AND CORONARY ANGIOGRAPHY;  Surgeon: Nelva Bush, MD;  Location: [facility] CV LAB;  Service: Cardiovascular;  Laterality: N/A;   LEFT HEART CATH AND CORONARY ANGIOGRAPHY N/A 04/22/2021   Procedure: LEFT HEART CATH AND CORONARY ANGIOGRAPHY;  Surgeon: Nelva Bush, MD;  Location: [facility] CV LAB;  Service: Cardiovascular;  Laterality: N/A;   POLYPECTOMY  04/26/2018   Procedure: POLYPECTOMY;  Surgeon: Rogene Houston, MD;  [facility]: [facility];  Service: Endoscopy;;  colon    TOTAL VAGINAL HYSTERECTOMY      MEDICATIONS:  acetaminophen (TYLENOL)  500 MG tablet   albuterol (PROVENTIL) (2.5 MG/3ML) 0.083% nebulizer solution   albuterol (VENTOLIN HFA) 108 (90 Base) MCG/ACT inhaler   amLODipine (NORVASC) 10 MG tablet   aspirin EC 81 MG tablet   bisoprolol (ZEBETA) 5 MG tablet   dicyclomine (BENTYL) 10 MG capsule   glipiZIDE (GLUCOTROL) 5 MG tablet   ibuprofen (ADVIL) 200 MG tablet   lisinopril (ZESTRIL) 10 MG tablet   rosuvastatin (CRESTOR) 10 MG tablet   sitaGLIPtin (JANUVIA) 100 MG tablet   varenicline (CHANTIX) 0.5 MG tablet   nitroGLYCERIN (NITROSTAT) 0.4 MG SL tablet   umeclidinium bromide (INCRUSE ELLIPTA) 62.5 MCG/INH AEPB   No current facility-administered medications for this encounter.    [START ON 05/21/2021] ceFAZolin (ANCEF) IVPB 2g/100 mL premix   [START ON 05/21/2021] ceFAZolin (ANCEF) IVPB 2g/100 mL premix   [START ON 05/21/2021] dexmedetomidine (PRECEDEX) 400 MCG/100ML (4 mcg/mL) infusion   [  START ON 05/21/2021] EPINEPHrine (ADRENALIN) 5 mg in NS 250 mL (0.02 mg/mL) premix infusion   [START ON 05/21/2021] heparin 30,000 units/NS 1000 mL solution for CELLSAVER   [START ON 05/21/2021] heparin sodium (porcine) 5,000 Units, papaverine 60 mg in electrolyte-A (PLASMALYTE-A PH 7.4) 1,000 mL irrigation   [START ON 05/21/2021] insulin regular, human (MYXREDLIN) 100 units/ 100 mL infusion   [START ON 05/21/2021] [place] Blood Cardioplegia vial (lidocaine/magnesium/mannitol 0.26g-4g-6.4g)   [START ON 05/21/2021] milrinone (PRIMACOR) 20 MG/100 ML (0.2 mg/mL) infusion   [START ON 05/21/2021] nitroGLYCERIN 50 mg in dextrose 5 % 250 mL (0.2 mg/mL) infusion   [START ON 05/21/2021] norepinephrine (LEVOPHED) 4mg  in 265mL premix infusion   [START ON 05/21/2021] phenylephrine (NEO-SYNEPHRINE) 20mg /NS 281mL premix infusion   [START ON 05/21/2021] potassium chloride injection 80 mEq   [START ON 05/21/2021] tranexamic acid (CYKLOKAPRON) 2,500 mg in sodium chloride 0.9 % 250 mL (10 mg/mL) infusion   [START ON 05/21/2021] tranexamic  acid (CYKLOKAPRON) bolus via infusion - over 30 minutes 1,137 mg   [START ON 05/21/2021] tranexamic acid (CYKLOKAPRON) pump prime solution 152 mg   [START ON 05/21/2021] vancomycin (VANCOREADY) IVPB 1250 mg/250 mL    Myra Gianotti, PA-C Surgical Short Stay/Anesthesiology [facility] Phone [phone] [facility] Phone [phone] 05/20/2021 10:10 AM

## 2021-05-21 ENCOUNTER — Inpatient Hospital Stay (HOSPITAL_COMMUNITY)
Admission: RE | Admit: 2021-05-21 | Discharge: 2021-05-26 | DRG: 236 | Disposition: A | Discharge status: Home Health | Payer: Medicare Other | Source: Ambulatory Visit | Attending: Thoracic Surgery (Cardiothoracic Vascular Surgery) | Admitting: Thoracic Surgery (Cardiothoracic Vascular Surgery)

## 2021-05-21 ENCOUNTER — Encounter (HOSPITAL_COMMUNITY): Payer: Self-pay | Admitting: Thoracic Surgery (Cardiothoracic Vascular Surgery)

## 2021-05-21 ENCOUNTER — Other Ambulatory Visit: Payer: Self-pay

## 2021-05-21 ENCOUNTER — Inpatient Hospital Stay (HOSPITAL_COMMUNITY): Payer: Medicare Other | Admitting: Physician Assistant

## 2021-05-21 ENCOUNTER — Inpatient Hospital Stay (HOSPITAL_COMMUNITY): Payer: Medicare Other

## 2021-05-21 ENCOUNTER — Inpatient Hospital Stay (HOSPITAL_COMMUNITY)
Admission: RE | Disposition: A | Discharge status: Home / Self Care | Payer: Self-pay | Source: Ambulatory Visit | Attending: Thoracic Surgery (Cardiothoracic Vascular Surgery)

## 2021-05-21 DIAGNOSIS — Z79899 Other long term (current) drug therapy: Secondary | ICD-10-CM | POA: Diagnosis not present

## 2021-05-21 DIAGNOSIS — E119 Type 2 diabetes mellitus without complications: Secondary | ICD-10-CM | POA: Diagnosis not present

## 2021-05-21 DIAGNOSIS — Z90710 Acquired absence of both cervix and uterus: Secondary | ICD-10-CM

## 2021-05-21 DIAGNOSIS — R9439 Abnormal result of other cardiovascular function study: Secondary | ICD-10-CM

## 2021-05-21 DIAGNOSIS — E785 Hyperlipidemia, unspecified: Secondary | ICD-10-CM | POA: Diagnosis present

## 2021-05-21 DIAGNOSIS — F32A Depression, unspecified: Secondary | ICD-10-CM | POA: Diagnosis present

## 2021-05-21 DIAGNOSIS — I2584 Coronary atherosclerosis due to calcified coronary lesion: Secondary | ICD-10-CM | POA: Diagnosis not present

## 2021-05-21 DIAGNOSIS — D72828 Other elevated white blood cell count: Secondary | ICD-10-CM | POA: Diagnosis not present

## 2021-05-21 DIAGNOSIS — I252 Old myocardial infarction: Secondary | ICD-10-CM | POA: Diagnosis not present

## 2021-05-21 DIAGNOSIS — Z7984 Long term (current) use of oral hypoglycemic drugs: Secondary | ICD-10-CM

## 2021-05-21 DIAGNOSIS — D62 Acute posthemorrhagic anemia: Secondary | ICD-10-CM | POA: Diagnosis not present

## 2021-05-21 DIAGNOSIS — Z7982 Long term (current) use of aspirin: Secondary | ICD-10-CM | POA: Diagnosis not present

## 2021-05-21 DIAGNOSIS — Z951 Presence of aortocoronary bypass graft: Secondary | ICD-10-CM

## 2021-05-21 DIAGNOSIS — I2510 Atherosclerotic heart disease of native coronary artery without angina pectoris: Principal | ICD-10-CM | POA: Diagnosis present

## 2021-05-21 DIAGNOSIS — I4891 Unspecified atrial fibrillation: Secondary | ICD-10-CM | POA: Diagnosis not present

## 2021-05-21 DIAGNOSIS — I10 Essential (primary) hypertension: Secondary | ICD-10-CM | POA: Diagnosis present

## 2021-05-21 DIAGNOSIS — F17210 Nicotine dependence, cigarettes, uncomplicated: Secondary | ICD-10-CM | POA: Diagnosis present

## 2021-05-21 DIAGNOSIS — F419 Anxiety disorder, unspecified: Secondary | ICD-10-CM | POA: Diagnosis present

## 2021-05-21 DIAGNOSIS — Z8249 Family history of ischemic heart disease and other diseases of the circulatory system: Secondary | ICD-10-CM | POA: Diagnosis not present

## 2021-05-21 DIAGNOSIS — Z20822 Contact with and (suspected) exposure to covid-19: Secondary | ICD-10-CM | POA: Diagnosis present

## 2021-05-21 DIAGNOSIS — E8770 Fluid overload, unspecified: Secondary | ICD-10-CM | POA: Diagnosis not present

## 2021-05-21 DIAGNOSIS — E1165 Type 2 diabetes mellitus with hyperglycemia: Secondary | ICD-10-CM | POA: Diagnosis present

## 2021-05-21 DIAGNOSIS — J449 Chronic obstructive pulmonary disease, unspecified: Secondary | ICD-10-CM | POA: Diagnosis present

## 2021-05-21 DIAGNOSIS — J939 Pneumothorax, unspecified: Secondary | ICD-10-CM

## 2021-05-21 DIAGNOSIS — J90 Pleural effusion, not elsewhere classified: Secondary | ICD-10-CM

## 2021-05-21 HISTORY — PX: RADIAL ARTERY HARVEST: SHX5067

## 2021-05-21 HISTORY — PX: ENDOVEIN HARVEST OF GREATER SAPHENOUS VEIN: SHX5059

## 2021-05-21 HISTORY — PX: CORONARY ARTERY BYPASS GRAFT: SHX141

## 2021-05-21 HISTORY — PX: TEE WITHOUT CARDIOVERSION: SHX5443

## 2021-05-21 LAB — BASIC METABOLIC PANEL
Anion gap: 3 — ABNORMAL LOW (ref 5–15)
BUN: <5 mg/dL — ABNORMAL LOW (ref 8–23)
CO2: 26 mmol/L (ref 22–32)
Calcium: 7.6 mg/dL — ABNORMAL LOW (ref 8.9–10.3)
Chloride: 109 mmol/L (ref 98–111)
Creatinine, Ser: 0.49 mg/dL (ref 0.44–1.00)
GFR, Estimated: >60 mL/min (ref >60)
Glucose, Bld: 119 mg/dL — ABNORMAL HIGH (ref 70–99)
Potassium: 3.7 mmol/L (ref 3.5–5.1)
Sodium: 138 mmol/L (ref 135–145)

## 2021-05-21 LAB — POCT I-STAT, CHEM 8
BUN: 6 mg/dL — ABNORMAL LOW (ref 8–23)
BUN: 5 mg/dL — ABNORMAL LOW (ref 8–23)
BUN: 6 mg/dL — ABNORMAL LOW (ref 8–23)
BUN: 5 mg/dL — ABNORMAL LOW (ref 8–23)
BUN: 4 mg/dL — ABNORMAL LOW (ref 8–23)
BUN: 5 mg/dL — ABNORMAL LOW (ref 8–23)
Calcium, Ion: 1.23 mmol/L (ref 1.15–1.40)
Calcium, Ion: 1.27 mmol/L (ref 1.15–1.40)
Calcium, Ion: 1.27 mmol/L (ref 1.15–1.40)
Calcium, Ion: 1.02 mmol/L — ABNORMAL LOW (ref 1.15–1.40)
Calcium, Ion: 1.04 mmol/L — ABNORMAL LOW (ref 1.15–1.40)
Calcium, Ion: 1.27 mmol/L (ref 1.15–1.40)
Chloride: 102 mmol/L (ref 98–111)
Chloride: 104 mmol/L (ref 98–111)
Chloride: 105 mmol/L (ref 98–111)
Chloride: 104 mmol/L (ref 98–111)
Chloride: 104 mmol/L (ref 98–111)
Chloride: 107 mmol/L (ref 98–111)
Creatinine, Ser: 0.3 mg/dL — ABNORMAL LOW (ref 0.44–1.00)
Creatinine, Ser: 0.3 mg/dL — ABNORMAL LOW (ref 0.44–1.00)
Creatinine, Ser: 0.3 mg/dL — ABNORMAL LOW (ref 0.44–1.00)
Creatinine, Ser: 0.2 mg/dL — ABNORMAL LOW (ref 0.44–1.00)
Creatinine, Ser: 0.2 mg/dL — ABNORMAL LOW (ref 0.44–1.00)
Creatinine, Ser: 0.3 mg/dL — ABNORMAL LOW (ref 0.44–1.00)
Glucose, Bld: 271 mg/dL — ABNORMAL HIGH (ref 70–99)
Glucose, Bld: 213 mg/dL — ABNORMAL HIGH (ref 70–99)
Glucose, Bld: 223 mg/dL — ABNORMAL HIGH (ref 70–99)
Glucose, Bld: 150 mg/dL — ABNORMAL HIGH (ref 70–99)
Glucose, Bld: 134 mg/dL — ABNORMAL HIGH (ref 70–99)
Glucose, Bld: 147 mg/dL — ABNORMAL HIGH (ref 70–99)
HCT: 36 % (ref 36.0–46.0)
HCT: 38 % (ref 36.0–46.0)
HCT: 39 % (ref 36.0–46.0)
HCT: 26 % — ABNORMAL LOW (ref 36.0–46.0)
HCT: 25 % — ABNORMAL LOW (ref 36.0–46.0)
HCT: 26 % — ABNORMAL LOW (ref 36.0–46.0)
Hemoglobin: 12.2 g/dL (ref 12.0–15.0)
Hemoglobin: 12.9 g/dL (ref 12.0–15.0)
Hemoglobin: 13.3 g/dL (ref 12.0–15.0)
Hemoglobin: 8.8 g/dL — ABNORMAL LOW (ref 12.0–15.0)
Hemoglobin: 8.5 g/dL — ABNORMAL LOW (ref 12.0–15.0)
Hemoglobin: 8.8 g/dL — ABNORMAL LOW (ref 12.0–15.0)
Potassium: 4 mmol/L (ref 3.5–5.1)
Potassium: 3.2 mmol/L — ABNORMAL LOW (ref 3.5–5.1)
Potassium: 3.2 mmol/L — ABNORMAL LOW (ref 3.5–5.1)
Potassium: 3.7 mmol/L (ref 3.5–5.1)
Potassium: 3.6 mmol/L (ref 3.5–5.1)
Potassium: 4.2 mmol/L (ref 3.5–5.1)
Sodium: 139 mmol/L (ref 135–145)
Sodium: 141 mmol/L (ref 135–145)
Sodium: 141 mmol/L (ref 135–145)
Sodium: 142 mmol/L (ref 135–145)
Sodium: 141 mmol/L (ref 135–145)
Sodium: 142 mmol/L (ref 135–145)
TCO2: 25 mmol/L (ref 22–32)
TCO2: 25 mmol/L (ref 22–32)
TCO2: 25 mmol/L (ref 22–32)
TCO2: 24 mmol/L (ref 22–32)
TCO2: 25 mmol/L (ref 22–32)
TCO2: 27 mmol/L (ref 22–32)

## 2021-05-21 LAB — POCT I-STAT 7, (LYTES, BLD GAS, ICA,H+H)
Acid-Base Excess: 0 mmol/L (ref 0.0–2.0)
Acid-Base Excess: 1 mmol/L (ref 0.0–2.0)
Acid-base deficit: 2 mmol/L (ref 0.0–2.0)
Acid-base deficit: 1 mmol/L (ref 0.0–2.0)
Acid-base deficit: 1 mmol/L (ref 0.0–2.0)
Acid-base deficit: 1 mmol/L (ref 0.0–2.0)
Acid-base deficit: 3 mmol/L — ABNORMAL HIGH (ref 0.0–2.0)
Acid-base deficit: 4 mmol/L — ABNORMAL HIGH (ref 0.0–2.0)
Acid-base deficit: 2 mmol/L (ref 0.0–2.0)
Bicarbonate: 25.3 mmol/L (ref 20.0–28.0)
Bicarbonate: 25.4 mmol/L (ref 20.0–28.0)
Bicarbonate: 23.9 mmol/L (ref 20.0–28.0)
Bicarbonate: 24.6 mmol/L (ref 20.0–28.0)
Bicarbonate: 25.5 mmol/L (ref 20.0–28.0)
Bicarbonate: 25.7 mmol/L (ref 20.0–28.0)
Bicarbonate: 26.4 mmol/L (ref 20.0–28.0)
Bicarbonate: 22.3 mmol/L (ref 20.0–28.0)
Bicarbonate: 24.1 mmol/L (ref 20.0–28.0)
Calcium, Ion: 1.2 mmol/L (ref 1.15–1.40)
Calcium, Ion: 1.01 mmol/L — ABNORMAL LOW (ref 1.15–1.40)
Calcium, Ion: 1.03 mmol/L — ABNORMAL LOW (ref 1.15–1.40)
Calcium, Ion: 1.04 mmol/L — ABNORMAL LOW (ref 1.15–1.40)
Calcium, Ion: 0.99 mmol/L — ABNORMAL LOW (ref 1.15–1.40)
Calcium, Ion: 1.27 mmol/L (ref 1.15–1.40)
Calcium, Ion: 1.25 mmol/L (ref 1.15–1.40)
Calcium, Ion: 1.13 mmol/L — ABNORMAL LOW (ref 1.15–1.40)
Calcium, Ion: 1.11 mmol/L — ABNORMAL LOW (ref 1.15–1.40)
HCT: 38 % (ref 36.0–46.0)
HCT: 28 % — ABNORMAL LOW (ref 36.0–46.0)
HCT: 26 % — ABNORMAL LOW (ref 36.0–46.0)
HCT: 26 % — ABNORMAL LOW (ref 36.0–46.0)
HCT: 26 % — ABNORMAL LOW (ref 36.0–46.0)
HCT: 26 % — ABNORMAL LOW (ref 36.0–46.0)
HCT: 32 % — ABNORMAL LOW (ref 36.0–46.0)
HCT: 31 % — ABNORMAL LOW (ref 36.0–46.0)
HCT: 29 % — ABNORMAL LOW (ref 36.0–46.0)
Hemoglobin: 12.9 g/dL (ref 12.0–15.0)
Hemoglobin: 9.5 g/dL — ABNORMAL LOW (ref 12.0–15.0)
Hemoglobin: 8.8 g/dL — ABNORMAL LOW (ref 12.0–15.0)
Hemoglobin: 8.8 g/dL — ABNORMAL LOW (ref 12.0–15.0)
Hemoglobin: 8.8 g/dL — ABNORMAL LOW (ref 12.0–15.0)
Hemoglobin: 8.8 g/dL — ABNORMAL LOW (ref 12.0–15.0)
Hemoglobin: 10.9 g/dL — ABNORMAL LOW (ref 12.0–15.0)
Hemoglobin: 10.5 g/dL — ABNORMAL LOW (ref 12.0–15.0)
Hemoglobin: 9.9 g/dL — ABNORMAL LOW (ref 12.0–15.0)
O2 Saturation: 88 %
O2 Saturation: 100 %
O2 Saturation: 100 %
O2 Saturation: 100 %
O2 Saturation: 100 %
O2 Saturation: 100 %
O2 Saturation: 94 %
O2 Saturation: 92 %
O2 Saturation: 92 %
Patient temperature: 37.1
Patient temperature: 37.3
Patient temperature: 37.3
Potassium: 4 mmol/L (ref 3.5–5.1)
Potassium: 3.5 mmol/L (ref 3.5–5.1)
Potassium: 3.7 mmol/L (ref 3.5–5.1)
Potassium: 3.9 mmol/L (ref 3.5–5.1)
Potassium: 3.7 mmol/L (ref 3.5–5.1)
Potassium: 4.2 mmol/L (ref 3.5–5.1)
Potassium: 4 mmol/L (ref 3.5–5.1)
Potassium: 3.7 mmol/L (ref 3.5–5.1)
Potassium: 3.6 mmol/L (ref 3.5–5.1)
Sodium: 140 mmol/L (ref 135–145)
Sodium: 142 mmol/L (ref 135–145)
Sodium: 142 mmol/L (ref 135–145)
Sodium: 142 mmol/L (ref 135–145)
Sodium: 142 mmol/L (ref 135–145)
Sodium: 143 mmol/L (ref 135–145)
Sodium: 142 mmol/L (ref 135–145)
Sodium: 141 mmol/L (ref 135–145)
Sodium: 143 mmol/L (ref 135–145)
TCO2: 27 mmol/L (ref 22–32)
TCO2: 27 mmol/L (ref 22–32)
TCO2: 25 mmol/L (ref 22–32)
TCO2: 26 mmol/L (ref 22–32)
TCO2: 27 mmol/L (ref 22–32)
TCO2: 27 mmol/L (ref 22–32)
TCO2: 29 mmol/L (ref 22–32)
TCO2: 24 mmol/L (ref 22–32)
TCO2: 26 mmol/L (ref 22–32)
pCO2 arterial: 53.6 mmHg — ABNORMAL HIGH (ref 32.0–48.0)
pCO2 arterial: 49.8 mmHg — ABNORMAL HIGH (ref 32.0–48.0)
pCO2 arterial: 39.5 mmHg (ref 32.0–48.0)
pCO2 arterial: 40.9 mmHg (ref 32.0–48.0)
pCO2 arterial: 42.4 mmHg (ref 32.0–48.0)
pCO2 arterial: 50.5 mmHg — ABNORMAL HIGH (ref 32.0–48.0)
pCO2 arterial: 72.4 mmHg (ref 32.0–48.0)
pCO2 arterial: 46.2 mmHg (ref 32.0–48.0)
pCO2 arterial: 48.4 mmHg — ABNORMAL HIGH (ref 32.0–48.0)
pH, Arterial: 7.282 — ABNORMAL LOW (ref 7.350–7.450)
pH, Arterial: 7.315 — ABNORMAL LOW (ref 7.350–7.450)
pH, Arterial: 7.374 (ref 7.350–7.450)
pH, Arterial: 7.402 (ref 7.350–7.450)
pH, Arterial: 7.311 — ABNORMAL LOW (ref 7.350–7.450)
pH, Arterial: 7.39 (ref 7.350–7.450)
pH, Arterial: 7.17 — CL (ref 7.350–7.450)
pH, Arterial: 7.293 — ABNORMAL LOW (ref 7.350–7.450)
pH, Arterial: 7.306 — ABNORMAL LOW (ref 7.350–7.450)
pO2, Arterial: 62 mmHg — ABNORMAL LOW (ref 83.0–108.0)
pO2, Arterial: 328 mmHg — ABNORMAL HIGH (ref 83.0–108.0)
pO2, Arterial: 346 mmHg — ABNORMAL HIGH (ref 83.0–108.0)
pO2, Arterial: 351 mmHg — ABNORMAL HIGH (ref 83.0–108.0)
pO2, Arterial: 321 mmHg — ABNORMAL HIGH (ref 83.0–108.0)
pO2, Arterial: 389 mmHg — ABNORMAL HIGH (ref 83.0–108.0)
pO2, Arterial: 91 mmHg (ref 83.0–108.0)
pO2, Arterial: 73 mmHg — ABNORMAL LOW (ref 83.0–108.0)
pO2, Arterial: 71 mmHg — ABNORMAL LOW (ref 83.0–108.0)

## 2021-05-21 LAB — CBC
HCT: 34.7 % — ABNORMAL LOW (ref 36.0–46.0)
HCT: 32.3 % — ABNORMAL LOW (ref 36.0–46.0)
Hemoglobin: 11.3 g/dL — ABNORMAL LOW (ref 12.0–15.0)
Hemoglobin: 10.5 g/dL — ABNORMAL LOW (ref 12.0–15.0)
MCH: 29.6 pg (ref 26.0–34.0)
MCH: 29.3 pg (ref 26.0–34.0)
MCHC: 32.6 g/dL (ref 30.0–36.0)
MCHC: 32.5 g/dL (ref 30.0–36.0)
MCV: 90.8 fL (ref 80.0–100.0)
MCV: 90.2 fL (ref 80.0–100.0)
Platelets: 201 10*3/uL (ref 150–400)
Platelets: 183 10*3/uL (ref 150–400)
RBC: 3.82 MIL/uL — ABNORMAL LOW (ref 3.87–5.11)
RBC: 3.58 MIL/uL — ABNORMAL LOW (ref 3.87–5.11)
RDW: 12.3 % (ref 11.5–15.5)
RDW: 12.4 % (ref 11.5–15.5)
WBC: 21.1 10*3/uL — ABNORMAL HIGH (ref 4.0–10.5)
WBC: 17.3 10*3/uL — ABNORMAL HIGH (ref 4.0–10.5)
nRBC: 0 % (ref 0.0–0.2)
nRBC: 0 % (ref 0.0–0.2)

## 2021-05-21 LAB — POCT I-STAT EG7
Acid-base deficit: 1 mmol/L (ref 0.0–2.0)
Bicarbonate: 25.9 mmol/L (ref 20.0–28.0)
Calcium, Ion: 1.04 mmol/L — ABNORMAL LOW (ref 1.15–1.40)
HCT: 27 % — ABNORMAL LOW (ref 36.0–46.0)
Hemoglobin: 9.2 g/dL — ABNORMAL LOW (ref 12.0–15.0)
O2 Saturation: 72 %
Potassium: 4.7 mmol/L (ref 3.5–5.1)
Sodium: 141 mmol/L (ref 135–145)
TCO2: 28 mmol/L (ref 22–32)
pCO2, Ven: 53.8 mmHg (ref 44.0–60.0)
pH, Ven: 7.291 (ref 7.250–7.430)
pO2, Ven: 43 mmHg (ref 32.0–45.0)

## 2021-05-21 LAB — GLUCOSE, CAPILLARY
Glucose-Capillary: 258 mg/dL — ABNORMAL HIGH (ref 70–99)
Glucose-Capillary: 135 mg/dL — ABNORMAL HIGH (ref 70–99)
Glucose-Capillary: 123 mg/dL — ABNORMAL HIGH (ref 70–99)
Glucose-Capillary: 116 mg/dL — ABNORMAL HIGH (ref 70–99)
Glucose-Capillary: 127 mg/dL — ABNORMAL HIGH (ref 70–99)
Glucose-Capillary: 139 mg/dL — ABNORMAL HIGH (ref 70–99)
Glucose-Capillary: 115 mg/dL — ABNORMAL HIGH (ref 70–99)
Glucose-Capillary: 201 mg/dL — ABNORMAL HIGH (ref 70–99)
Glucose-Capillary: 175 mg/dL — ABNORMAL HIGH (ref 70–99)
Glucose-Capillary: 158 mg/dL — ABNORMAL HIGH (ref 70–99)

## 2021-05-21 LAB — HEMOGLOBIN AND HEMATOCRIT, BLOOD
HCT: 27.6 % — ABNORMAL LOW (ref 36.0–46.0)
Hemoglobin: 8.8 g/dL — ABNORMAL LOW (ref 12.0–15.0)

## 2021-05-21 LAB — ECHO INTRAOPERATIVE TEE
AV Mean grad: 6 mmHg
AV Peak grad: 9.4 mmHg
Ao pk vel: 1.53 m/s
Height: 61 in
S' Lateral: 3.84 cm
Weight: 2673.74 oz

## 2021-05-21 LAB — ABO/RH: ABO/RH(D): A POS

## 2021-05-21 LAB — PROTIME-INR
INR: 1.2 (ref 0.8–1.2)
Prothrombin Time: 15.7 seconds — ABNORMAL HIGH (ref 11.4–15.2)

## 2021-05-21 LAB — PLATELET COUNT: Platelets: 219 10*3/uL (ref 150–400)

## 2021-05-21 LAB — MAGNESIUM: Magnesium: 2.7 mg/dL — ABNORMAL HIGH (ref 1.7–2.4)

## 2021-05-21 LAB — APTT: aPTT: 27 seconds (ref 24–36)

## 2021-05-21 SURGERY — CORONARY ARTERY BYPASS GRAFTING (CABG)
Anesthesia: General | Site: Chest | Laterality: Right

## 2021-05-21 MED ORDER — POTASSIUM CHLORIDE 10 MEQ/50ML IV SOLN: 10.0000 meq | INTRAVENOUS | Status: AC

## 2021-05-21 MED ORDER — DEXTROSE 50 % IV SOLN: 0.0000 mL | INTRAVENOUS | Status: DC | PRN

## 2021-05-21 MED ORDER — SODIUM BICARBONATE 8.4 % IV SOLN
50.0000 meq | Freq: Once | INTRAVENOUS | Status: AC
  Administered 2021-05-21: 50 meq via INTRAVENOUS

## 2021-05-21 MED ORDER — ROCURONIUM BROMIDE 10 MG/ML (PF) SYRINGE
PREFILLED_SYRINGE | INTRAVENOUS | Status: DC | PRN
  Administered 2021-05-21: 50 mg via INTRAVENOUS
  Administered 2021-05-21: 60 mg via INTRAVENOUS
  Administered 2021-05-21: 40 mg via INTRAVENOUS
  Administered 2021-05-21: 50 mg via INTRAVENOUS

## 2021-05-21 MED ORDER — MIDAZOLAM HCL 2 MG/2ML IJ SOLN: 2.0000 mg | INTRAMUSCULAR | Status: DC | PRN

## 2021-05-21 MED ORDER — 0.9 % SODIUM CHLORIDE (POUR BTL) OPTIME
TOPICAL | Status: DC | PRN
  Administered 2021-05-21: 6000 mL

## 2021-05-21 MED ORDER — AMIODARONE HCL IN DEXTROSE 360-4.14 MG/200ML-% IV SOLN
30.0000 mg/h | INTRAVENOUS | Status: DC
  Administered 2021-05-21: 30 mg/h via INTRAVENOUS
  Filled 2021-05-21: qty 200

## 2021-05-21 MED ORDER — CHLORHEXIDINE GLUCONATE 4 % EX LIQD: 30.0000 mL | CUTANEOUS | Status: DC

## 2021-05-21 MED ORDER — LACTATED RINGERS IV SOLN: INTRAVENOUS | Status: DC | PRN

## 2021-05-21 MED ORDER — METOPROLOL TARTRATE 25 MG/10 ML ORAL SUSPENSION: 12.5000 mg | Freq: Two times a day (BID) | ORAL | Status: DC

## 2021-05-21 MED ORDER — DEXMEDETOMIDINE HCL IN NACL 400 MCG/100ML IV SOLN
0.0000 ug/kg/h | INTRAVENOUS | Status: DC
Start: 2021-05-21 — End: 2021-05-22
  Administered 2021-05-21: 0.5 ug/kg/h via INTRAVENOUS
  Filled 2021-05-21: qty 100

## 2021-05-21 MED ORDER — AMLODIPINE BESYLATE 5 MG PO TABS: 5.0000 mg | ORAL_TABLET | Freq: Every day | ORAL | Status: DC

## 2021-05-21 MED ORDER — PROTAMINE SULFATE 10 MG/ML IV SOLN
INTRAVENOUS | Status: DC | PRN
  Administered 2021-05-21: 270 mg via INTRAVENOUS

## 2021-05-21 MED ORDER — MIDAZOLAM HCL (PF) 10 MG/2ML IJ SOLN
INTRAMUSCULAR | Status: AC
  Filled 2021-05-21: qty 2

## 2021-05-21 MED ORDER — ONDANSETRON HCL 4 MG/2ML IJ SOLN
4.0000 mg | Freq: Four times a day (QID) | INTRAMUSCULAR | Status: DC | PRN
  Administered 2021-05-21 – 2021-05-24 (×5): 4 mg via INTRAVENOUS
  Filled 2021-05-21 (×6): qty 2

## 2021-05-21 MED ORDER — ALBUTEROL SULFATE HFA 108 (90 BASE) MCG/ACT IN AERS
INHALATION_SPRAY | RESPIRATORY_TRACT | Status: DC | PRN
  Administered 2021-05-21 (×2): 4 via RESPIRATORY_TRACT

## 2021-05-21 MED ORDER — INSULIN REGULAR(HUMAN) IN NACL 100-0.9 UT/100ML-% IV SOLN
INTRAVENOUS | Status: DC
  Administered 2021-05-22: 2 [IU]/h via INTRAVENOUS
  Filled 2021-05-21: qty 100

## 2021-05-21 MED ORDER — ORAL CARE MOUTH RINSE
15.0000 mL | OROMUCOSAL | Status: DC
  Administered 2021-05-21: 15 mL via OROMUCOSAL

## 2021-05-21 MED ORDER — SODIUM CHLORIDE 0.9 % IV SOLN: INTRAVENOUS | Status: DC | PRN

## 2021-05-21 MED ORDER — PLASMA-LYTE A IV SOLN
INTRAVENOUS | Status: DC | PRN
  Administered 2021-05-21: 1000 mL via INTRAVASCULAR

## 2021-05-21 MED ORDER — LACTATED RINGERS IV SOLN: INTRAVENOUS | Status: DC

## 2021-05-21 MED ORDER — ONDANSETRON HCL 4 MG/2ML IJ SOLN
INTRAMUSCULAR | Status: DC | PRN
  Administered 2021-05-21: 4 mg via INTRAVENOUS

## 2021-05-21 MED ORDER — ASPIRIN EC 325 MG PO TBEC
325.0000 mg | DELAYED_RELEASE_TABLET | Freq: Every day | ORAL | Status: DC
  Filled 2021-05-21: qty 1

## 2021-05-21 MED ORDER — VARENICLINE TARTRATE 0.5 MG PO TABS
0.5000 mg | ORAL_TABLET | Freq: Every day | ORAL | Status: DC
  Administered 2021-05-22 – 2021-05-25 (×4): 0.5 mg via ORAL
  Filled 2021-05-21 (×6): qty 1

## 2021-05-21 MED ORDER — MORPHINE SULFATE (PF) 2 MG/ML IV SOLN
1.0000 mg | INTRAVENOUS | Status: DC | PRN
  Administered 2021-05-21 – 2021-05-22 (×6): 2 mg via INTRAVENOUS
  Filled 2021-05-21 (×7): qty 1

## 2021-05-21 MED ORDER — POTASSIUM CHLORIDE 10 MEQ/50ML IV SOLN
10.0000 meq | INTRAVENOUS | Status: AC
  Administered 2021-05-21 (×3): 10 meq via INTRAVENOUS
  Filled 2021-05-21 (×3): qty 50

## 2021-05-21 MED ORDER — DOBUTAMINE INFUSION FOR EP/ECHO/NUC (1000 MCG/ML)
0.0000 ug/kg/min | INTRAVENOUS | Status: DC
  Filled 2021-05-21: qty 250

## 2021-05-21 MED ORDER — DIPHENHYDRAMINE HCL 25 MG PO CAPS: 25.0000 mg | ORAL_CAPSULE | Freq: Four times a day (QID) | ORAL | Status: DC | PRN

## 2021-05-21 MED ORDER — FAMOTIDINE IN NACL 20-0.9 MG/50ML-% IV SOLN
20.0000 mg | Freq: Two times a day (BID) | INTRAVENOUS | Status: AC
  Administered 2021-05-21: 20 mg via INTRAVENOUS
  Filled 2021-05-21: qty 50

## 2021-05-21 MED ORDER — DOCUSATE SODIUM 100 MG PO CAPS
200.0000 mg | ORAL_CAPSULE | Freq: Every day | ORAL | Status: DC
  Filled 2021-05-21: qty 2

## 2021-05-21 MED ORDER — SODIUM CHLORIDE 0.45 % IV SOLN: INTRAVENOUS | Status: DC | PRN

## 2021-05-21 MED ORDER — SODIUM CHLORIDE 0.9 % IV SOLN: 250.0000 mL | INTRAVENOUS | Status: DC

## 2021-05-21 MED ORDER — CHLORHEXIDINE GLUCONATE CLOTH 2 % EX PADS
6.0000 | MEDICATED_PAD | Freq: Every day | CUTANEOUS | Status: DC
  Administered 2021-05-22: 6 via TOPICAL

## 2021-05-21 MED ORDER — FENTANYL CITRATE (PF) 250 MCG/5ML IJ SOLN
INTRAMUSCULAR | Status: AC
  Filled 2021-05-21: qty 25

## 2021-05-21 MED ORDER — ALBUTEROL SULFATE HFA 108 (90 BASE) MCG/ACT IN AERS
INHALATION_SPRAY | RESPIRATORY_TRACT | Status: DC | PRN
  Administered 2021-05-21 (×2): 6 via RESPIRATORY_TRACT

## 2021-05-21 MED ORDER — FENTANYL CITRATE (PF) 250 MCG/5ML IJ SOLN
INTRAMUSCULAR | Status: DC | PRN
  Administered 2021-05-21: 650 ug via INTRAVENOUS
  Administered 2021-05-21: 50 ug via INTRAVENOUS
  Administered 2021-05-21: 100 ug via INTRAVENOUS
  Administered 2021-05-21: 50 ug via INTRAVENOUS
  Administered 2021-05-21: 100 ug via INTRAVENOUS
  Administered 2021-05-21: 150 ug via INTRAVENOUS
  Administered 2021-05-21: 100 ug via INTRAVENOUS

## 2021-05-21 MED ORDER — MAGNESIUM SULFATE 4 GM/100ML IV SOLN
4.0000 g | Freq: Once | INTRAVENOUS | Status: AC
  Administered 2021-05-21: 4 g via INTRAVENOUS
  Filled 2021-05-21: qty 100

## 2021-05-21 MED ORDER — TRAMADOL HCL 50 MG PO TABS
50.0000 mg | ORAL_TABLET | ORAL | Status: DC | PRN
  Administered 2021-05-21 – 2021-05-22 (×3): 100 mg via ORAL
  Administered 2021-05-23 (×2): 50 mg via ORAL
  Administered 2021-05-23 – 2021-05-24 (×2): 100 mg via ORAL
  Filled 2021-05-21: qty 2
  Filled 2021-05-21: qty 1
  Filled 2021-05-21: qty 2
  Filled 2021-05-21: qty 1
  Filled 2021-05-21 (×3): qty 2

## 2021-05-21 MED ORDER — LACTATED RINGERS IV SOLN: 500.0000 mL | Freq: Once | INTRAVENOUS | Status: DC | PRN

## 2021-05-21 MED ORDER — SODIUM CHLORIDE 0.9 % IV BOLUS
250.0000 mL | INTRAVENOUS | Status: DC | PRN
  Administered 2021-05-21 (×3): 250 mL via INTRAVENOUS

## 2021-05-21 MED ORDER — SODIUM CHLORIDE 0.9% FLUSH
3.0000 mL | Freq: Two times a day (BID) | INTRAVENOUS | Status: DC
  Administered 2021-05-22 – 2021-05-26 (×8): 3 mL via INTRAVENOUS

## 2021-05-21 MED ORDER — ACETAMINOPHEN 160 MG/5ML PO SOLN
1000.0000 mg | Freq: Four times a day (QID) | ORAL | Status: DC
  Administered 2021-05-22 (×3): 1000 mg
  Filled 2021-05-21 (×3): qty 40.6

## 2021-05-21 MED ORDER — CEFAZOLIN SODIUM-DEXTROSE 2-4 GM/100ML-% IV SOLN
2.0000 g | Freq: Three times a day (TID) | INTRAVENOUS | Status: AC
  Administered 2021-05-21 – 2021-05-23 (×6): 2 g via INTRAVENOUS
  Filled 2021-05-21 (×6): qty 100

## 2021-05-21 MED ORDER — LEVALBUTEROL HCL 0.63 MG/3ML IN NEBU
0.6300 mg | INHALATION_SOLUTION | Freq: Three times a day (TID) | RESPIRATORY_TRACT | Status: DC | PRN
  Administered 2021-05-21 – 2021-05-23 (×5): 0.63 mg via RESPIRATORY_TRACT
  Filled 2021-05-21 (×5): qty 3

## 2021-05-21 MED ORDER — NICARDIPINE HCL IN NACL 20-0.86 MG/200ML-% IV SOLN
3.0000 mg/h | INTRAVENOUS | Status: DC
  Administered 2021-05-21 – 2021-05-22 (×2): 2.5 mg/h via INTRAVENOUS
  Filled 2021-05-21 (×2): qty 200

## 2021-05-21 MED ORDER — HEPARIN SODIUM (PORCINE) 1000 UNIT/ML IJ SOLN
INTRAMUSCULAR | Status: DC | PRN
  Administered 2021-05-21: 27000 [IU] via INTRAVENOUS

## 2021-05-21 MED ORDER — PANTOPRAZOLE SODIUM 40 MG PO TBEC
40.0000 mg | DELAYED_RELEASE_TABLET | Freq: Every day | ORAL | Status: DC
  Administered 2021-05-23 – 2021-05-26 (×4): 40 mg via ORAL
  Filled 2021-05-21 (×4): qty 1

## 2021-05-21 MED ORDER — SODIUM CHLORIDE 0.9% FLUSH: 3.0000 mL | INTRAVENOUS | Status: DC | PRN

## 2021-05-21 MED ORDER — ~~LOC~~ CARDIAC SURGERY, PATIENT & FAMILY EDUCATION
Freq: Once | Status: DC
  Filled 2021-05-21: qty 1

## 2021-05-21 MED ORDER — SODIUM CHLORIDE 0.9 % IV SOLN: INTRAVENOUS | Status: DC

## 2021-05-21 MED ORDER — ALBUMIN HUMAN 5 % IV SOLN: 250.0000 mL | INTRAVENOUS | Status: DC | PRN

## 2021-05-21 MED ORDER — PROPOFOL 10 MG/ML IV BOLUS
INTRAVENOUS | Status: AC
  Filled 2021-05-21: qty 20

## 2021-05-21 MED ORDER — ACETAMINOPHEN 500 MG PO TABS
1000.0000 mg | ORAL_TABLET | Freq: Four times a day (QID) | ORAL | Status: DC
  Administered 2021-05-22 – 2021-05-25 (×8): 1000 mg via ORAL
  Filled 2021-05-21 (×10): qty 2

## 2021-05-21 MED ORDER — ROSUVASTATIN CALCIUM 5 MG PO TABS: 10.0000 mg | ORAL_TABLET | Freq: Every day | ORAL | Status: DC

## 2021-05-21 MED ORDER — METOPROLOL TARTRATE 5 MG/5ML IV SOLN: 2.5000 mg | INTRAVENOUS | Status: DC | PRN

## 2021-05-21 MED ORDER — ORAL CARE MOUTH RINSE
15.0000 mL | Freq: Two times a day (BID) | OROMUCOSAL | Status: DC
  Administered 2021-05-22 – 2021-05-26 (×8): 15 mL via OROMUCOSAL

## 2021-05-21 MED ORDER — ORAL CARE MOUTH RINSE
15.0000 mL | Freq: Once | OROMUCOSAL | Status: AC
Start: 2021-05-21 — End: 2021-05-21

## 2021-05-21 MED ORDER — AMIODARONE HCL IN DEXTROSE 360-4.14 MG/200ML-% IV SOLN
INTRAVENOUS | Status: DC | PRN
  Administered 2021-05-21: 30 mg/h via INTRAVENOUS

## 2021-05-21 MED ORDER — UMECLIDINIUM BROMIDE 62.5 MCG/INH IN AEPB
1.0000 | INHALATION_SPRAY | Freq: Every day | RESPIRATORY_TRACT | Status: DC
  Administered 2021-05-22 – 2021-05-26 (×3): 1 via RESPIRATORY_TRACT
  Filled 2021-05-21 (×2): qty 7

## 2021-05-21 MED ORDER — CHLORHEXIDINE GLUCONATE 0.12 % MT SOLN
15.0000 mL | OROMUCOSAL | Status: AC
  Administered 2021-05-21: 15 mL via OROMUCOSAL

## 2021-05-21 MED ORDER — MIDAZOLAM HCL (PF) 5 MG/ML IJ SOLN
INTRAMUSCULAR | Status: DC | PRN
  Administered 2021-05-21 (×4): 2 mg via INTRAVENOUS

## 2021-05-21 MED ORDER — PHENYLEPHRINE 40 MCG/ML (10ML) SYRINGE FOR IV PUSH (FOR BLOOD PRESSURE SUPPORT)
PREFILLED_SYRINGE | INTRAVENOUS | Status: DC | PRN
  Administered 2021-05-21: 20 ug via INTRAVENOUS
  Administered 2021-05-21: 40 ug via INTRAVENOUS
  Administered 2021-05-21: 20 ug via INTRAVENOUS
  Administered 2021-05-21: 40 ug via INTRAVENOUS
  Administered 2021-05-21 (×5): 20 ug via INTRAVENOUS
  Administered 2021-05-21: 40 ug via INTRAVENOUS
  Administered 2021-05-21 (×2): 20 ug via INTRAVENOUS
  Administered 2021-05-21: 40 ug via INTRAVENOUS

## 2021-05-21 MED ORDER — CHLORHEXIDINE GLUCONATE 0.12% ORAL RINSE (MEDLINE KIT)
15.0000 mL | Freq: Two times a day (BID) | OROMUCOSAL | Status: DC
  Administered 2021-05-21: 15 mL via OROMUCOSAL

## 2021-05-21 MED ORDER — BISACODYL 10 MG RE SUPP: 10.0000 mg | Freq: Every day | RECTAL | Status: DC

## 2021-05-21 MED ORDER — METOPROLOL TARTRATE 12.5 MG HALF TABLET
12.5000 mg | ORAL_TABLET | Freq: Once | ORAL | Status: AC
  Administered 2021-05-21: 12.5 mg via ORAL
  Filled 2021-05-21: qty 1

## 2021-05-21 MED ORDER — DOBUTAMINE IN D5W 4-5 MG/ML-% IV SOLN
0.0000 ug/kg/min | INTRAVENOUS | Status: DC
  Administered 2021-05-21: 2.5 ug/kg/min via INTRAVENOUS
  Filled 2021-05-21: qty 250

## 2021-05-21 MED ORDER — ALBUMIN HUMAN 5 % IV SOLN: INTRAVENOUS | Status: DC | PRN

## 2021-05-21 MED ORDER — ACETAMINOPHEN 160 MG/5ML PO SOLN: 650.0000 mg | Freq: Once | ORAL | Status: AC

## 2021-05-21 MED ORDER — PROPOFOL 10 MG/ML IV BOLUS
INTRAVENOUS | Status: DC | PRN
  Administered 2021-05-21: 30 mg via INTRAVENOUS
  Administered 2021-05-21 (×2): 20 mg via INTRAVENOUS

## 2021-05-21 MED ORDER — ALBUMIN HUMAN 25 % IV SOLN
12.5000 g | INTRAVENOUS | Status: DC | PRN
Start: 2021-05-21 — End: 2021-05-26
  Administered 2021-05-21 (×3): 12.5 g via INTRAVENOUS
  Filled 2021-05-21: qty 50

## 2021-05-21 MED ORDER — PHENYLEPHRINE HCL-NACL 20-0.9 MG/250ML-% IV SOLN
0.0000 ug/min | INTRAVENOUS | Status: DC
  Administered 2021-05-21: 80 ug/min via INTRAVENOUS
  Administered 2021-05-22: 100 ug/min via INTRAVENOUS
  Administered 2021-05-22: 110 ug/min via INTRAVENOUS
  Administered 2021-05-22: 100 ug/min via INTRAVENOUS
  Filled 2021-05-21 (×4): qty 250

## 2021-05-21 MED ORDER — CHLORHEXIDINE GLUCONATE 0.12 % MT SOLN: 15.0000 mL | Freq: Once | OROMUCOSAL | Status: DC

## 2021-05-21 MED ORDER — BISACODYL 5 MG PO TBEC
10.0000 mg | DELAYED_RELEASE_TABLET | Freq: Every day | ORAL | Status: DC
  Administered 2021-05-23 – 2021-05-24 (×2): 10 mg via ORAL
  Filled 2021-05-21 (×3): qty 2

## 2021-05-21 MED ORDER — METOPROLOL TARTRATE 12.5 MG HALF TABLET
12.5000 mg | ORAL_TABLET | Freq: Two times a day (BID) | ORAL | Status: DC
  Administered 2021-05-22 – 2021-05-25 (×6): 12.5 mg via ORAL
  Filled 2021-05-21 (×7): qty 1

## 2021-05-21 MED ORDER — ROCURONIUM BROMIDE 10 MG/ML (PF) SYRINGE
PREFILLED_SYRINGE | INTRAVENOUS | Status: AC
  Filled 2021-05-21: qty 10

## 2021-05-21 MED ORDER — VANCOMYCIN HCL IN DEXTROSE 1-5 GM/200ML-% IV SOLN
1000.0000 mg | Freq: Once | INTRAVENOUS | Status: AC
  Administered 2021-05-21: 1000 mg via INTRAVENOUS
  Filled 2021-05-21: qty 200

## 2021-05-21 MED ORDER — CHLORHEXIDINE GLUCONATE 0.12 % MT SOLN
15.0000 mL | Freq: Once | OROMUCOSAL | Status: AC
  Administered 2021-05-21: 15 mL via OROMUCOSAL
  Filled 2021-05-21: qty 15

## 2021-05-21 MED ORDER — ACETAMINOPHEN 650 MG RE SUPP
650.0000 mg | Freq: Once | RECTAL | Status: AC
  Administered 2021-05-21: 650 mg via RECTAL

## 2021-05-21 MED ORDER — SODIUM CHLORIDE (PF) 0.9 % IJ SOLN
OROMUCOSAL | Status: DC | PRN
  Administered 2021-05-21 (×2): 4 mL via TOPICAL

## 2021-05-21 MED ORDER — ASPIRIN 81 MG PO CHEW: 324.0000 mg | CHEWABLE_TABLET | Freq: Every day | ORAL | Status: DC

## 2021-05-21 SURGICAL SUPPLY — 96 items
ADH SKN CLS APL DERMABOND .7 (GAUZE/BANDAGES/DRESSINGS) ×8
BAG DECANTER FOR FLEXI CONT (MISCELLANEOUS) ×5 IMPLANT
BLADE CLIPPER SURG (BLADE) ×9 IMPLANT
BLADE STERNUM SYSTEM 6 (BLADE) ×5 IMPLANT
BLADE SURG 11 STRL SS (BLADE) ×1 IMPLANT
BLADE SURG 15 STRL LF DISP TIS (BLADE) ×4 IMPLANT
BLADE SURG 15 STRL SS (BLADE) ×5
BNDG ELASTIC 4X5.8 VLCR STR LF (GAUZE/BANDAGES/DRESSINGS) ×10 IMPLANT
BNDG ELASTIC 6X5.8 VLCR STR LF (GAUZE/BANDAGES/DRESSINGS) ×5 IMPLANT
BNDG GAUZE ELAST 4 BULKY (GAUZE/BANDAGES/DRESSINGS) ×6 IMPLANT
CANISTER SUCT 3000ML PPV (MISCELLANEOUS) ×5 IMPLANT
CANNULA ARTERIAL VENT 3/8 20FR (CANNULA) ×1 IMPLANT
CANNULA EZ GLIDE AORTIC 21FR (CANNULA) ×1 IMPLANT
CANNULA MC2 2 STG 29/37 NON-V (CANNULA) ×4 IMPLANT
CANNULA MC2 TWO STAGE (CANNULA) ×5
CANNULA NON VENT 20FR 12 (CANNULA) ×4 IMPLANT
CATH ROBINSON RED A/P 18FR (CATHETERS) ×10 IMPLANT
CLIP TI MEDIUM 24 (CLIP) ×3 IMPLANT
CLIP TI WIDE RED SMALL 24 (CLIP) ×3 IMPLANT
CONNECTOR BLAKE 2:1 CARIO BLK (MISCELLANEOUS) ×5 IMPLANT
CONTAINER PROTECT SURGISLUSH (MISCELLANEOUS) ×6 IMPLANT
COVER MAYO STAND STRL (DRAPES) ×5 IMPLANT
DERMABOND ADVANCED (GAUZE/BANDAGES/DRESSINGS) ×2
DERMABOND ADVANCED .7 DNX12 (GAUZE/BANDAGES/DRESSINGS) IMPLANT
DRAIN CHANNEL 19F RND (DRAIN) ×15 IMPLANT
DRAIN CONNECTOR BLAKE 1:1 (MISCELLANEOUS) ×5 IMPLANT
DRAPE CARDIOVASCULAR INCISE (DRAPES) ×5
DRAPE EXTREMITY T 121X128X90 (DISPOSABLE) ×5 IMPLANT
DRAPE HALF SHEET 40X57 (DRAPES) ×5 IMPLANT
DRAPE INCISE IOBAN 66X45 STRL (DRAPES) IMPLANT
DRAPE SRG 135X102X78XABS (DRAPES) ×4 IMPLANT
DRAPE WARM FLUID 44X44 (DRAPES) ×5 IMPLANT
DRESSING PEEL AND PLAC PRVNA20 (GAUZE/BANDAGES/DRESSINGS) IMPLANT
DRSG PEEL AND PLACE PREVENA 20 (GAUZE/BANDAGES/DRESSINGS) ×5
ELECT BLADE 4.0 EZ CLEAN MEGAD (MISCELLANEOUS) ×5
ELECT REM PT RETURN 9FT ADLT (ELECTROSURGICAL) ×10
ELECTRODE BLDE 4.0 EZ CLN MEGD (MISCELLANEOUS) ×4 IMPLANT
ELECTRODE REM PT RTRN 9FT ADLT (ELECTROSURGICAL) ×8 IMPLANT
FELT TEFLON 1X6 (MISCELLANEOUS) ×9 IMPLANT
GAUZE SPONGE 4X4 12PLY STRL (GAUZE/BANDAGES/DRESSINGS) ×10 IMPLANT
GEL ULTRASOUND 20GR AQUASONIC (MISCELLANEOUS) ×5 IMPLANT
GLOVE SURG ENC MOIS LTX SZ7 (GLOVE) ×10 IMPLANT
GLOVE SURG ENC TEXT LTX SZ7.5 (GLOVE) ×10 IMPLANT
GOWN STRL REUS W/ TWL LRG LVL3 (GOWN DISPOSABLE) ×16 IMPLANT
GOWN STRL REUS W/ TWL XL LVL3 (GOWN DISPOSABLE) ×8 IMPLANT
GOWN STRL REUS W/TWL LRG LVL3 (GOWN DISPOSABLE) ×20
GOWN STRL REUS W/TWL XL LVL3 (GOWN DISPOSABLE) ×10
HEMOSTAT POWDER SURGIFOAM 1G (HEMOSTASIS) ×14 IMPLANT
INSERT SUTURE HOLDER (MISCELLANEOUS) ×5 IMPLANT
KIT BASIN OR (CUSTOM PROCEDURE TRAY) ×5 IMPLANT
KIT SUCTION CATH 14FR (SUCTIONS) ×5 IMPLANT
KIT TURNOVER KIT B (KITS) ×10 IMPLANT
KIT VASOVIEW HEMOPRO 2 VH 4000 (KITS) ×5 IMPLANT
LEAD PACING MYOCARDI (MISCELLANEOUS) ×5 IMPLANT
MARKER GRAFT CORONARY BYPASS (MISCELLANEOUS) ×15 IMPLANT
NS IRRIG 1000ML POUR BTL (IV SOLUTION) ×26 IMPLANT
PACK ACCESSORY CANNULA KIT (KITS) ×5 IMPLANT
PACK E OPEN HEART (SUTURE) ×5 IMPLANT
PACK OPEN HEART (CUSTOM PROCEDURE TRAY) ×5 IMPLANT
PAD ARMBOARD 7.5X6 YLW CONV (MISCELLANEOUS) ×20 IMPLANT
PAD ELECT DEFIB RADIOL ZOLL (MISCELLANEOUS) ×5 IMPLANT
PENCIL BUTTON HOLSTER BLD 10FT (ELECTRODE) ×5 IMPLANT
POSITIONER HEAD DONUT 9IN (MISCELLANEOUS) ×5 IMPLANT
PUNCH AORTIC ROTATE 4.0MM (MISCELLANEOUS) ×5 IMPLANT
SET MPS 3-ND DEL (MISCELLANEOUS) ×1 IMPLANT
SHEARS HARMONIC 9CM CVD (BLADE) ×5 IMPLANT
SOL ANTI FOG 6CC (MISCELLANEOUS) IMPLANT
SOLUTION ANTI FOG 6CC (MISCELLANEOUS) ×1
SUPPORT HEART JANKE-BARRON (MISCELLANEOUS) ×5 IMPLANT
SUT BONE WAX W31G (SUTURE) ×5 IMPLANT
SUT ETHIBOND X763 2 0 SH 1 (SUTURE) ×10 IMPLANT
SUT MNCRL AB 3-0 PS2 18 (SUTURE) ×10 IMPLANT
SUT MNCRL AB 4-0 PS2 18 (SUTURE) ×2 IMPLANT
SUT PDS AB 1 CTX 36 (SUTURE) ×10 IMPLANT
SUT PROLENE 4 0 RB 1 (SUTURE) ×5
SUT PROLENE 4 0 SH DA (SUTURE) ×7 IMPLANT
SUT PROLENE 4-0 RB1 .5 CRCL 36 (SUTURE) IMPLANT
SUT PROLENE 5 0 C 1 36 (SUTURE) ×17 IMPLANT
SUT PROLENE 7 0 BV 1 (SUTURE) ×4 IMPLANT
SUT PROLENE 7 0 BV1 MDA (SUTURE) ×6 IMPLANT
SUT STEEL 6MS V (SUTURE) ×10 IMPLANT
SUT VIC AB 2-0 CT1 27 (SUTURE) ×10
SUT VIC AB 2-0 CT1 TAPERPNT 27 (SUTURE) IMPLANT
SUT VIC AB 3-0 SH 27 (SUTURE)
SUT VIC AB 3-0 SH 27X BRD (SUTURE) IMPLANT
SUT VIC AB 3-0 X1 27 (SUTURE) IMPLANT
SYR 50ML SLIP (SYRINGE) IMPLANT
SYSTEM SAHARA CHEST DRAIN ATS (WOUND CARE) ×5 IMPLANT
TAPE CLOTH 4X10 WHT NS (GAUZE/BANDAGES/DRESSINGS) ×1 IMPLANT
TAPE PAPER 2X10 WHT MICROPORE (GAUZE/BANDAGES/DRESSINGS) ×1 IMPLANT
TOWEL GREEN STERILE (TOWEL DISPOSABLE) ×10 IMPLANT
TOWEL GREEN STERILE FF (TOWEL DISPOSABLE) ×10 IMPLANT
TRAY FOLEY SLVR 14FR TEMP STAT (SET/KITS/TRAYS/PACK) ×1 IMPLANT
TUBING LAP HI FLOW INSUFFLATIO (TUBING) ×5 IMPLANT
UNDERPAD 30X36 HEAVY ABSORB (UNDERPADS AND DIAPERS) ×10 IMPLANT
WATER STERILE IRR 1000ML POUR (IV SOLUTION) ×10 IMPLANT

## 2021-05-21 NOTE — Discharge Instructions (Signed)

## 2021-05-21 NOTE — Progress Notes (Signed)
Inpatient Diabetes Program Recommendations  AACE/ADA: New Consensus Statement on Inpatient Glycemic Control (2015)  Target Ranges:  Prepandial:   less than 140 mg/dL      Peak postprandial:   less than 180 mg/dL (1-2 hours)      Critically ill patients:  140 - 180 mg/dL   Lab Results  Component Value Date   GLUCAP 123 (H) 05/21/2021   HGBA1C 10.3 (H) 05/17/2021    Review of Glycemic Control Results for CLYTIE, SHETLEY (MRN [id number]) as of 05/21/2021 15:24  Ref. Range 05/21/2021 05:56 05/21/2021 13:52 05/21/2021 15:08  Glucose-Capillary Latest Ref Range: 70 - 99 mg/dL 258 (H) 135 (H) 123 (H)   Diabetes history: DM 2 Outpatient Diabetes medications:  Glucotrol 5 mg bid, Januvia 100 mg daily Current orders for Inpatient glycemic control:  IV insulin  Inpatient Diabetes Program Recommendations:    Consult received. Will follow.  May need insulin at d/c based on A1C.   Patient is currently on IV insulin.   Thanks,  Adah Perl, RN, BC-ADM Inpatient Diabetes Coordinator Pager [phone]

## 2021-05-21 NOTE — Progress Notes (Signed)
Patient ID: Lauren David, female   DOB: Nov 06, 1960, 60 y.o.   MRN: [id number]  [facility] Evening Rounds:   Hemodynamically stable on dobut 2.5. CI = 3.2  Weaning on vent. Ready for extubation.  Urine output good  CT output low  CBC    Component Value Date/Time   WBC 21.1 (H) 05/21/2021 1352   RBC 3.82 (L) 05/21/2021 1352   HGB 10.9 (L) 05/21/2021 1354   HGB 14.6 01/24/2021 0937   HCT 32.0 (L) 05/21/2021 1354   HCT 44.9 01/24/2021 0937   PLT 201 05/21/2021 1352   PLT 279 01/24/2021 0937   MCV 90.8 05/21/2021 1352   MCV 88 01/24/2021 0937   MCH 29.6 05/21/2021 1352   MCHC 32.6 05/21/2021 1352   RDW 12.3 05/21/2021 1352   RDW 11.9 01/24/2021 0937   LYMPHSABS 3.1 01/24/2021 0937   EOSABS 0.4 01/24/2021 0937   BASOSABS 0.1 01/24/2021 0937     BMET    Component Value Date/Time   NA 142 05/21/2021 1354   NA 139 01/24/2021 0937   K 4.0 05/21/2021 1354   CL 107 05/21/2021 1226   CO2 23 05/17/2021 1500   GLUCOSE 134 (H) 05/21/2021 1226   BUN 4 (L) 05/21/2021 1226   BUN 7 (L) 01/24/2021 0937   CREATININE 0.20 (L) 05/21/2021 1226   CREATININE 0.61 05/06/2018 1255   CALCIUM 9.2 05/17/2021 1500   EGFR 100 01/24/2021 0937   GFRNONAA >60 05/17/2021 1500     A/P:  Stable postop course. Continue current plans

## 2021-05-21 NOTE — Interval H&P Note (Signed)
History and Physical Interval Note:  05/21/2021 7:17 AM  Lauren David  has presented today for surgery, with the diagnosis of CAD.  The various methods of treatment have been discussed with the patient and family. After consideration of risks, benefits and other options for treatment, the patient has consented to  Procedure(s): CORONARY ARTERY BYPASS GRAFTING (CABG) (N/A) RADIAL ARTERY HARVEST (Left) TRANSESOPHAGEAL ECHOCARDIOGRAM (TEE) (N/A) as a surgical intervention.  The patient's history has been reviewed, patient examined, no change in status, stable for surgery.  I have reviewed the patient's chart and labs.  Questions were answered to the patient's satisfaction.     Kalayla Shadden Bary Leriche

## 2021-05-21 NOTE — Procedures (Signed)
Extubation Procedure Note  Patient Details:   Name: Lauren David DOB: 07-31-1961 MRN: [id number]   Airway Documentation:    Vent end date: 05/21/21 Vent end time: 1848   Evaluation  O2 sats: currently acceptable Complications: No apparent complications Patient did tolerate procedure well. Bilateral Breath Sounds: Coarse crackles, wheezes   Patient extubated per Rapid wean protocol & placed on 4L [place] then 6L [place]. Patient passed weaning parameters NIF -25, VC 750, positive cuff leak. Patient able to speak & cough. IS instructed.  Kathie Dike 05/21/2021, 6:55 PM

## 2021-05-21 NOTE — Anesthesia Procedure Notes (Signed)
Arterial Line Insertion Start/End10/18/2022 7:00 AM, 05/21/2021 7:05 AM Performed by: Annye Asa, MD, Imagene Riches, CRNA, CRNA  Preanesthetic checklist: patient identified, IV checked, site marked, risks and benefits discussed, surgical consent, monitors and equipment checked, pre-op evaluation, timeout performed and anesthesia consent Lidocaine 1% used for infiltration and patient sedated Right, radial was placed Catheter size: 20 G Hand hygiene performed  and Seldinger technique used  Attempts: 1 Procedure performed without using ultrasound guided technique. Following insertion, dressing applied and Biopatch. Post procedure assessment: normal  Patient tolerated the procedure well with no immediate complications. Additional procedure comments: Placed by Lysle Dingwall Timcheck.

## 2021-05-21 NOTE — Progress Notes (Signed)
Dr. Glennon Mac made aware patient's CBG= 258 this morning. No new orders received at this time.

## 2021-05-21 NOTE — Anesthesia Postprocedure Evaluation (Signed)
Anesthesia Post Note  Patient: Lauren David  Procedure(s) Performed: CORONARY ARTERY BYPASS GRAFTING (CABG) X4 ON PUMP USING LEFT INTERNAL MAMMARY ARTERY AND ENDOSCOPICALLY HARVESTED RIGHT GREATER SAPHENOUS VEIN (Chest) RADIAL ARTERY HARVEST (Left: Arm Lower) TRANSESOPHAGEAL ECHOCARDIOGRAM (TEE) APPLICATION OF CELL SAVER ENDOVEIN HARVEST OF GREATER SAPHENOUS VEIN (Right)     Patient location during evaluation: SICU Anesthesia Type: General Level of consciousness: sedated and patient remains intubated per anesthesia plan Pain management: pain level controlled Vital Signs Assessment: post-procedure vital signs reviewed and stable Respiratory status: patient remains intubated per anesthesia plan and patient on ventilator - see flowsheet for VS (O2 sat 98%, remains on vent and not weaning yet) Cardiovascular status: blood pressure returned to baseline and stable Postop Assessment: no apparent nausea or vomiting Anesthetic complications: no   No notable events documented.  Last Vitals:  Vitals:   05/21/21 1645 05/21/21 1700  BP:    Pulse: 95 91  Resp: (!) 21 16  Temp: 37.3 C 37.3 C  SpO2: 98% 99%    Last Pain:  Vitals:   05/21/21 0603  TempSrc:   PainSc: 0-No pain                 Haylee Mcanany,E. Analya Louissaint

## 2021-05-21 NOTE — Anesthesia Procedure Notes (Signed)
Central Venous Catheter Insertion Performed by: Annye Asa, MD, anesthesiologist Start/End10/18/2022 6:48 AM, 05/21/2021 7:01 AM Patient location: Pre-op. Preanesthetic checklist: patient identified, IV checked, site marked, risks and benefits discussed, surgical consent, monitors and equipment checked, pre-op evaluation, timeout performed and anesthesia consent Position: supine Lidocaine 1% used for infiltration and patient sedated Hand hygiene performed , maximum sterile barriers used  and Seldinger technique used Catheter size: 8.5 Fr Central line was placed.Sheath introducer Procedure performed using ultrasound guided technique. Ultrasound Notes:anatomy identified, needle tip was noted to be adjacent to the nerve/plexus identified, no ultrasound evidence of intravascular and/or intraneural injection and image(s) printed for medical record Attempts: 1 Following insertion, line sutured, dressing applied and Biopatch. Post procedure assessment: blood return through all ports, free fluid flow and no air  Patient tolerated the procedure well with no immediate complications. Additional procedure comments: CVP: Timeout, sterile prep, drape, FBP R neck.  Supine position.  1% lido local, finder and trocar RIJ 1st pass with US guidance.  Cordis introducer placed over J wire. 3-lumen catheter placed through cordis. Biopatch and sterile dressing on.  Patient tolerated well.  VSS.  Jenita Seashore, MD .

## 2021-05-21 NOTE — Plan of Care (Signed)

## 2021-05-21 NOTE — Transfer of Care (Signed)
Immediate Anesthesia Transfer of Care Note  Patient: Lauren David  Procedure(s) Performed: CORONARY ARTERY BYPASS GRAFTING (CABG) X4 ON PUMP USING LEFT INTERNAL MAMMARY ARTERY AND ENDOSCOPICALLY HARVESTED RIGHT GREATER SAPHENOUS VEIN (Chest) RADIAL ARTERY HARVEST (Left: Arm Lower) TRANSESOPHAGEAL ECHOCARDIOGRAM (TEE) APPLICATION OF CELL SAVER ENDOVEIN HARVEST OF GREATER SAPHENOUS VEIN (Right)  Patient Location: ICU  Anesthesia Type:General  Level of Consciousness: sedated and Patient remains intubated per anesthesia plan  Airway & Oxygen Therapy: Patient remains intubated per anesthesia plan and Patient placed on Ventilator (see vital sign flow sheet for setting)  Post-op Assessment: Report given to RN and Post -op Vital signs reviewed and stable  Post vital signs: Reviewed and stable  Last Vitals:  Vitals Value Taken Time  BP    Temp    Pulse 92 05/21/21 1340  Resp 30 05/21/21 1340  SpO2 99 % 05/21/21 1340  Vitals shown include unvalidated device data.  Last Pain:  Vitals:   05/21/21 0603  TempSrc:   PainSc: 0-No pain      Patients Stated Pain Goal: 2 (90/90/30 1499)  Complications: No notable events documented.

## 2021-05-21 NOTE — Op Note (Signed)
[address]       [place] [address]             [phone]                                          05/21/2021 Patient:  Lauren David Pre-Op Dx: Three-vessel coronary artery disease   Diabetes mellitus   Hypertension Post-op Dx: Same Procedure: CABG X 4, LIMA LAD, left radial artery to diagonal branch, reverse saphenous vein graft to PDA, reverse saphenous vein graft to obtuse marginal Endoscopic greater saphenous vein harvest on the right Open left radial artery harvest   Surgeon and Role:      * Lulie Hurd, Lucile Crater, MD - Primary    *D. Tacy Dura, PA-C- assisting Assistant: Leretha Pol, PA-C  Anesthesia  general EBL: 700 ml Blood Administration: None   Drains: 50 F blake drain: R, L, mediastinal  Wires: Ventricular Counts: correct   Indications: 60 year old female with severe three-vessel coronary artery disease.  She also has poorly controlled diabetes.  She continues to smoke a pack and a half of cigarettes daily.  I given her prescription for Chantix to assist with smoking cessation.  She is agreeable to proceed with surgical revascularization.  She will require bypass to the LAD, PDA, and to obtuse marginals.  She is right-handed thus we will harvest the left radial artery  Findings: Very small PDA.  The obtuse marginal was heavily calcified.  The second obtuse marginal was too small for bypass.  The diagonal branch was heavily calcified but covered a good territory.  The radial artery was good quality.  The vein was good conduit.  The LIMA was also a good conduit.  Operative Technique: All invasive lines were placed in pre-op holding.  After the risks, benefits and alternatives were thoroughly discussed, the patient was brought to the operative theatre.  Anesthesia was induced, and the patient was prepped and draped in normal sterile fashion.  An appropriate surgical pause was performed, and pre-operative antibiotics were dosed  accordingly.  We began with simultaneous incisions along the right leg for harvesting of the greater saphenous vein, the left arm for harvesting of the left radial artery and the chest for the sternotomy.  In regards to the sternotomy, this was carried down with bovie cautery, and the sternum was divided with a reciprocating saw.  Meticulous hemostasis was obtained.  The left internal thoracic artery was exposed and harvested in in pedicled fashion.  The patient was systemically heparinized, and the artery was divided distally, and placed in a papaverine sponge.    The sternal elevator was removed, and a retractor was placed.  The pericardium was divided in the midline and fashioned into a cradle with pericardial stitches.   After we confirmed an appropriate ACT, the ascending aorta was cannulated in standard fashion.  The right atrial appendage was used for venous cannulation site.  Cardiopulmonary bypass was initiated, and the heart retractor was placed. The cross clamp was applied, and a dose of anterograde cardioplegia was given with good arrest of the heart.  We moved to the posterior wall of the heart, and found a good target on the PDA.  An arteriotomy was made, and the vein graft was anastomosed to it in an end to side fashion.  Next we exposed the lateral wall, and found a good  target on the obtuse marginal.  An end to side anastomosis with the vein graft was then created.  Next, we exposed the anterior wall of the heart and identified a good target on diagonal.   An arteriotomy was created.  The radial artery was anastomosed in an end to side fashion.  Finally, we exposed a good target on the LAD, and fashioned an end to side anastomosis between it and the LITA.  We began to re-warm, and a re-animation dose of cardioplegia was given.  The heart was de-aired, and the cross clamp was removed.  Meticulous hemostasis was obtained.    A partial occludding clamp was then placed on the ascending aorta, and  we created an end to side anastomosis between it and the proximal vein grafts.  The proximal sites were marked with rings.  The proximal end of the radial artery was jumped off of the obtuse marginal vein graft.  Hemostasis was obtained, and we separated from cardiopulmonary bypass without event.  The heparin was reversed with protamine.  Chest tubes and wires were placed, and the sternum was re-approximated with sternal wires.  The soft tissue and skin were re-approximated wth absorbable suture.    The patient tolerated the procedure without any immediate complications, and was transferred to the ICU in guarded condition.  Jamilett Ferrante Bary Leriche

## 2021-05-21 NOTE — Anesthesia Procedure Notes (Addendum)
Procedure Name: Intubation Date/Time: 05/21/2021 7:53 AM Performed by: Imagene Riches, CRNA Pre-anesthesia Checklist: Patient identified, Emergency Drugs available, Suction available and Patient being monitored Patient Re-evaluated:Patient Re-evaluated prior to induction Oxygen Delivery Method: Circle System Utilized Preoxygenation: Pre-oxygenation with 100% oxygen Induction Type: IV induction Ventilation: Mask ventilation without difficulty Laryngoscope Size: Mac and 3 Grade View: Grade I Tube type: Oral Tube size: 8.0 mm Number of attempts: 1 Airway Equipment and Method: Stylet and Oral airway Placement Confirmation: ETT inserted through vocal cords under direct vision, positive ETCO2 and breath sounds checked- equal and bilateral Secured at: 24 cm Tube secured with: Tape Dental Injury: Teeth and Oropharynx as per pre-operative assessment  Comments: Placed by Orland Mustard SRNA

## 2021-05-21 NOTE — Hospital Course (Addendum)
HPI: This is a 60 year old female presents for surgical evaluation of  coronary artery disease.  She has a history of diabetes Poorly controlled), and continues to smoke about a pack and a half of cigarettes a day.  She has been having some exertional anginal symptoms as well as some symptoms at rest.  She underwent an elective left heart cath which identified three-vessel coronary disease. Dr. Kipp Brood discussed the need for coronary artery bypass grafting surgery. Potential risks, benefits, and complications of the surgery were discussed and the patient agreed to proceed. Pre operative carotid duplex US showed a 40-59% right internal carotid artery stenosis and no significant left internal carotid artery stenosis.  Hospital Course: Patient underwent a CABG x 4 on 05/21/2021. She was transferred from the OR to [facility] ICU in stable condition.  He was extubated using standard post cardiac surgical protocols without difficulty.  She will require aggressive pulmonary toilet and her inhalers have been reinitiated.  She is also been started on a nicotine patch and Chantix as she has a significant history of tobacco abuse and quit at time of admission.  Chest tubes were kept in place on postop day 1 for moderate drainage.  She was started intraoperatively on amiodarone after having a brief episode of atrial fibrillation and will continue on amiodarone during the postoperative period her renal function has remained within normal limits and she does have some volume overload which will require diuresis.  Her pacing wires were removed without difficulty.  She was weaned off the Insulin drip. She was restarted on Glipizide. She will be restarted on Januvia at discharge. Her pre op HGA1C is 10.3 and she will require close medical follow up after discharge.She was maintaining NSR and stable for transfer to the progressive care unit on 05/23/2021.  On the floor, she continued to slowly progress. We consulted physical therapy  for assistance with mobility. Chest tubes were removed on 10/21. She had ventricular trigeminy on after transfer. She was on Amiodarone 400 mg bid and Lopressor 12.5 mg bid. Lopressor was titrated to 25 mg bid. She was still volume overloaded and diuresed accordingly. Her wounds are clean, dry, and healing without signs of infection. Motor/sensory is intact LUE. She is ambulating on room air. Amiodarone was decreased to 200 mg bid on 10/23 as she ws in SR with Hr in the 60's. She did request prescriptions for Nicotine patch (which was given) and Chantix. I instructed her to see PCP about starting Chantix. She is felt surgically stable for discharge today.

## 2021-05-21 NOTE — Brief Op Note (Signed)
05/21/2021  9:43 AM  PATIENT:  Lauren David  59 y.o. female  PRE-OPERATIVE DIAGNOSIS:  CAD  POST-OPERATIVE DIAGNOSIS:  CAD  PROCEDURE:  TRANSESOPHAGEAL ECHOCARDIOGRAM (TEE), CORONARY ARTERY BYPASS GRAFTING (CABG) X4 ON PUMP (LIMA to LAD, LEFT RADIAL ARTERY to DIAGONAL, SVG to OM, SVG to PLB) USING LEFT INTERNAL MAMMARY ARTERY AND ENDOSCOPICALLY HARVESTED RIGHT GREATER SAPHENOUS VEIN and LEFT OPEN RADIAL ARTERY HARVEST, APPLICATION OF CELL SAVER   LEFT RADIAL ARTERY HARVEST TIME: 32 minutes;LEFT RADIAL ARTERY PREP TIME:7 minutes RIGHT EVH HARVEST TIME: 42  RIGHT EVH PREP TIME:20  SURGEON:  Surgeon(s) and Role:    Lightfoot, Lucile Crater, MD - Primary  PHYSICIAN ASSISTANTS: 1. Erin Barrett PA-C 2. Lars Pinks PA-C  ASSISTANTS: Dineen Kid RNFA   ANESTHESIA:   general  EBL:  Per anesthesia, perfusion record  DRAINS:  Chest tubes placed in the mediastinal and pleural spaces    COUNTS CORRECT:  YES  DICTATION: .Dragon Dictation  PLAN OF CARE: Admit to inpatient   PATIENT DISPOSITION:  ICU - intubated and hemodynamically stable.   Delay start of Pharmacological VTE agent (>24hrs) due to surgical blood loss or risk of bleeding: yes  BASELINE WEIGHT: 75.8 kg

## 2021-05-22 ENCOUNTER — Inpatient Hospital Stay (HOSPITAL_COMMUNITY): Payer: Medicare Other

## 2021-05-22 ENCOUNTER — Encounter (HOSPITAL_COMMUNITY): Payer: Self-pay | Admitting: Thoracic Surgery (Cardiothoracic Vascular Surgery)

## 2021-05-22 DIAGNOSIS — Z951 Presence of aortocoronary bypass graft: Secondary | ICD-10-CM

## 2021-05-22 LAB — GLUCOSE, CAPILLARY
Glucose-Capillary: 118 mg/dL — ABNORMAL HIGH (ref 70–99)
Glucose-Capillary: 128 mg/dL — ABNORMAL HIGH (ref 70–99)
Glucose-Capillary: 129 mg/dL — ABNORMAL HIGH (ref 70–99)
Glucose-Capillary: 130 mg/dL — ABNORMAL HIGH (ref 70–99)
Glucose-Capillary: 131 mg/dL — ABNORMAL HIGH (ref 70–99)
Glucose-Capillary: 137 mg/dL — ABNORMAL HIGH (ref 70–99)
Glucose-Capillary: 160 mg/dL — ABNORMAL HIGH (ref 70–99)
Glucose-Capillary: 162 mg/dL — ABNORMAL HIGH (ref 70–99)
Glucose-Capillary: 175 mg/dL — ABNORMAL HIGH (ref 70–99)
Glucose-Capillary: 178 mg/dL — ABNORMAL HIGH (ref 70–99)
Glucose-Capillary: 204 mg/dL — ABNORMAL HIGH (ref 70–99)

## 2021-05-22 LAB — LIPID PANEL
Cholesterol: 105 mg/dL (ref 0–200)
HDL: 27 mg/dL — ABNORMAL LOW (ref >40)
LDL Cholesterol: 64 mg/dL (ref 0–99)
Total CHOL/HDL Ratio: 3.9 RATIO
Triglycerides: 72 mg/dL (ref <150)
VLDL: 14 mg/dL (ref 0–40)

## 2021-05-22 LAB — CBC
HCT: 30.4 % — ABNORMAL LOW (ref 36.0–46.0)
HCT: 30.7 % — ABNORMAL LOW (ref 36.0–46.0)
Hemoglobin: 10 g/dL — ABNORMAL LOW (ref 12.0–15.0)
Hemoglobin: 9.6 g/dL — ABNORMAL LOW (ref 12.0–15.0)
MCH: 29.7 pg (ref 26.0–34.0)
MCH: 29.1 pg (ref 26.0–34.0)
MCHC: 32.9 g/dL (ref 30.0–36.0)
MCHC: 31.3 g/dL (ref 30.0–36.0)
MCV: 90.2 fL (ref 80.0–100.0)
MCV: 93 fL (ref 80.0–100.0)
Platelets: 180 10*3/uL (ref 150–400)
Platelets: 152 10*3/uL (ref 150–400)
RBC: 3.37 MIL/uL — ABNORMAL LOW (ref 3.87–5.11)
RBC: 3.3 MIL/uL — ABNORMAL LOW (ref 3.87–5.11)
RDW: 12.5 % (ref 11.5–15.5)
RDW: 12.8 % (ref 11.5–15.5)
WBC: 16.9 10*3/uL — ABNORMAL HIGH (ref 4.0–10.5)
WBC: 17.4 10*3/uL — ABNORMAL HIGH (ref 4.0–10.5)
nRBC: 0 % (ref 0.0–0.2)
nRBC: 0 % (ref 0.0–0.2)

## 2021-05-22 LAB — BASIC METABOLIC PANEL
Anion gap: 7 (ref 5–15)
Anion gap: 8 (ref 5–15)
BUN: <5 mg/dL — ABNORMAL LOW (ref 8–23)
BUN: 7 mg/dL — ABNORMAL LOW (ref 8–23)
CO2: 23 mmol/L (ref 22–32)
CO2: 22 mmol/L (ref 22–32)
Calcium: 7.5 mg/dL — ABNORMAL LOW (ref 8.9–10.3)
Calcium: 7.7 mg/dL — ABNORMAL LOW (ref 8.9–10.3)
Chloride: 108 mmol/L (ref 98–111)
Chloride: 104 mmol/L (ref 98–111)
Creatinine, Ser: 0.51 mg/dL (ref 0.44–1.00)
Creatinine, Ser: 0.65 mg/dL (ref 0.44–1.00)
GFR, Estimated: >60 mL/min (ref >60)
GFR, Estimated: >60 mL/min (ref >60)
Glucose, Bld: 135 mg/dL — ABNORMAL HIGH (ref 70–99)
Glucose, Bld: 188 mg/dL — ABNORMAL HIGH (ref 70–99)
Potassium: 4.1 mmol/L (ref 3.5–5.1)
Potassium: 4 mmol/L (ref 3.5–5.1)
Sodium: 138 mmol/L (ref 135–145)
Sodium: 134 mmol/L — ABNORMAL LOW (ref 135–145)

## 2021-05-22 LAB — MAGNESIUM
Magnesium: 2.1 mg/dL (ref 1.7–2.4)
Magnesium: 2.2 mg/dL (ref 1.7–2.4)

## 2021-05-22 MED ORDER — ASPIRIN 81 MG PO CHEW
324.0000 mg | CHEWABLE_TABLET | Freq: Every day | ORAL | Status: DC
  Administered 2021-05-22 – 2021-05-26 (×5): 324 mg via ORAL
  Filled 2021-05-22 (×5): qty 4

## 2021-05-22 MED ORDER — MIDODRINE HCL 5 MG PO TABS
10.0000 mg | ORAL_TABLET | Freq: Three times a day (TID) | ORAL | Status: DC
  Administered 2021-05-22 – 2021-05-23 (×6): 10 mg via ORAL
  Filled 2021-05-22 (×6): qty 2

## 2021-05-22 MED ORDER — INSULIN ASPART 100 UNIT/ML IJ SOLN
0.0000 [IU] | INTRAMUSCULAR | Status: DC
  Administered 2021-05-22: 4 [IU] via SUBCUTANEOUS
  Administered 2021-05-22: 2 [IU] via SUBCUTANEOUS
  Administered 2021-05-22: 4 [IU] via SUBCUTANEOUS
  Administered 2021-05-22: 8 [IU] via SUBCUTANEOUS
  Administered 2021-05-23: 2 [IU] via SUBCUTANEOUS
  Administered 2021-05-23: 4 [IU] via SUBCUTANEOUS

## 2021-05-22 MED ORDER — METOCLOPRAMIDE HCL 5 MG/ML IJ SOLN
10.0000 mg | Freq: Four times a day (QID) | INTRAMUSCULAR | Status: AC
  Administered 2021-05-22 – 2021-05-23 (×4): 10 mg via INTRAVENOUS
  Filled 2021-05-22 (×4): qty 2

## 2021-05-22 MED ORDER — SODIUM CHLORIDE 0.9% FLUSH: 10.0000 mL | INTRAVENOUS | Status: DC | PRN

## 2021-05-22 MED ORDER — INFLUENZA VAC SPLIT QUAD 0.5 ML IM SUSY: 0.5000 mL | PREFILLED_SYRINGE | INTRAMUSCULAR | Status: DC | PRN

## 2021-05-22 MED ORDER — DOCUSATE SODIUM 50 MG/5ML PO LIQD
200.0000 mg | Freq: Every day | ORAL | Status: DC
  Administered 2021-05-22 – 2021-05-24 (×3): 200 mg via ORAL
  Filled 2021-05-22 (×4): qty 20

## 2021-05-22 MED ORDER — AMLODIPINE BESYLATE 5 MG PO TABS
2.5000 mg | ORAL_TABLET | Freq: Every day | ORAL | Status: DC
  Administered 2021-05-22 – 2021-05-26 (×5): 2.5 mg via ORAL
  Filled 2021-05-22 (×5): qty 1

## 2021-05-22 MED ORDER — SODIUM CHLORIDE 0.9% FLUSH
10.0000 mL | Freq: Two times a day (BID) | INTRAVENOUS | Status: DC
  Administered 2021-05-22 – 2021-05-25 (×6): 10 mL

## 2021-05-22 MED ORDER — AMIODARONE HCL 200 MG PO TABS
400.0000 mg | ORAL_TABLET | Freq: Two times a day (BID) | ORAL | Status: DC
  Administered 2021-05-22 – 2021-05-25 (×8): 400 mg via ORAL
  Filled 2021-05-22 (×8): qty 2

## 2021-05-22 MED ORDER — NICOTINE 14 MG/24HR TD PT24
14.0000 mg | MEDICATED_PATCH | Freq: Every day | TRANSDERMAL | Status: DC
  Administered 2021-05-22 – 2021-05-26 (×5): 14 mg via TRANSDERMAL
  Filled 2021-05-22 (×5): qty 1

## 2021-05-22 MED ORDER — ROSUVASTATIN CALCIUM 20 MG PO TABS
20.0000 mg | ORAL_TABLET | Freq: Every day | ORAL | Status: DC
  Administered 2021-05-22 – 2021-05-26 (×5): 20 mg via ORAL
  Filled 2021-05-22 (×5): qty 1

## 2021-05-22 MED FILL — Calcium Chloride Inj 10%: INTRAVENOUS | Qty: 10 | Status: AC

## 2021-05-22 MED FILL — Heparin Sodium (Porcine) Inj 1000 Unit/ML: INTRAMUSCULAR | Qty: 10 | Status: AC

## 2021-05-22 MED FILL — Electrolyte-R (PH 7.4) Solution: INTRAVENOUS | Qty: 5000 | Status: AC

## 2021-05-22 MED FILL — Sodium Chloride IV Soln 0.9%: INTRAVENOUS | Qty: 3000 | Status: AC

## 2021-05-22 MED FILL — Sodium Bicarbonate IV Soln 8.4%: INTRAVENOUS | Qty: 50 | Status: AC

## 2021-05-22 NOTE — Progress Notes (Signed)
1530- Epicardial Pacing Wire removed per order.  Patient tol procedure well, wire fully intact upon removal, no resistance went.  Patient educated, VSS.

## 2021-05-22 NOTE — Progress Notes (Signed)
Inpatient Diabetes Program Recommendations  AACE/ADA: New Consensus Statement on Inpatient Glycemic Control (2015)  Target Ranges:  Prepandial:   less than 140 mg/dL      Peak postprandial:   less than 180 mg/dL (1-2 hours)      Critically ill patients:  140 - 180 mg/dL   Lab Results  Component Value Date   GLUCAP 118 (H) 05/22/2021   HGBA1C 10.3 (H) 05/17/2021    Review of Glycemic Control Results for CHRISTE, TELLEZ (MRN [id number]) as of 05/22/2021 09:22  Ref. Range 05/22/2021 03:58 05/22/2021 05:03 05/22/2021 06:57  Glucose-Capillary Latest Ref Range: 70 - 99 mg/dL 128 (H) 130 (H) 118 (H)   Diabetes history: DM 2 Outpatient Diabetes medications:  Glucotrol 5 mg bid, Januvia 100 mg daily Current orders for Inpatient glycemic control:  TCTS q 4 hours Inpatient Diabetes Program Recommendations:    May need basal insulin.  Consider Levemir 8 units bid.   Will follow.   Thanks,  Adah Perl, RN, BC-ADM Inpatient Diabetes Coordinator Pager [phone]  (8a-5p)

## 2021-05-22 NOTE — Progress Notes (Addendum)
[address]       [place] [address]             [phone]      TCTS DAILY ICU PROGRESS NOTE                   [address]            [place] [address]          [phone]   1 Day Post-Op Procedure(s) (LRB): CORONARY ARTERY BYPASS GRAFTING (CABG) X4 ON PUMP USING LEFT INTERNAL MAMMARY ARTERY AND ENDOSCOPICALLY HARVESTED RIGHT GREATER SAPHENOUS VEIN (N/A) RADIAL ARTERY HARVEST (Left) TRANSESOPHAGEAL ECHOCARDIOGRAM (TEE) (N/A) APPLICATION OF CELL SAVER (N/A) ENDOVEIN HARVEST OF GREATER SAPHENOUS VEIN (Right)  Total Length of Stay:  LOS: 1 day   Subjective: Some nausea, vomited x 1- better after zofran  Objective: Vital signs in last 24 hours: Temp:  [98.2 F (36.8 C)-99.3 F (37.4 C)] 98.2 F (36.8 C) (10/19 0700) Pulse Rate:  [37-101] 69 (10/19 0755) Cardiac Rhythm: Normal sinus rhythm (10/19 0100) Resp:  [13-28] 22 (10/19 0755) BP: (101-109)/(67-70) 109/67 (10/18 1800) SpO2:  [89 %-100 %] 93 % (10/19 0755) Arterial Line BP: (93-149)/(45-71) 105/53 (10/19 0700) FiO2 (%):  [40 %-50 %] 40 % (10/18 1815) Weight:  [77.7 kg] 77.7 kg (10/19 0500)  Filed Weights   05/21/21 0610 05/22/21 0500  Weight: 75.8 kg 77.7 kg    Weight change: 1.9 kg   Hemodynamic parameters for last 24 hours: CVP:  [6 mmHg-35 mmHg] 14 mmHg  Intake/Output from previous day: 10/18 0701 - 10/19 0700 In: 7667.4 [P.O.:250; I.V.:5112.4; Blood:310; IV Piggyback:1955] Out: 3509 [Urine:2490; Blood:489; Chest Tube:530]  Intake/Output this shift: No intake/output data recorded.  Current Meds: Scheduled Meds:  acetaminophen  1,000 mg Oral Q6H   Or   acetaminophen (TYLENOL) oral liquid 160 mg/5 mL  1,000 mg Per Tube Q6H   amiodarone  400 mg Oral BID   amLODipine  2.5 mg Oral Daily   aspirin EC  325 mg Oral Daily   Or   aspirin  324 mg Per Tube Daily   bisacodyl  10 mg Oral Daily   Or   bisacodyl  10 mg Rectal Daily   Chlorhexidine Gluconate  Cloth  6 each Topical Q0600   docusate sodium  200 mg Oral Daily   insulin aspart  0-24 Units Subcutaneous Q4H   mouth rinse  15 mL Mouth Rinse BID   metoprolol tartrate  12.5 mg Oral BID   Or   metoprolol tartrate  12.5 mg Per Tube BID   midodrine  10 mg Oral TID WC   nicotine  14 mg Transdermal Daily   [START ON 05/23/2021] pantoprazole  40 mg Oral Daily   rosuvastatin  20 mg Oral Daily   sodium chloride flush  3 mL Intravenous Q12H   umeclidinium bromide  1 puff Inhalation Daily   varenicline  0.5 mg Oral Daily   Continuous Infusions:  sodium chloride Stopped (05/22/21 0036)   sodium chloride     sodium chloride     albumin human Stopped (05/21/21 2357)   And   sodium chloride 250 mL (05/21/21 2302)    ceFAZolin (ANCEF) IV Stopped (05/22/21 0410)   famotidine (PEPCID) IV Stopped (05/21/21 1455)   insulin 1.1 Units/hr (05/22/21 0700)   lactated ringers     lactated ringers Stopped (05/21/21 1330)   lactated ringers 20 mL/hr at 05/22/21 0700  PRN Meds:.sodium chloride, albumin human **AND** sodium chloride, dextrose, diphenhydrAMINE, lactated ringers, levalbuterol, metoprolol tartrate, midazolam, morphine injection, ondansetron (ZOFRAN) IV, sodium chloride flush, traMADol  General appearance: alert, cooperative, and no distress Heart: regular rate and rhythm Lungs: coarse Abdomen: soft, non tender or distended Extremities: trace edema Wound: dresings cdi, left hand N/V intact  Lab Results: CBC: Recent Labs    05/21/21 1959 05/22/21 0308  WBC 17.3* 16.9*  HGB 10.5*  9.9* 10.0*  HCT 32.3*  29.0* 30.4*  PLT 183 180   BMET:  Recent Labs    05/21/21 1959 05/22/21 0308  NA 138  143 138  K 3.7  3.6 4.1  CL 109 108  CO2 26 23  GLUCOSE 119* 135*  BUN <5* <5*  CREATININE 0.49 0.51  CALCIUM 7.6* 7.5*    CMET: Lab Results  Component Value Date   WBC 16.9 (H) 05/22/2021   HGB 10.0 (L) 05/22/2021   HCT 30.4 (L) 05/22/2021   PLT 180 05/22/2021   GLUCOSE  135 (H) 05/22/2021   CHOL 105 05/22/2021   TRIG 72 05/22/2021   HDL 27 (L) 05/22/2021   LDLCALC 64 05/22/2021   ALT 21 05/17/2021   AST 20 05/17/2021   NA 138 05/22/2021   K 4.1 05/22/2021   CL 108 05/22/2021   CREATININE 0.51 05/22/2021   BUN <5 (L) 05/22/2021   CO2 23 05/22/2021   TSH 1.380 01/24/2021   INR 1.2 05/21/2021   HGBA1C 10.3 (H) 05/17/2021      PT/INR:  Recent Labs    05/21/21 1352  LABPROT 15.7*  INR 1.2   Radiology: [facility] Chest Port 1 View  Result Date: 05/22/2021 CLINICAL DATA:  Chest tube present s/p open heart urg,very sore chest EXAM: PORTABLE CHEST - 1 VIEW COMPARISON:  the previous day's study FINDINGS: Patient has been extubated and the gastric tube removed. Bilateral chest and mediastinal drains stable. Right IJ central venous catheter to the proximal SVC. Lower lung volumes with mild interstitial prominence and some patchy perihilar infiltrates or atelectasis left greater than right. Heart size normal.  CABG markers. Blunting of lateral costophrenic angles suggesting small effusions. No pneumothorax. Sternotomy wires. IMPRESSION: Interval extubation with lower lung volumes, new patchy perihilar infiltrates or atelectasis left greater than right. Electronically Signed   By: Lucrezia Europe M.D.   On: 05/22/2021 06:14   DG Chest Port 1 View  Result Date: 05/21/2021 CLINICAL DATA:  Postop open heart surgery. EXAM: PORTABLE CHEST 1 VIEW COMPARISON:  Radiographs 05/17/2021. FINDINGS: 1421 hours. Tip of the endotracheal tube is approximately 1.1 cm above the carina. Right IJ central venous catheter projects to the level of the upper SVC. Enteric tube projects below the diaphragm, tip over the proximal stomach. Mediastinal drain and bilateral chest tubes are in place. The heart size and mediastinal contours are stable status post interval median sternotomy and CABG. The lungs appear clear. There is no pleural effusion or pneumothorax. No evidence of acute fracture.  IMPRESSION: Interval CABG without complication. Tip of the endotracheal tube is low, 11 mm above the carina. Electronically Signed   By: Richardean Sale M.D.   On: 05/21/2021 14:49     Assessment/Plan: S/P Procedure(s) (LRB): CORONARY ARTERY BYPASS GRAFTING (CABG) X4 ON PUMP USING LEFT INTERNAL MAMMARY ARTERY AND ENDOSCOPICALLY HARVESTED RIGHT GREATER SAPHENOUS VEIN (N/A) RADIAL ARTERY HARVEST (Left) TRANSESOPHAGEAL ECHOCARDIOGRAM (TEE) (N/A) APPLICATION OF CELL SAVER (N/A) ENDOVEIN HARVEST OF GREATER SAPHENOUS VEIN (Right)  POD#1 1 hemodyn stable , now  off  gtts( was on neo/dobut)- added midodrine po . Crestor added- lines for removal 2 sats ok on 6 liters- resume inhalers, add nicotine patch ( quit smoking on admission), also on chantix- add flutter valve 3 good UOP will need some diuresis 4 CT 530- keep for now 5 sinus with PVC's some increase in QT- monitor- amio changed to po  6 sugars ok , transitioning to SSI, eventual transition to home meds 7 Creat is normal 8 reactive leukocytosis , trending down 9 expected ABLA- stable 10 CXR Interval extubation with lower lung volumes, new patchy perihilar infiltrates or atelectasis left greater than right. 11 cardiac rehab  John Giovanni PA-C Pager [phone] 05/22/2021 8:07 AM   Agree with above Doing well Will continue POD1 progression  Markiyah Gahm O Saarah Dewing

## 2021-05-22 NOTE — Plan of Care (Signed)
  Problem: Education: Goal: Knowledge of General Education information will improve Description: Including pain rating scale, medication(s)/side effects and non-pharmacologic comfort measures Outcome: Progressing   Problem: Clinical Measurements: Goal: Will remain free from infection Outcome: Progressing   Problem: Clinical Measurements: Goal: Respiratory complications will improve Outcome: Progressing   Problem: Clinical Measurements: Goal: Cardiovascular complication will be avoided Outcome: Progressing   Problem: Pain Managment: Goal: General experience of comfort will improve Outcome: Progressing   

## 2021-05-23 ENCOUNTER — Inpatient Hospital Stay (HOSPITAL_COMMUNITY): Payer: Medicare Other

## 2021-05-23 LAB — BASIC METABOLIC PANEL
Anion gap: 6 (ref 5–15)
BUN: 9 mg/dL (ref 8–23)
CO2: 24 mmol/L (ref 22–32)
Calcium: 7.9 mg/dL — ABNORMAL LOW (ref 8.9–10.3)
Chloride: 104 mmol/L (ref 98–111)
Creatinine, Ser: 0.65 mg/dL (ref 0.44–1.00)
GFR, Estimated: >60 mL/min (ref >60)
Glucose, Bld: 167 mg/dL — ABNORMAL HIGH (ref 70–99)
Potassium: 4 mmol/L (ref 3.5–5.1)
Sodium: 134 mmol/L — ABNORMAL LOW (ref 135–145)

## 2021-05-23 LAB — GLUCOSE, CAPILLARY
Glucose-Capillary: 144 mg/dL — ABNORMAL HIGH (ref 70–99)
Glucose-Capillary: 133 mg/dL — ABNORMAL HIGH (ref 70–99)
Glucose-Capillary: 172 mg/dL — ABNORMAL HIGH (ref 70–99)
Glucose-Capillary: 191 mg/dL — ABNORMAL HIGH (ref 70–99)
Glucose-Capillary: 176 mg/dL — ABNORMAL HIGH (ref 70–99)
Glucose-Capillary: 175 mg/dL — ABNORMAL HIGH (ref 70–99)

## 2021-05-23 LAB — CBC
HCT: 31.6 % — ABNORMAL LOW (ref 36.0–46.0)
Hemoglobin: 10 g/dL — ABNORMAL LOW (ref 12.0–15.0)
MCH: 29.3 pg (ref 26.0–34.0)
MCHC: 31.6 g/dL (ref 30.0–36.0)
MCV: 92.7 fL (ref 80.0–100.0)
Platelets: 170 10*3/uL (ref 150–400)
RBC: 3.41 MIL/uL — ABNORMAL LOW (ref 3.87–5.11)
RDW: 12.9 % (ref 11.5–15.5)
WBC: 20.9 10*3/uL — ABNORMAL HIGH (ref 4.0–10.5)
nRBC: 0 % (ref 0.0–0.2)

## 2021-05-23 MED ORDER — SODIUM CHLORIDE 0.9% FLUSH: 3.0000 mL | INTRAVENOUS | Status: DC | PRN

## 2021-05-23 MED ORDER — LIVING WELL WITH DIABETES BOOK
Freq: Once | Status: AC
  Administered 2021-05-24: 1
  Filled 2021-05-23: qty 1

## 2021-05-23 MED ORDER — CHLORHEXIDINE GLUCONATE CLOTH 2 % EX PADS
6.0000 | MEDICATED_PAD | Freq: Every day | CUTANEOUS | Status: DC
  Administered 2021-05-23 – 2021-05-26 (×4): 6 via TOPICAL

## 2021-05-23 MED ORDER — SODIUM CHLORIDE 0.9 % IV SOLN: 250.0000 mL | INTRAVENOUS | Status: DC | PRN

## 2021-05-23 MED ORDER — POTASSIUM CHLORIDE CRYS ER 20 MEQ PO TBCR
40.0000 meq | EXTENDED_RELEASE_TABLET | Freq: Every day | ORAL | Status: DC
  Administered 2021-05-23 – 2021-05-25 (×3): 40 meq via ORAL
  Filled 2021-05-23 (×3): qty 2

## 2021-05-23 MED ORDER — ~~LOC~~ CARDIAC SURGERY, PATIENT & FAMILY EDUCATION: Freq: Once | Status: AC

## 2021-05-23 MED ORDER — INSULIN DETEMIR 100 UNIT/ML ~~LOC~~ SOLN
8.0000 [IU] | Freq: Two times a day (BID) | SUBCUTANEOUS | Status: DC
  Administered 2021-05-23 – 2021-05-25 (×5): 8 [IU] via SUBCUTANEOUS
  Filled 2021-05-23 (×7): qty 0.08

## 2021-05-23 MED ORDER — SODIUM CHLORIDE 0.9% FLUSH
3.0000 mL | Freq: Two times a day (BID) | INTRAVENOUS | Status: DC
  Administered 2021-05-23 – 2021-05-25 (×4): 3 mL via INTRAVENOUS

## 2021-05-23 MED ORDER — METHOCARBAMOL 500 MG PO TABS
500.0000 mg | ORAL_TABLET | Freq: Three times a day (TID) | ORAL | Status: AC
  Administered 2021-05-23 – 2021-05-25 (×8): 500 mg via ORAL
  Filled 2021-05-23 (×8): qty 1

## 2021-05-23 MED ORDER — FUROSEMIDE 40 MG PO TABS
40.0000 mg | ORAL_TABLET | Freq: Every day | ORAL | Status: DC
  Administered 2021-05-23 – 2021-05-25 (×3): 40 mg via ORAL
  Filled 2021-05-23 (×3): qty 1

## 2021-05-23 MED ORDER — INSULIN ASPART 100 UNIT/ML IJ SOLN
0.0000 [IU] | Freq: Three times a day (TID) | INTRAMUSCULAR | Status: DC
  Administered 2021-05-23 – 2021-05-24 (×3): 4 [IU] via SUBCUTANEOUS
  Administered 2021-05-24: 8 [IU] via SUBCUTANEOUS
  Administered 2021-05-25 – 2021-05-26 (×3): 2 [IU] via SUBCUTANEOUS
  Administered 2021-05-26: 4 [IU] via SUBCUTANEOUS

## 2021-05-23 MED FILL — Lidocaine HCl Local Preservative Free (PF) Inj 2%: INTRAMUSCULAR | Qty: 15 | Status: AC

## 2021-05-23 MED FILL — Heparin Sodium (Porcine) Inj 1000 Unit/ML: Qty: 1000 | Status: AC

## 2021-05-23 MED FILL — Potassium Chloride Inj 2 mEq/ML: INTRAVENOUS | Qty: 40 | Status: AC

## 2021-05-23 NOTE — Plan of Care (Signed)
  Problem: Health Behavior/Discharge Planning: Goal: Ability to manage health-related needs will improve Outcome: Progressing   Problem: Clinical Measurements: Goal: Ability to maintain clinical measurements within normal limits will improve Outcome: Progressing Goal: Will remain free from infection Outcome: Progressing Goal: Cardiovascular complication will be avoided Outcome: Progressing   Problem: Activity: Goal: Risk for activity intolerance will decrease Outcome: Progressing   

## 2021-05-23 NOTE — Progress Notes (Addendum)
TCTS DAILY ICU PROGRESS NOTE                   [address]            [place] [address]          [phone]   2 Days Post-Op Procedure(s) (LRB): CORONARY ARTERY BYPASS GRAFTING (CABG) X4 ON PUMP USING LEFT INTERNAL MAMMARY ARTERY AND ENDOSCOPICALLY HARVESTED RIGHT GREATER SAPHENOUS VEIN (N/A) RADIAL ARTERY HARVEST (Left) TRANSESOPHAGEAL ECHOCARDIOGRAM (TEE) (N/A) APPLICATION OF CELL SAVER (N/A) ENDOVEIN HARVEST OF GREATER SAPHENOUS VEIN (Right)  Total Length of Stay:  LOS: 2 days   Subjective:  Patient continues to have pain.    Objective: Vital signs in last 24 hours: Temp:  [97.9 F (36.6 C)-99.1 F (37.3 C)] 97.9 F (36.6 C) (10/20 0744) Pulse Rate:  [52-95] 72 (10/20 0626) Cardiac Rhythm: Normal sinus rhythm (10/20 0334) Resp:  [10-29] 23 (10/20 0626) BP: (73-145)/(45-102) 109/56 (10/20 0626) SpO2:  [90 %-100 %] 98 % (10/20 0716) Arterial Line BP: (69-118)/(44-78) 77/47 (10/19 1230) FiO2 (%):  [36 %] 36 % (10/20 0716) Weight:  [82.7 kg] 82.7 kg (10/20 0500)  Filed Weights   05/21/21 0610 05/22/21 0500 05/23/21 0500  Weight: 75.8 kg 77.7 kg 82.7 kg    Weight change: 5 kg   Hemodynamic parameters for last 24 hours: CVP:  [14 mmHg-45 mmHg] 16 mmHg  Intake/Output from previous day: 10/19 0701 - 10/20 0700 In: 985.3 [P.O.:420; I.V.:263.2; IV Piggyback:302.1] Out: 1580 [Urine:750; Emesis/NG output:400; Chest Tube:430]  Current Meds: Scheduled Meds:  acetaminophen  1,000 mg Oral Q6H   Or   acetaminophen (TYLENOL) oral liquid 160 mg/5 mL  1,000 mg Per Tube Q6H   amiodarone  400 mg Oral BID   amLODipine  2.5 mg Oral Daily   aspirin  324 mg Oral Daily   bisacodyl  10 mg Oral Daily   Or   bisacodyl  10 mg Rectal Daily   Chlorhexidine Gluconate Cloth  6 each Topical Q0600   [facility], Patient [facility] Family Education   Does not apply Once   docusate  200 mg Oral Daily   furosemide  40 mg Oral Daily   insulin aspart  0-24 Units  Subcutaneous Q4H   mouth rinse  15 mL Mouth Rinse BID   methocarbamol  500 mg Oral TID   metoCLOPramide (REGLAN) injection  10 mg Intravenous Q6H   metoprolol tartrate  12.5 mg Oral BID   midodrine  10 mg Oral TID WC   nicotine  14 mg Transdermal Daily   pantoprazole  40 mg Oral Daily   potassium chloride  40 mEq Oral Daily   rosuvastatin  20 mg Oral Daily   sodium chloride flush  10-40 mL Intracatheter Q12H   sodium chloride flush  3 mL Intravenous Q12H   sodium chloride flush  3 mL Intravenous Q12H   umeclidinium bromide  1 puff Inhalation Daily   varenicline  0.5 mg Oral Daily   Continuous Infusions:  sodium chloride     albumin human Stopped (05/21/21 2357)   And   sodium chloride Stopped (05/21/21 2302)    ceFAZolin (ANCEF) IV Stopped (05/23/21 0358)   lactated ringers Stopped (05/22/21 0940)   PRN Meds:.sodium chloride, albumin human **AND** sodium chloride, dextrose, diphenhydrAMINE, influenza vac split quadrivalent PF, levalbuterol, metoprolol tartrate, ondansetron (ZOFRAN) IV, sodium chloride flush, sodium chloride flush, traMADol  General appearance: alert, cooperative, and no distress Heart: regular rate and rhythm  Lungs: diminished breath sounds bibasilar Abdomen: soft, non-tender; bowel sounds normal; no masses,  no organomegaly Extremities: edema trace Wound: clean and dry  Lab Results: CBC: Recent Labs    05/22/21 1700 05/23/21 0429  WBC 17.4* 20.9*  HGB 9.6* 10.0*  HCT 30.7* 31.6*  PLT 152 170   BMET:  Recent Labs    05/22/21 1700 05/23/21 0429  NA 134* 134*  K 4.0 4.0  CL 104 104  CO2 22 24  GLUCOSE 188* 167*  BUN 7* 9  CREATININE 0.65 0.65  CALCIUM 7.7* 7.9*    CMET: Lab Results  Component Value Date   WBC 20.9 (H) 05/23/2021   HGB 10.0 (L) 05/23/2021   HCT 31.6 (L) 05/23/2021   PLT 170 05/23/2021   GLUCOSE 167 (H) 05/23/2021   CHOL 105 05/22/2021   TRIG 72 05/22/2021   HDL 27 (L) 05/22/2021   LDLCALC 64 05/22/2021   ALT 21  05/17/2021   AST 20 05/17/2021   NA 134 (L) 05/23/2021   K 4.0 05/23/2021   CL 104 05/23/2021   CREATININE 0.65 05/23/2021   BUN 9 05/23/2021   CO2 24 05/23/2021   TSH 1.380 01/24/2021   INR 1.2 05/21/2021   HGBA1C 10.3 (H) 05/17/2021      PT/INR:  Recent Labs    05/21/21 1352  LABPROT 15.7*  INR 1.2   Radiology: No results found.   Assessment/Plan: S/P Procedure(s) (LRB): CORONARY ARTERY BYPASS GRAFTING (CABG) X4 ON PUMP USING LEFT INTERNAL MAMMARY ARTERY AND ENDOSCOPICALLY HARVESTED RIGHT GREATER SAPHENOUS VEIN (N/A) RADIAL ARTERY HARVEST (Left) TRANSESOPHAGEAL ECHOCARDIOGRAM (TEE) (N/A) APPLICATION OF CELL SAVER (N/A) ENDOVEIN HARVEST OF GREATER SAPHENOUS VEIN (Right)  CV-NSR, BP slightly improved on Midodrine Pulm- CT output remains too high for removal, remains on oxygen at 4L via [place], continue pulmonary toilet Renal-creatinine has been WNL, weight remains elevated, diuretics ordered, K is at 4.0 4. Pain control- Robaxin added for additional relief, continue prn meds 5. DM-sugars controlled, patient is a diabetic, basal insulin to be adjusted by pharmacy 6. Dispo- patient stable, leave chest tubes in place today, continue diuretics, maintaining NSR with improved BP on Midodrine, transfer orders to 4E have been placed  Erin Barrett, PA-C 05/23/2021 7:53 AM  Will transfer to 4E Adjusting pain medication Increasing insulin  Cristian Grieves O Audreyanna Butkiewicz

## 2021-05-23 NOTE — Progress Notes (Signed)
CARDIAC REHAB PHASE I   Offered to walk with pt. Pt states recent ambulation with RN. Tired this afternoon. Encouraged continued ambulation and IS use. Will continue to follow.  Rufina Falco, RN BSN 05/23/2021 1:31 PM

## 2021-05-23 NOTE — Progress Notes (Signed)
Inpatient Diabetes Program Recommendations  AACE/ADA: New Consensus Statement on Inpatient Glycemic Control (2015)  Target Ranges:  Prepandial:   less than 140 mg/dL      Peak postprandial:   less than 180 mg/dL (1-2 hours)      Critically ill patients:  140 - 180 mg/dL   Lab Results  Component Value Date   GLUCAP 176 (H) 05/23/2021   HGBA1C 10.3 (H) 05/17/2021   Results for Lauren David, Lauren David (MRN [id number]) as of 05/23/2021 17:01  Ref. Range 01/24/2021 09:37 05/17/2021 15:00  Hemoglobin A1C Latest Ref Range: 4.8 - 5.6 % 11.7 (H) 10.3 (H)   Review of Glycemic Control Results for Lauren David, Lauren David (MRN [id number]) as of 05/23/2021 17:01  Ref. Range 05/22/2021 23:32 05/23/2021 03:25 05/23/2021 06:25 05/23/2021 11:07 05/23/2021 16:28  Glucose-Capillary Latest Ref Range: 70 - 99 mg/dL 162 (H) 133 (H) 172 (H) 191 (H) 176 (H)  Diabetes history: DM 2 Outpatient Diabetes medications:  Glucotrol 5 mg bid, Januvia 100 mg daily Current orders for Inpatient glycemic control:  TCTS tid with meals and HS Levemir 8 units bid Inpatient Diabetes Program Recommendations:    Spoke with patient regarding elevated A1C.  Of note, her A1C is slightly better.  She admits that she does not check her blood sugars.  Asked her about her willingness to be on insulin and she states "I'd rather not".    May be a candidate for the addition of another oral agent such as a SGLT-2?  We briefly discussed dietary and lifestyle modifications that can be done to improve her blood sugar management, including eliminating sugar from beverages and exercise.  Will order LWWD booklet for patient as well.  We discussed goal blood sugar values and the importance of checking blood sugars at least bid when she goes home.  I explained the importance of glycemic control for healing as well.  Patient verbalized understanding.  Will follow.   Thanks,  Adah Perl, RN, BC-ADM Inpatient Diabetes Coordinator Pager [phone]  (8a-5p)

## 2021-05-23 NOTE — Progress Notes (Signed)
Pt admitted from [place], VSS, CHG complete, oriented to unit, call light within reach, orders released.   Chrisandra Carota, RN 05/23/2021 3:34 PM

## 2021-05-23 NOTE — Progress Notes (Signed)
Verbal order to discontinue Prevena wound vac per Dr. Kipp Brood during morning rounds. Dressing still in place.

## 2021-05-23 NOTE — Progress Notes (Signed)
Prevena dressing removed, betadine applied, pt tolerated well.   Chrisandra Carota, RN 05/23/2021 5:28 PM

## 2021-05-24 LAB — GLUCOSE, CAPILLARY
Glucose-Capillary: 200 mg/dL — ABNORMAL HIGH (ref 70–99)
Glucose-Capillary: 209 mg/dL — ABNORMAL HIGH (ref 70–99)
Glucose-Capillary: 87 mg/dL (ref 70–99)
Glucose-Capillary: 163 mg/dL — ABNORMAL HIGH (ref 70–99)

## 2021-05-24 LAB — BASIC METABOLIC PANEL
Anion gap: 9 (ref 5–15)
BUN: 12 mg/dL (ref 8–23)
CO2: 23 mmol/L (ref 22–32)
Calcium: 8.3 mg/dL — ABNORMAL LOW (ref 8.9–10.3)
Chloride: 102 mmol/L (ref 98–111)
Creatinine, Ser: 0.58 mg/dL (ref 0.44–1.00)
GFR, Estimated: >60 mL/min (ref >60)
Glucose, Bld: 203 mg/dL — ABNORMAL HIGH (ref 70–99)
Potassium: 4.2 mmol/L (ref 3.5–5.1)
Sodium: 134 mmol/L — ABNORMAL LOW (ref 135–145)

## 2021-05-24 LAB — CBC
HCT: 30 % — ABNORMAL LOW (ref 36.0–46.0)
Hemoglobin: 9.8 g/dL — ABNORMAL LOW (ref 12.0–15.0)
MCH: 29.4 pg (ref 26.0–34.0)
MCHC: 32.7 g/dL (ref 30.0–36.0)
MCV: 90.1 fL (ref 80.0–100.0)
Platelets: 168 10*3/uL (ref 150–400)
RBC: 3.33 MIL/uL — ABNORMAL LOW (ref 3.87–5.11)
RDW: 12.7 % (ref 11.5–15.5)
WBC: 15.6 10*3/uL — ABNORMAL HIGH (ref 4.0–10.5)
nRBC: 0 % (ref 0.0–0.2)

## 2021-05-24 MED ORDER — PROMETHAZINE HCL 12.5 MG PO TABS
12.5000 mg | ORAL_TABLET | Freq: Four times a day (QID) | ORAL | Status: DC | PRN
  Filled 2021-05-24: qty 1

## 2021-05-24 MED ORDER — MIDODRINE HCL 5 MG PO TABS
5.0000 mg | ORAL_TABLET | Freq: Three times a day (TID) | ORAL | Status: DC
  Administered 2021-05-24 – 2021-05-25 (×4): 5 mg via ORAL
  Filled 2021-05-24 (×4): qty 1

## 2021-05-24 NOTE — Progress Notes (Signed)
Pt refused incentive spirometer. Pt educated and encouraged to use.  Pt verbalized understanding.  Pt refused to ambulate.Pt educated about the need the to ambulate.Pt verbalized understanding.

## 2021-05-24 NOTE — Progress Notes (Addendum)
Initial Nutrition Assessment  DOCUMENTATION CODES:   Not applicable  INTERVENTION:  - Liberalize diet to CHO modified diet  - Night time snacks  - Educated pt on importance of balanced meals for blood sugar control and getting adequate protein for wound healing and maintaining muscle mass   NUTRITION DIAGNOSIS:   Increased nutrient needs related to wound healing as evidenced by estimated needs.  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 PMH includes T2DM, COPD, HLD, HTN, and smoker.  10/18 CABG x4  Pt reports having a poor appetite since admission. PTA she reports "eating good" and having 2 meals per day. She does not usually eat breakfast. For lunch she will have chicken noodle soup or broccoli cheddar soup with some crackers. For dinner she will have chicken, rice, and a vegetable. Pt states that she can only eat soft foods d/t poor dentition. She has upper dentures but they hurt so she does not usually wear them. Denies need a soft diet as she orders what she knows that she will eat. Meal Completion: 0-20% x 4 meals (10/19-10/21). At home she reports drinking 3 diet mountain dews per day as well as sweet tea. Pt states that she is going to try to drink less and switch them for water as she realizes these drinks contain a lot of sugar, caffeine and sodium.   Spoke with pt about the importance of eating smaller meals consisting of protein throughout the day if unable to eat a large meal to help maintain her blood sugar throughout the day to help promote wound healing and muscle maintenance.   She does not usually check her blood sugar at home but wants to start to see if she can manage her blood sugar. She states her usually weight is 180 lbs but  has had a 40 lb weight loss in 6 months and thinks this is d/t her diabetes.   Medications: dulcolax, colace, lasix, SSI, levemir, protonix, KCl  Labs: HgbA1C 10.3, CGB 175-209 x24 hours, sodium 134   NUTRITION - FOCUSED PHYSICAL EXAM:  Flowsheet Row Most Recent Value  Orbital Region Mild depletion  Upper Arm Region No depletion  Thoracic and Lumbar Region No depletion  Buccal Region No depletion  Temple Region Mild depletion  Clavicle Bone Region No depletion  Clavicle and Acromion Bone Region No depletion  Scapular Bone Region No depletion  Dorsal Hand No depletion  Patellar Region Mild depletion  Anterior Thigh Region Mild depletion  Posterior Calf Region Mild depletion  Edema (RD Assessment) Mild  Hair Reviewed  Eyes Reviewed  Mouth Reviewed  [poor dentition, yellow coating on tongue]  Skin Reviewed  Nails Reviewed       Diet Order:   Diet Order             Diet heart healthy/carb modified Room service appropriate? Yes; Fluid consistency: Thin  Diet effective now                   EDUCATION NEEDS:   Education needs have been addressed  Skin:  Skin Assessment: Skin Integrity Issues: Skin Integrity Issues:: Incisions Incisions: L arm; R leg; chest  Last BM:  05/24/21  Height:   Ht Readings from Last 1 Encounters:  05/22/21  5\' 1"  (1.549 m)    Weight:   Wt Readings from Last 1 Encounters:  05/24/21 82.7 kg    BMI:  Body mass index is 34.45 kg/m.  Estimated Nutritional Needs:   Kcal:  1600-1800  Protein:  80-95g  Fluid:  >1.6L  Clayborne Dana, RDN, LDN Clinical Nutrition

## 2021-05-24 NOTE — Progress Notes (Signed)
Inpatient Diabetes Program Recommendations  AACE/ADA: New Consensus Statement on Inpatient Glycemic Control  Target Ranges:  Prepandial:   less than 140 mg/dL      Peak postprandial:   less than 180 mg/dL (1-2 hours)      Critically ill patients:  140 - 180 mg/dL   Results for Lauren David, Lauren David (MRN [id number]) as of 05/24/2021 13:28  Ref. Range 05/23/2021 06:25 05/23/2021 11:07 05/23/2021 16:28 05/23/2021 21:02 05/24/2021 06:12 05/24/2021 11:53  Glucose-Capillary Latest Ref Range: 70 - 99 mg/dL 172 (H) 191 (H) 176 (H) 175 (H) 200 (H) 209 (H)    Review of Glycemic Control  Diabetes history: DM2 Outpatient Diabetes medications: Glipizide 5 mg BID, Januvia 10 mg daily Current orders for Inpatient glycemic control: Levemir 8 units BID, Novolog 0-24 units TID with meals  Inpatient Diabetes Program Recommendations:    Insulin: Please consider increasing Levemir to 10 units BID and ordering Novolog 3 units TID with meals for meal coverage if patient eats at least 50% of meals.  Thanks, Barnie Alderman, RN, MSN, CDE Diabetes Coordinator Inpatient Diabetes Program [phone] (Team Pager from 8am to 5pm)

## 2021-05-24 NOTE — Care Management Important Message (Signed)
Important Message  Patient Details  Name: Lauren David MRN: [id number] Date of Birth: 1961/01/08   Medicare Important Message Given:  Yes     Shelda Altes 05/24/2021, 10:50 AM

## 2021-05-24 NOTE — Progress Notes (Addendum)
      [address]       [place] [address]             [phone]      3 Days Post-Op Procedure(s) (LRB): CORONARY ARTERY BYPASS GRAFTING (CABG) X4 ON PUMP USING LEFT INTERNAL MAMMARY ARTERY AND ENDOSCOPICALLY HARVESTED RIGHT GREATER SAPHENOUS VEIN (N/A) RADIAL ARTERY HARVEST (Left) TRANSESOPHAGEAL ECHOCARDIOGRAM (TEE) (N/A) APPLICATION OF CELL SAVER (N/A) ENDOVEIN HARVEST OF GREATER SAPHENOUS VEIN (Right) Subjective: She feels okay, she is uncomfortable in the bed this morning, so we helped her get more comfortable.   Objective: Vital signs in last 24 hours: Temp:  [97.7 F (36.5 C)-98.6 F (37 C)] 98.6 F (37 C) (10/21 0350) Pulse Rate:  [48-77] 77 (10/21 0655) Cardiac Rhythm: Normal sinus rhythm (10/20 1901) Resp:  [14-27] 21 (10/21 0655) BP: (103-145)/(52-77) 145/70 (10/21 0350) SpO2:  [86 %-99 %] 95 % (10/21 0655) FiO2 (%):  [36 %] 36 % (10/20 0805) Weight:  [82.7 kg] 82.7 kg (10/21 0350)  Hemodynamic parameters for last 24 hours: CVP:  [14 mmHg] 14 mmHg  Intake/Output from previous day: 10/20 0701 - 10/21 0700 In: 849.5 [P.O.:750; IV Piggyback:99.5] Out: 425 [Urine:100; Chest Tube:325] Intake/Output this shift: No intake/output data recorded.  General appearance: alert, cooperative, and no distress Heart: regular rate and rhythm, S1, S2 normal, no murmur, click, rub or gallop Lungs: clear to auscultation bilaterally Abdomen: soft, non-tender; bowel sounds normal; no masses,  no organomegaly Extremities: extremities normal, atraumatic, no cyanosis or edema Wound: clean and dry  Lab Results: Recent Labs    05/23/21 0429 05/24/21 0500  WBC 20.9* 15.6*  HGB 10.0* 9.8*  HCT 31.6* 30.0*  PLT 170 168   BMET:  Recent Labs    05/23/21 0429 05/24/21 0500  NA 134* 134*  K 4.0 4.2  CL 104 102  CO2 24 23  GLUCOSE 167* 203*  BUN 9 12  CREATININE 0.65 0.58  CALCIUM 7.9* 8.3*    PT/INR:  Recent Labs    05/21/21 1352  LABPROT 15.7*   INR 1.2   ABG    Component Value Date/Time   PHART 7.306 (L) 05/21/2021 1959   HCO3 24.1 05/21/2021 1959   TCO2 26 05/21/2021 1959   ACIDBASEDEF 2.0 05/21/2021 1959   O2SAT 92.0 05/21/2021 1959   CBG (last 3)  Recent Labs    05/23/21 1628 05/23/21 2102 05/24/21 0612  GLUCAP 176* 175* 200*    Assessment/Plan: S/P Procedure(s) (LRB): CORONARY ARTERY BYPASS GRAFTING (CABG) X4 ON PUMP USING LEFT INTERNAL MAMMARY ARTERY AND ENDOSCOPICALLY HARVESTED RIGHT GREATER SAPHENOUS VEIN (N/A) RADIAL ARTERY HARVEST (Left) TRANSESOPHAGEAL ECHOCARDIOGRAM (TEE) (N/A) APPLICATION OF CELL SAVER (N/A) ENDOVEIN HARVEST OF GREATER SAPHENOUS VEIN (Right)  CV-NSR, hypertensive this morning, will decrease midodrine to 5mg  TID.  Pulm- CT output remains too high for removal, 325cc in 24 hours, remains on oxygen at 1L via [place], continue pulmonary toilet Renal-creatinine 0.58, weight remains elevated, diuretics ordered, K is at 4.2 4. Pain control- Robaxin added for additional relief, continue prn meds 5. DM-sugars controlled, patient is a diabetic, basal insulin to be adjusted by pharmacy   Plan: Patient stable, chest tubes out today, continue diuretics, maintaining NSR, continue ambulation as tolerated. PT consult for debility.    LOS: 3 days    Elgie Collard 05/24/2021  Aggressive physical therapy. Continue diuresis. Will remove chest tubes.  Renu Asby Bary Leriche

## 2021-05-24 NOTE — Progress Notes (Signed)
Chest tube removed . VSS, pt tolerated well. Will continue to monitor. Pt IJ removed dressing placed and education on bedrest for 27min.    Phoebe Sharps, RN

## 2021-05-24 NOTE — Plan of Care (Signed)
°  Problem: Clinical Measurements: °Goal: Will remain free from infection °Outcome: Progressing °  °Problem: Activity: °Goal: Risk for activity intolerance will decrease °Outcome: Progressing °  °Problem: Nutrition: °Goal: Adequate nutrition will be maintained °Outcome: Progressing °  °

## 2021-05-24 NOTE — Progress Notes (Signed)
CARDIAC REHAB PHASE I   PRE:  Rate/Rhythm: 73 SR     SaO2: 94 1L  MODE:  Ambulation: 300 ft   POST:  Rate/Rhythm: 76 SR  BP:  Sitting: 125/72    SaO2: 95 2L --> 92 RA   Pt assisted to BSC than ambulated 372ft in hallway assist of one with EVA. Pt denies CP, SOB, or dizziness throughout walk. Pt returned to recliner. Encouraged continued ambulation and IS use. Will continue to follow.  [id number] Rufina Falco, RN BSN 05/24/2021 11:37 AM

## 2021-05-25 ENCOUNTER — Inpatient Hospital Stay (HOSPITAL_COMMUNITY): Payer: Medicare Other

## 2021-05-25 LAB — GLUCOSE, CAPILLARY
Glucose-Capillary: 125 mg/dL — ABNORMAL HIGH (ref 70–99)
Glucose-Capillary: 160 mg/dL — ABNORMAL HIGH (ref 70–99)
Glucose-Capillary: 141 mg/dL — ABNORMAL HIGH (ref 70–99)
Glucose-Capillary: 150 mg/dL — ABNORMAL HIGH (ref 70–99)

## 2021-05-25 MED ORDER — METOPROLOL TARTRATE 25 MG PO TABS
25.0000 mg | ORAL_TABLET | Freq: Two times a day (BID) | ORAL | Status: DC
  Administered 2021-05-25 – 2021-05-26 (×2): 25 mg via ORAL
  Filled 2021-05-25 (×2): qty 1

## 2021-05-25 MED ORDER — POTASSIUM CHLORIDE CRYS ER 20 MEQ PO TBCR: 20.0000 meq | EXTENDED_RELEASE_TABLET | Freq: Two times a day (BID) | ORAL | Status: DC

## 2021-05-25 MED ORDER — MIDODRINE HCL 5 MG PO TABS
5.0000 mg | ORAL_TABLET | Freq: Two times a day (BID) | ORAL | Status: DC
  Administered 2021-05-25 – 2021-05-26 (×2): 5 mg via ORAL
  Filled 2021-05-25 (×2): qty 1

## 2021-05-25 MED ORDER — INSULIN DETEMIR 100 UNIT/ML ~~LOC~~ SOLN: 10.0000 [IU] | Freq: Two times a day (BID) | SUBCUTANEOUS | Status: DC

## 2021-05-25 MED ORDER — FUROSEMIDE 40 MG PO TABS
40.0000 mg | ORAL_TABLET | Freq: Two times a day (BID) | ORAL | Status: DC
  Administered 2021-05-25 – 2021-05-26 (×2): 40 mg via ORAL
  Filled 2021-05-25 (×2): qty 1

## 2021-05-25 MED ORDER — ASPIRIN 81 MG PO CHEW: 324.0000 mg | CHEWABLE_TABLET | Freq: Every day | ORAL | Status: DC

## 2021-05-25 MED ORDER — INSULIN DETEMIR 100 UNIT/ML ~~LOC~~ SOLN
8.0000 [IU] | Freq: Two times a day (BID) | SUBCUTANEOUS | Status: DC
  Administered 2021-05-25 – 2021-05-26 (×2): 8 [IU] via SUBCUTANEOUS
  Filled 2021-05-25 (×3): qty 0.08

## 2021-05-25 NOTE — Progress Notes (Signed)
CARDIAC REHAB PHASE I   PRE:  Rate/Rhythm: 23 SR  BP:  Sitting: 137/93      SaO2: 97 RA  MODE:  Ambulation: 470 ft   POST:  Rate/Rhythm: 79 SR with PVCs  BP:  Sitting: 152/73    SaO2: 96 RA   Pt ambulated 457ft in hallway standby assist with front wheel walker. Pt denies CP, SOB, or dizziness. Pt educated on importance of site care and monitoring incisions daily. Encouraged continued IS use, walks, and sternal precautions. Pt given in-the-tube sheet along with heart healthy and diabetic diets. Encouraged smoking cessation, tip sheet given. Reviewed restrictions and exercise guidelines. Will refer to CRP II [place].  [id number] Rufina Falco, RN BSN 05/25/2021 12:14 PM

## 2021-05-25 NOTE — Progress Notes (Addendum)
      [address]       [place] [address]             [phone]        4 Days Post-Op Procedure(s) (LRB): CORONARY ARTERY BYPASS GRAFTING (CABG) X4 ON PUMP USING LEFT INTERNAL MAMMARY ARTERY AND ENDOSCOPICALLY HARVESTED RIGHT GREATER SAPHENOUS VEIN (N/A) RADIAL ARTERY HARVEST (Left) TRANSESOPHAGEAL ECHOCARDIOGRAM (TEE) (N/A) APPLICATION OF CELL SAVER (N/A) ENDOVEIN HARVEST OF GREATER SAPHENOUS VEIN (Right)  Subjective: Patient about to taking morning pills. She has no specific complaint this am.  Objective: Vital signs in last 24 hours: Temp:  [97.6 F (36.4 C)-99.2 F (37.3 C)] (P) 97.6 F (36.4 C) (10/22 0834) Pulse Rate:  [70-99] 78 (10/22 0834) Cardiac Rhythm: Normal sinus rhythm (10/21 1900) Resp:  [16-20] 16 (10/22 0834) BP: (111-141)/(48-72) (P) 139/54 (10/22 0834) SpO2:  [91 %-96 %] 91 % (10/22 0348) Weight:  [82.8 kg] 82.8 kg (10/22 0348)  Pre op weight 75.8 kg Current Weight  05/25/21 82.8 kg      Intake/Output from previous day: 10/21 0701 - 10/22 0700 In: 360 [P.O.:360] Out: 800 [Urine:800]   Physical Exam:  Cardiovascular: RRR Pulmonary: Diminished bibasilar breath sounds Abdomen: Soft, non tender, bowel sounds present. Extremities: Mild bilateral lower extremity edema. Motor/sensory LUE intact Wounds: Sternal and RLE wounds are clean and dry.  No erythema or signs of infection.  Lab Results: CBC: Recent Labs    05/23/21 0429 05/24/21 0500  WBC 20.9* 15.6*  HGB 10.0* 9.8*  HCT 31.6* 30.0*  PLT 170 168   BMET:  Recent Labs    05/23/21 0429 05/24/21 0500  NA 134* 134*  K 4.0 4.2  CL 104 102  CO2 24 23  GLUCOSE 167* 203*  BUN 9 12  CREATININE 0.65 0.58  CALCIUM 7.9* 8.3*    PT/INR:  Lab Results  Component Value Date   INR 1.2 05/21/2021   INR 1.0 05/17/2021   ABG:  INR: Will add last result for INR, ABG once components are confirmed Will add last 4 CBG results once components are  confirmed  Assessment/Plan:  1. CV - Previous a fib. Maintaining SR;ventricular trigeminy. On Amiodarone 400 mg bid, Amlodipine 2.5 mg daily, Lopressor 12.5 mg bid, and Midodrine 5 mg tid. BP improved so will decrease Midodrine to 5 mg bid;hope to stop at discharge. Will increase Lopressor to 25 mg bid. 2.  Pulmonary - On room air. Check PA/LAT CXR. Encourage incentive spirometer. 3. Volume Overload - On Lasix 40 mg daily but will increase to bid today. 4.  Expected post op acute blood loss anemia - Last H and H stable at 9.8 and 30. 5. DM-CBGs 87/163/125. On Pre op HGA1C 10.3. Will restart Glipizide at discharge. Will restart Januvia at discharge.Unfortunately, she cannot tolerate Metformin (diarrhea). She will need close medical follow up with medical doctor after discharge.  6. Deconditioned-continue with PT 7. Hope to discharge in am  Donielle M ZimmermanPA-C 05/25/2021,8:37 AM    Chart reviewed, patient examined, agree with above. Wt is 15 lbs over preop. Continue diuresis.

## 2021-05-25 NOTE — Evaluation (Signed)
Physical Therapy Evaluation Patient Details Name: Lauren David MRN: [id number] DOB: March 02, 1961 Today's Date: 05/25/2021  History of Present Illness  The pt is a 60 yo female presenting s/p CABG x4 on 10/18. PMH includes: CAD, chronic back pain, COPD, HTN, HLD, and DM II.   Clinical Impression  Pt in bed upon arrival of PT, agreeable to evaluation at this time. Prior to admission the pt was completely independent without need for DME or assist with any ADLs or IADLs. She lives with multiple adult grandchildren who can assist as needed and has arranged for ex-spouse to stay for a few weeks after d/c to assist as needed. The pt was able to demo great independence with all bed mobility and sit-stand transfers this morning, but does benefit from intermittent cues for sternal precautions with transfers. She was able to ambulate both with and without use of RW with good stability, but reports improved confidence and demos improved gait speed with use of RW at this time. We discussed progressive return to mobility and ambulation at home and the pt expressed understanding. She is safe to return home once medically cleared, but will benefit from skilled PT to progress activity tolerance and dynamic stability to return to full independence.         Recommendations for follow up therapy are one component of a multi-disciplinary discharge planning process, led by the attending physician.  Recommendations may be updated based on patient status, additional functional criteria and insurance authorization.  Follow Up Recommendations No PT follow up;Supervision for mobility/OOB    Equipment Recommendations  None recommended by PT    Recommendations for Other Services       Precautions / Restrictions Precautions Precautions: Sternal Precaution Booklet Issued: Yes (comment) Precaution Comments: reviewed Restrictions Weight Bearing Restrictions: Yes Other Position/Activity Restrictions: sternal  precautions      Mobility  Bed Mobility Overal bed mobility: Modified Independent             General bed mobility comments: increased time, no assist    Transfers Overall transfer level: Modified independent Equipment used: None             General transfer comment: increased time, cues for sternal precautions, but pt able to complete multiple times through session without assist  Ambulation/Gait Ambulation/Gait assistance: Supervision Gait Distance (Feet): 200 Feet Assistive device: Rolling walker (2 wheeled);None Gait Pattern/deviations: Step-through pattern;Decreased stride length;Wide base of support Gait velocity: 0.44 m/s Gait velocity interpretation: 1.31 - 2.62 ft/sec, indicative of limited community ambulator General Gait Details: pt with small strides but no overt LOB both with use of RW or without AD.      Balance Overall balance assessment: Mild deficits observed, not formally tested                                           Pertinent Vitals/Pain Pain Assessment: No/denies pain    Home Living Family/patient expects to be discharged to:: Private residence Living Arrangements: Other relatives Available Help at Discharge: Family;Available 24 hours/day Type of Home: House Home Access: Level entry     Home Layout: One level Home Equipment: Walker - 2 wheels;Grab bars - tub/shower      Prior Function Level of Independence: Independent         Comments: no use of AD, driving     Hand Dominance   Dominant Hand: Right  Extremity/Trunk Assessment   Upper Extremity Assessment Upper Extremity Assessment: Overall WFL for tasks assessed    Lower Extremity Assessment Lower Extremity Assessment: Overall WFL for tasks assessed    Cervical / Trunk Assessment Cervical / Trunk Assessment: Normal;Other exceptions Cervical / Trunk Exceptions: s/p thoracic surgery  Communication   Communication: No difficulties  Cognition  Arousal/Alertness: Awake/alert Behavior During Therapy: WFL for tasks assessed/performed Overall Cognitive Status: Within Functional Limits for tasks assessed                                        General Comments General comments (skin integrity, edema, etc.): VSS on RA, HR 92bpm    Exercises     Assessment/Plan    PT Assessment Patient needs continued PT services  PT Problem List Decreased strength;Decreased activity tolerance;Decreased balance       PT Treatment Interventions DME instruction;Gait training;Stair training;Functional mobility training;Therapeutic activities;Therapeutic exercise;Balance training    PT Goals (Current goals can be found in the Care Plan section)  Acute Rehab PT Goals Patient Stated Goal: return home PT Goal Formulation: With patient Time For Goal Achievement: 06/01/21 Potential to Achieve Goals: Good    Frequency Min 3X/week    AM-PAC PT "6 Clicks" Mobility  Outcome Measure Help needed turning from your back to your side while in a flat bed without using bedrails?: None Help needed moving from lying on your back to sitting on the side of a flat bed without using bedrails?: None Help needed moving to and from a bed to a chair (including a wheelchair)?: None Help needed standing up from a chair using your arms (e.g., wheelchair or bedside chair)?: None Help needed to walk in hospital room?: None Help needed climbing 3-5 steps with a railing? : A Little 6 Click Score: 23    End of Session Equipment Utilized During Treatment: Gait belt Activity Tolerance: Patient tolerated treatment well Patient left: in bed;with call bell/phone within reach (sitting EOB) Nurse Communication: Mobility status PT Visit Diagnosis: Other abnormalities of gait and mobility (R26.89)    Time: 2876-8115 PT Time Calculation (min) (ACUTE ONLY): 24 min   Charges:   PT Evaluation $PT Eval Low Complexity: 1 Low PT Treatments $Gait Training: 8-22  mins        West Carbo, PT, DPT   Acute Rehabilitation Department Pager #: [phone]  Sandra Cockayne 05/25/2021, 10:11 AM

## 2021-05-26 LAB — GLUCOSE, CAPILLARY
Glucose-Capillary: 124 mg/dL — ABNORMAL HIGH (ref 70–99)
Glucose-Capillary: 196 mg/dL — ABNORMAL HIGH (ref 70–99)

## 2021-05-26 MED ORDER — ROSUVASTATIN CALCIUM 20 MG PO TABS: 20.0000 mg | ORAL_TABLET | Freq: Every day | ORAL | 1 refills | Status: DC

## 2021-05-26 MED ORDER — TRAMADOL HCL 50 MG PO TABS: 50.0000 mg | ORAL_TABLET | Freq: Four times a day (QID) | ORAL | 0 refills | Status: DC | PRN

## 2021-05-26 MED ORDER — AMLODIPINE BESYLATE 2.5 MG PO TABS: 2.5000 mg | ORAL_TABLET | Freq: Every day | ORAL | 0 refills | Status: DC

## 2021-05-26 MED ORDER — NICOTINE 14 MG/24HR TD PT24: 14.0000 mg | MEDICATED_PATCH | Freq: Every day | TRANSDERMAL | 1 refills | Status: DC

## 2021-05-26 MED ORDER — AMIODARONE HCL 200 MG PO TABS: 200.0000 mg | ORAL_TABLET | Freq: Two times a day (BID) | ORAL | 0 refills | Status: DC

## 2021-05-26 MED ORDER — METOPROLOL TARTRATE 25 MG PO TABS: 25.0000 mg | ORAL_TABLET | Freq: Two times a day (BID) | ORAL | 1 refills | Status: DC

## 2021-05-26 MED ORDER — POTASSIUM CHLORIDE CRYS ER 20 MEQ PO TBCR
20.0000 meq | EXTENDED_RELEASE_TABLET | Freq: Every day | ORAL | Status: DC
  Administered 2021-05-26: 20 meq via ORAL
  Filled 2021-05-26: qty 1

## 2021-05-26 MED ORDER — POTASSIUM CHLORIDE CRYS ER 20 MEQ PO TBCR
20.0000 meq | EXTENDED_RELEASE_TABLET | Freq: Every day | ORAL | 0 refills | Status: DC
Start: 2021-05-26 — End: 2021-06-10

## 2021-05-26 MED ORDER — FUROSEMIDE 40 MG PO TABS: 40.0000 mg | ORAL_TABLET | Freq: Two times a day (BID) | ORAL | 0 refills | Status: DC

## 2021-05-26 MED ORDER — AMIODARONE HCL 200 MG PO TABS
200.0000 mg | ORAL_TABLET | Freq: Two times a day (BID) | ORAL | Status: DC
  Administered 2021-05-26: 200 mg via ORAL
  Filled 2021-05-26: qty 1

## 2021-05-26 NOTE — Progress Notes (Signed)
Pt/family given discharge instructions, medication lists, follow up appointments, and when to call the doctor.  Pt/family verbalizes understanding. Pt given signs and symptoms of infection. Evo Aderman McClintock, RN    

## 2021-05-26 NOTE — Progress Notes (Addendum)
      [address]       [place] [address]             [phone]        5 Days Post-Op Procedure(s) (LRB): CORONARY ARTERY BYPASS GRAFTING (CABG) X4 ON PUMP USING LEFT INTERNAL MAMMARY ARTERY AND ENDOSCOPICALLY HARVESTED RIGHT GREATER SAPHENOUS VEIN (N/A) RADIAL ARTERY HARVEST (Left) TRANSESOPHAGEAL ECHOCARDIOGRAM (TEE) (N/A) APPLICATION OF CELL SAVER (N/A) ENDOVEIN HARVEST OF GREATER SAPHENOUS VEIN (Right)  Subjective: Patient states she has had decreased appetite since surgery. She denies nausea, vomiting, or abdominal pain. Has had bowel movement.  Objective: Vital signs in last 24 hours: Temp:  [97.6 F (36.4 C)-99.1 F (37.3 C)] 98.5 F (36.9 C) (10/23 0748) Pulse Rate:  [56-78] 56 (10/23 0749) Cardiac Rhythm: Normal sinus rhythm (10/22 1900) Resp:  [16-21] 18 (10/23 0749) BP: (128-139)/(54-93) 139/78 (10/23 0749) SpO2:  [95 %-97 %] 97 % (10/23 0749) Weight:  [77.6 kg] 77.6 kg (10/23 0300)  Pre op weight 75.8 kg Current Weight  05/26/21 77.6 kg      Intake/Output from previous day: 10/22 0701 - 10/23 0700 In: 480 [P.O.:480] Out: -    Physical Exam:  Cardiovascular: RRR Pulmonary: Slightly diminished bibasilar breath sounds Abdomen: Soft, non tender, bowel sounds present. Extremities: Mild bilateral lower extremity edema. Motor/sensory LUE intact Wounds: Sternal and RLE wounds are clean and dry.  No erythema or signs of infection. Upper sternal wound with "lump"-tissue from wound closure.  Lab Results: CBC: Recent Labs    05/24/21 0500  WBC 15.6*  HGB 9.8*  HCT 30.0*  PLT 168    BMET:  Recent Labs    05/24/21 0500  NA 134*  K 4.2  CL 102  CO2 23  GLUCOSE 203*  BUN 12  CREATININE 0.58  CALCIUM 8.3*     PT/INR:  Lab Results  Component Value Date   INR 1.2 05/21/2021   INR 1.0 05/17/2021   ABG:  INR: Will add last result for INR, ABG once components are confirmed Will add last 4 CBG results once components are  confirmed  Assessment/Plan:  1. CV - Previous a fib, ventricular trigeminy. SR with HR in the 60's this am. On Amiodarone 400 mg bid, Amlodipine 2.5 mg daily, Lopressor 25 mg bid, and Midodrine 5 mg tid. Decrease Amiodarone. BP improved so will stop Midodrine. 2.  Pulmonary - On room air. PA/LAT CXR done yesterday showed small right and trace left pleural effusions. Encourage incentive spirometer. 3. Volume Overload - On Lasix 40 mg daily and will continue after discharge 4.  Expected post op acute blood loss anemia - Last H and H stable at 9.8 and 30. 5. DM-CBGs 141/150/124. On Pre op HGA1C 10.3. Will restart Glipizide and Januvia at discharge.Unfortunately, she cannot tolerate Metformin (diarrhea). She will need close medical follow up with medical doctor after discharge.  6. Deconditioned-continue with PT 7. GI-encourage po. Appetite should improve with time. She may have Glucerna to supplement 8. Discharge  Donielle M ZimmermanPA-C 05/26/2021,8:07 AM    Chart reviewed, patient examined, agree with above. She feels well and wants to go home. Ambulating well. Rhythm stable on amiodarone and Lopressor. BP stable so can DC midodrine. I told her she must stop smoking and keep her DM under good control.

## 2021-05-26 NOTE — Discharge Summary (Signed)
Physician Discharge Summary       [address]       [place] [address]             [phone]    Patient ID: Lauren David MRN: [id number] DOB/AGE: 62-May-1962 62 y.o.  Admit date: 05/21/2021 Discharge date: 05/26/2021  Admission Diagnoses: Coronary artery disease Discharge Diagnoses:  1.  S/P CABG x 4 2. Post op atrial fibrillation 3. History of Type 2 diabetes mellitus ([place]) 4. History of hyperlipidemia 5. History of essential hypertension 6. History of COPD (chronic obstructive pulmonary disease) ([place]) 7. History of chronic back pain 8. History of depression 9. History of anxiety  Consults: None  Procedure (s):  CABG X 4, LIMA LAD, left radial artery to diagonal branch, reverse saphenous vein graft to PDA, reverse saphenous vein graft to obtuse marginal Endoscopic greater saphenous vein harvest on the right Open left radial artery harvest by Dr. Kipp Brood on 05/21/2021.  History of Presenting Illness: This is a 60 year old female presents for surgical evaluation of  coronary artery disease.  She has a history of diabetes Poorly controlled), and continues to smoke about a pack and a half of cigarettes a day.  She has been having some exertional anginal symptoms as well as some symptoms at rest.  She underwent an elective left heart cath which identified three-vessel coronary disease. Dr. Kipp Brood discussed the need for coronary artery bypass grafting surgery. Potential risks, benefits, and complications of the surgery were discussed and the patient agreed to proceed. Pre operative carotid duplex US showed a 40-59% right internal carotid artery stenosis and no significant left internal carotid artery stenosis.  Brief Hospital Course:  Patient underwent a CABG x 4 on 05/21/2021. She was transferred from the OR to [facility] ICU in stable condition.  He was extubated using standard post cardiac surgical protocols without difficulty.  She will require aggressive  pulmonary toilet and her inhalers have been reinitiated.  She is also been started on a nicotine patch and Chantix as she has a significant history of tobacco abuse and quit at time of admission.  Chest tubes were kept in place on postop day 1 for moderate drainage.  She was started intraoperatively on amiodarone after having a brief episode of atrial fibrillation and will continue on amiodarone during the postoperative period her renal function has remained within normal limits and she does have some volume overload which will require diuresis.  Her pacing wires were removed without difficulty.  She was weaned off the Insulin drip. She was restarted on Glipizide. She will be restarted on Januvia at discharge. Her pre op HGA1C is 10.3 and she will require close medical follow up after discharge.She was maintaining NSR and stable for transfer to the progressive care unit on 05/23/2021.  On the floor, she continued to slowly progress. We consulted physical therapy for assistance with mobility. Chest tubes were removed on 10/21. She had ventricular trigeminy on after transfer. She was on Amiodarone 400 mg bid and Lopressor 12.5 mg bid. Lopressor was titrated to 25 mg bid. She was still volume overloaded and diuresed accordingly. Her wounds are clean, dry, and healing without signs of infection. Motor/sensory is intact LUE. She is ambulating on room air. Amiodarone was decreased to 200 mg bid on 10/23 as she ws in SR with Hr in the 60's. She did request prescriptions for Nicotine patch (which was given) and Chantix. I instructed her to see PCP about starting Chantix. As discussed with surgeon,  she is felt surgically stable for discharge today.   Latest Vital Signs: Blood pressure 139/78, pulse (!) 56, temperature 98.5 F (36.9 C), resp. rate 18, height 5\' 1"  (1.549 m), weight 77.6 kg, SpO2 94 %.  Physical Exam:  Cardiovascular: RRR Pulmonary: Slightly diminished bibasilar breath sounds Abdomen: Soft, non  tender, bowel sounds present. Extremities: Mild bilateral lower extremity edema. Motor/sensory LUE intact Wounds: Sternal and RLE wounds are clean and dry.  No erythema or signs of infection. Upper sternal wound with "lump"-tissue from wound closure. Discharge Condition:Stable and discharged to home.  Recent laboratory studies:  Lab Results  Component Value Date   WBC 15.6 (H) 05/24/2021   HGB 9.8 (L) 05/24/2021   HCT 30.0 (L) 05/24/2021   MCV 90.1 05/24/2021   PLT 168 05/24/2021   Lab Results  Component Value Date   NA 134 (L) 05/24/2021   K 4.2 05/24/2021   CL 102 05/24/2021   CO2 23 05/24/2021   CREATININE 0.58 05/24/2021   GLUCOSE 203 (H) 05/24/2021      Diagnostic Studies: DG Chest 2 View  Result Date: 05/25/2021 CLINICAL DATA:  Pleural effusions EXAM: CHEST - 2 VIEW COMPARISON:  May 23, 2021 FINDINGS: The cardiomediastinal silhouette is unchanged in contour.Status post median sternotomy and CABG. Removal of RIGHT IJ CVC. Removal multiple chest tubes. Small RIGHT and trace LEFT pleural effusion. No pneumothorax. No acute pleuroparenchymal abnormality. Visualized abdomen is unremarkable. IMPRESSION: Small RIGHT and trace LEFT pleural effusion. Electronically Signed   By: Valentino Saxon M.D.   On: 05/25/2021 13:26   DG Chest 2 View  Result Date: 05/20/2021 CLINICAL DATA:  Abnormal myocardial perfusion scan. History of asthma, COPD, diabetes, hypertension and MI. EXAM: CHEST - 2 VIEW COMPARISON:  12/16/2001 FINDINGS: Normal cardiac silhouette and mediastinal contours. Atherosclerotic plaque when the thoracic aorta. The lungs are hyperexpanded with flattened diaphragms mild diffuse slightly nodular thickening of the pulmonary interstitium. No discrete focal airspace opacities. No pleural effusion or pneumothorax no evidence of edema. No acute osseous abnormalities. IMPRESSION: Findings suggestive of mild lung hyperexpansion and chronic bronchitic change without superimposed  acute cardiopulmonary disease. Electronically Signed   By: Sandi Mariscal M.D.   On: 05/20/2021 08:40   DG Chest Port 1 View  Result Date: 05/23/2021 CLINICAL DATA:  Chest tube present status post open heart surgery. Sore chest EXAM: PORTABLE CHEST 1 VIEW COMPARISON:  Chest radiograph 1 day prior FINDINGS: A right IJ vascular sheath is in stable position. Median sternotomy wires and mediastinal surgical clips are stable. Bibasilar chest tubes and a presumed mediastinal drain are stable. The cardiomediastinal silhouette is stable. Small bilateral pleural effusions are unchanged. There is mild bibasilar atelectasis. There is no new or worsening focal airspace disease. There is no pneumothorax. There is no acute osseous abnormality. IMPRESSION: 1. Stable support devices as above. 2. Unchanged small bilateral pleural effusions. No new or worsening airspace disease. 3. No pneumothorax. Electronically Signed   By: Valetta Mole M.D.   On: 05/23/2021 08:15   DG Chest Port 1 View  Result Date: 05/22/2021 CLINICAL DATA:  Chest tube present s/p open heart urg,very sore chest EXAM: PORTABLE CHEST - 1 VIEW COMPARISON:  the previous day's study FINDINGS: Patient has been extubated and the gastric tube removed. Bilateral chest and mediastinal drains stable. Right IJ central venous catheter to the proximal SVC. Lower lung volumes with mild interstitial prominence and some patchy perihilar infiltrates or atelectasis left greater than right. Heart size normal.  CABG markers. Blunting of  lateral costophrenic angles suggesting small effusions. No pneumothorax. Sternotomy wires. IMPRESSION: Interval extubation with lower lung volumes, new patchy perihilar infiltrates or atelectasis left greater than right. Electronically Signed   By: Lucrezia Europe M.D.   On: 05/22/2021 06:14   DG Chest Port 1 View  Result Date: 05/21/2021 CLINICAL DATA:  Postop open heart surgery. EXAM: PORTABLE CHEST 1 VIEW COMPARISON:  Radiographs 05/17/2021.  FINDINGS: 1421 hours. Tip of the endotracheal tube is approximately 1.1 cm above the carina. Right IJ central venous catheter projects to the level of the upper SVC. Enteric tube projects below the diaphragm, tip over the proximal stomach. Mediastinal drain and bilateral chest tubes are in place. The heart size and mediastinal contours are stable status post interval median sternotomy and CABG. The lungs appear clear. There is no pleural effusion or pneumothorax. No evidence of acute fracture. IMPRESSION: Interval CABG without complication. Tip of the endotracheal tube is low, 11 mm above the carina. Electronically Signed   By: Richardean Sale M.D.   On: 05/21/2021 14:49   ECHO INTRAOPERATIVE TEE  Result Date: 05/21/2021  *INTRAOPERATIVE TRANSESOPHAGEAL REPORT *  Patient Name:   Lauren David Date of Exam: 05/21/2021 Medical Rec #:  [id number]      Height:       61.0 in Accession #:    [id number]     Weight:       167.1 lb Date of Birth:  1961-07-30      BSA:          1.75 m Patient Age:    50 years       BP:           132/60 mmHg Patient Gender: F              HR:           63 bpm. Exam Location:  Anesthesiology Transesophogeal exam was perform intraoperatively during surgical procedure. Patient was closely monitored under general anesthesia during the entirety of examination. Indications:     CAD Native Vessel i25.10 Sonographer:     Raquel Sarna Senior RDCS Performing Phys: [id number] Lucile Crater LIGHTFOOT Diagnosing Phys: Annye Asa MD Complications: No known complications during this procedure. POST-OP IMPRESSIONS Limited Post-CPB exam: The patient separated easily from CPB. _ Left Ventricle: The left ventricular function is unchanged from pre-bypass images. Contractility and wall motion remain normal. Overall EF appears >65%. _ Right Ventricle: The right ventricular function appears normal, unchanged from pre-bypass images. _ Aortic Valve: The aortic valve function appears unchanged from pre-bypass images. _  Mitral Valve: The mitral valve function appears essentially unchanged from pre-bypass images. MR is trivial. _ Tricuspid Valve: The tricuspid valve function appears unchanged from pre-bypass images. PRE-OP FINDINGS  Left Ventricle: The left ventricle has hyperdynamic systolic function, with an ejection fraction of >65%, measured 79%. The cavity size was normal. No evidence of left ventricular regional wall motion abnormalities. There is no left ventricular hypertrophy. Left ventricular diastolic function was not evaluated. Right Ventricle: The right ventricle has normal systolic function. The cavity was normal. There is no increase in right ventricular wall thickness. Left Atrium: Left atrial size was normal in size. No left atrial/left atrial appendage thrombus was detected. Left atrial appendage velocity is normal at greater than 40 cm/s. Right Atrium: Right atrial size was normal in size. Catheter present in the right atrium. Interatrial Septum: No atrial level shunt detected by color flow Doppler. There is no evidence of a patent foramen ovale. Pericardium: Trivial  pericardial effusion is present. Mitral Valve: The mitral valve is normal in structure. Mitral valve regurgitation is not visualized by color flow Doppler. Pulmonary venous flow is normal. There is no evidence of mitral stenosis, with peak gradient 3 mmHg, mean gradient 2 mmHg. Tricuspid Valve: The tricuspid valve was normal in structure. Tricuspid valve regurgitation is trivial by color flow Doppler. No evidence of tricuspid stenosis is present. There is no evidence of tricuspid valve vegetation. Aortic Valve: The aortic valve is tricuspid. Aortic valve regurgitation was not visualized by color flow Doppler. There is no stenosis of the aortic valve, with peak gradient 9 mmHg, mean gradient 6 mmHg. Pulmonic Valve: The pulmonic valve was normal in structure, with normal leaflet mobility. No evidence of pulmonic stenosis. Pulmonic valve regurgitation is  not visualized by color flow Doppler. Aorta: The aortic root, ascending aorta and aortic arch are normal in size and structure. There is evidence of scant scattered plaque in the descending aorta; Grade I, measuring 1-49mm in size. Pulmonary Artery: The pulmonary artery is of normal size. Venous: The inferior vena cava is normal in size with greater than 50% respiratory variability, suggesting right atrial pressure of 3 mmHg. Shunts: There is no evidence of an atrial septal defect. +--------------+-------++ LEFT VENTRICLE        +--------------+-------++ PLAX 2D               +--------------+-------++ LVIDd:        2.4 cm  +--------------+-------++ LVIDs:        1.3 cm  +--------------+-------++ LV PW:        1.90 cm +--------------+-------++ LV IVS:       2.70 cm +--------------+-------++ LV SV:        4.16 ml +--------------+-------++ LV SV Index:          +--------------+-------++                       +--------------+-------++ +-------------+------------++ AORTIC VALVE              +-------------+------------++ AV Vmax:     153.00 cm/s  +-------------+------------++ AV Vmean:    115.000 cm/s +-------------+------------++ AV VTI:      0.360 m      +-------------+------------++ AV Peak Grad:9.4 mmHg     +-------------+------------++ AV Mean Grad:6.0 mmHg     +-------------+------------++ +-------------+---------++ MITRAL VALVE           +-------------+---------++ MV Peak grad:3.5 mmHg  +-------------+---------++ MV Mean grad:2.0 mmHg  +-------------+---------++ MV Vmax:     0.93 m/s  +-------------+---------++ MV Vmean:    59.2 cm/s +-------------+---------++ MV VTI:      0.31 m    +-------------+---------++  Annye Asa MD Electronically signed by Annye Asa MD Signature Date/Time: 05/21/2021/3:50:31 PM    Final    VAS US DOPPLER PRE CABG  Result Date: 05/17/2021 PREOPERATIVE VASCULAR EVALUATION Patient Name:  Lauren David  Date of Exam:   05/17/2021 Medical Rec #: [id number]       Accession #:    [id number] Date of Birth: 1961/04/20       Patient Gender: F Patient Age:   58 years Exam Location:  [facility] Procedure:      VAS US DOPPLER PRE CABG Referring Phys: HARRELL LIGHTFOOT --------------------------------------------------------------------------------  Indications:      Pre-CABG. Risk Factors:     Hypertension, hyperlipidemia, Diabetes, coronary artery  disease. Comparison Study: no prior Performing Technologist: Archie Patten RVS  Examination Guidelines: A complete evaluation includes B-mode imaging, spectral Doppler, color Doppler, and power Doppler as needed of all accessible portions of each vessel. Bilateral testing is considered an integral part of a complete examination. Limited examinations for reoccurring indications may be performed as noted.  Right Carotid Findings: +----------+--------+--------+--------+-------------------------+--------+           PSV cm/sEDV cm/sStenosisDescribe                 Comments +----------+--------+--------+--------+-------------------------+--------+ CCA Prox  78      14              heterogenous                      +----------+--------+--------+--------+-------------------------+--------+ CCA Distal58      12              heterogenous                      +----------+--------+--------+--------+-------------------------+--------+ ICA Prox  144     44      40-59%  heterogenous and calcific         +----------+--------+--------+--------+-------------------------+--------+ ICA Mid   121     36                                                +----------+--------+--------+--------+-------------------------+--------+ ICA Distal117     40                                                +----------+--------+--------+--------+-------------------------+--------+ ECA       251     15                                                 +----------+--------+--------+--------+-------------------------+--------+ +----------+--------+-------+--------+------------+           PSV cm/sEDV cmsDescribeArm Pressure +----------+--------+-------+--------+------------+ Subclavian118                                 +----------+--------+-------+--------+------------+ +---------+--------+--+--------+--+---------+ VertebralPSV cm/s44EDV cm/s14Antegrade +---------+--------+--+--------+--+---------+ Left Carotid Findings: +----------+--------+--------+--------+-------------------------+--------+           PSV cm/sEDV cm/sStenosisDescribe                 Comments +----------+--------+--------+--------+-------------------------+--------+ CCA Prox  86      16              heterogenous                      +----------+--------+--------+--------+-------------------------+--------+ CCA Distal45      14              heterogenous                      +----------+--------+--------+--------+-------------------------+--------+ ICA Prox  86      31      1-39%   heterogenous and calcific         +----------+--------+--------+--------+-------------------------+--------+ ICA Distal96  32                                                +----------+--------+--------+--------+-------------------------+--------+ ECA       282     31                                                +----------+--------+--------+--------+-------------------------+--------+  +----------+--------+--------+--------+------------+ SubclavianPSV cm/sEDV cm/sDescribeArm Pressure +----------+--------+--------+--------+------------+           142                                  +----------+--------+--------+--------+------------+ +---------+--------+--+--------+--+---------+ VertebralPSV cm/s51EDV cm/s10Antegrade +---------+--------+--+--------+--+---------+  ABI Findings:  +--------+------------------+-----+---------+--------+ Right   Rt Pressure (mmHg)IndexWaveform Comment  +--------+------------------+-----+---------+--------+ [id number]                    triphasic         +--------+------------------+-----+---------+--------+ PTA     148               1.12 triphasic         +--------+------------------+-----+---------+--------+ DP      132               1.00 triphasic         +--------+------------------+-----+---------+--------+ +--------+------------------+-----+---------+-------+ Left    Lt Pressure (mmHg)IndexWaveform Comment +--------+------------------+-----+---------+-------+ [id number]                    triphasic        +--------+------------------+-----+---------+-------+ PTA     129               0.98 triphasic        +--------+------------------+-----+---------+-------+ DP      121               0.92 triphasic        +--------+------------------+-----+---------+-------+ +-------+---------------+----------------+ ABI/TBIToday's ABI/TBIPrevious ABI/TBI +-------+---------------+----------------+ Right  1.12                            +-------+---------------+----------------+ Left   0.98                            +-------+---------------+----------------+  Right Doppler Findings: +--------+--------+-----+---------+--------+ Site    PressureIndexDoppler  Comments +--------+--------+-----+---------+--------+ [id number]          triphasic         +--------+--------+-----+---------+--------+ Radial               triphasic         +--------+--------+-----+---------+--------+ Ulnar                triphasic         +--------+--------+-----+---------+--------+  Left Doppler Findings: +--------+--------+-----+---------+--------+ Site    PressureIndexDoppler  Comments +--------+--------+-----+---------+--------+ [id number]          triphasic          +--------+--------+-----+---------+--------+ Radial               triphasic         +--------+--------+-----+---------+--------+ Ulnar  triphasic         +--------+--------+-----+---------+--------+  Summary: Right Carotid: Velocities in the right ICA are consistent with a 40-59%                stenosis. Left Carotid: Velocities in the left ICA are consistent with a 1-39% stenosis. Vertebrals: Bilateral vertebral arteries demonstrate antegrade flow. Right ABI: Resting right ankle-brachial index is within normal range. No evidence of significant right lower extremity arterial disease. Left ABI: Resting left ankle-brachial index is within normal range. No evidence of significant left lower extremity arterial disease. Right Upper Extremity: Doppler waveforms remain within normal limits with right radial compression. Doppler waveforms remain within normal limits with right ulnar compression. Left Upper Extremity: Doppler waveforms remain within normal limits with left radial compression. Doppler waveforms remain within normal limits with left ulnar compression.  Electronically signed by Monica Martinez MD on 05/17/2021 at 2:28:06 PM.    Final        Discharge Instructions     Amb Referral to Cardiac Rehabilitation   Complete by: As directed    Diagnosis: CABG   CABG X ___: 4   After initial evaluation and assessments completed: Virtual Based Care may be provided alone or in conjunction with Phase 2 Cardiac Rehab based on patient barriers.: Yes       Discharge Medications: Allergies as of 05/26/2021       Reactions   Percocet [oxycodone-acetaminophen] Rash        Medication List     STOP taking these medications    aspirin EC 81 MG tablet Replaced by: aspirin 81 MG chewable tablet   bisoprolol 5 MG tablet Commonly known as: ZEBETA   ibuprofen 200 MG tablet Commonly known as: ADVIL   lisinopril 10 MG tablet Commonly known as: ZESTRIL   nitroGLYCERIN 0.4  MG SL tablet Commonly known as: NITROSTAT       TAKE these medications    acetaminophen 500 MG tablet Commonly known as: TYLENOL Take 1,000 mg by mouth every 6 (six) hours as needed for moderate pain or headache.   albuterol (2.5 MG/3ML) 0.083% nebulizer solution Commonly known as: PROVENTIL ONE VIAL BY NEBULIZATION EVERY 6 HOURS AS NEEDED FOR WHEEZING OR SHORTNESS OF BREATH.   albuterol 108 (90 Base) MCG/ACT inhaler Commonly known as: VENTOLIN HFA INHALE 2 PUFFS INTO THE LUNGS EVERY 4 HOURS AS NEEDED FOR WHEEZING OR SHORTNESS OF BREATH.   amiodarone 200 MG tablet Commonly known as: PACERONE Take 1 tablet (200 mg total) by mouth 2 (two) times daily. For one week then take 200 mg daily thereafter   amLODipine 2.5 MG tablet Commonly known as: NORVASC Take 1 tablet (2.5 mg total) by mouth daily. What changed:  medication strength how much to take   aspirin 81 MG chewable tablet Chew 4 tablets (324 mg total) by mouth daily. Replaces: aspirin EC 81 MG tablet   dicyclomine 10 MG capsule Commonly known as: Bentyl Take 1 capsule (10 mg total) by mouth 3 (three) times daily as needed.   furosemide 40 MG tablet Commonly known as: LASIX Take 1 tablet (40 mg total) by mouth 2 (two) times daily. For one week then stop.   glipiZIDE 5 MG tablet Commonly known as: GLUCOTROL Take 1 tablet (5 mg total) by mouth 2 (two) times daily before a meal.   Incruse Ellipta 62.5 MCG/ACT Aepb Generic drug: umeclidinium bromide Inhale 1 puff into the lungs daily.   metoprolol tartrate 25 MG tablet Commonly known as: LOPRESSOR  Take 1 tablet (25 mg total) by mouth 2 (two) times daily.   nicotine 14 mg/24hr patch Commonly known as: NICODERM CQ - dosed in mg/24 hours Place 1 patch (14 mg total) onto the skin daily.   potassium chloride SA 20 MEQ tablet Commonly known as: KLOR-CON Take 1 tablet (20 mEq total) by mouth daily. For one week then stop.   rosuvastatin 20 MG tablet Commonly  known as: CRESTOR Take 1 tablet (20 mg total) by mouth daily. What changed:  medication strength how much to take   sitaGLIPtin 100 MG tablet Commonly known as: JANUVIA Take 1 tablet (100 mg total) by mouth daily.   traMADol 50 MG tablet Commonly known as: ULTRAM Take 1 tablet (50 mg total) by mouth every 6 (six) hours as needed for moderate pain.   varenicline 0.5 MG tablet Commonly known as: Chantix Take 1 tablet (0.5 mg total) by mouth 2 (two) times daily. What changed: when to take this        Follow Up Appointments:  Follow-up Information     Lajuana Matte, MD Follow up on 05/31/2021.   Specialty: Cardiothoracic Surgery Why: Appointment is via TELEPHONE. Please do NOT go to the office. Appointment time is at 2:40 pm Contact information: [address] [place] [address] [phone]         Lindell Spar, MD. Call.   Specialty: Internal Medicine Why: Please call for a follow up appointment for 2 weeks regarding diabetes management and further surveillance of HGA1C 10.3. Also, patient interested in starting Chantix and requests prescription Contact information: [place] [address] [phone]         Verta Ellen., NP Follow up.   Specialty: Cardiology Why: Appointment time is at 9:30 am Contact information: [address] [place] [address] [place] [facility].   Specialty: Home Health Services Contact information: [place] [address] [phone]                 Signed: Sharalyn Ink [facility] 05/26/2021, 10:03 AM

## 2021-05-28 ENCOUNTER — Telehealth: Payer: Self-pay

## 2021-05-28 ENCOUNTER — Telehealth: Payer: Self-pay | Admitting: *Deleted

## 2021-05-28 DIAGNOSIS — Z48812 Encounter for surgical aftercare following surgery on the circulatory system: Secondary | ICD-10-CM | POA: Diagnosis not present

## 2021-05-28 NOTE — Telephone Encounter (Signed)
Transition Care Management Unsuccessful Follow-up Telephone Call  Date of discharge and from where:  05/26/2021 [facility]  Diagnosis: CAD, S/P CABG x 4   Attempts:  2nd Attempt  Reason for unsuccessful TCM follow-up call:  Left voice message

## 2021-05-28 NOTE — Telephone Encounter (Signed)
Transition Care Management Unsuccessful Follow-up Telephone Call  Date of discharge and from where:  05-26-21 [facility]  Attempts:  1st Attempt  Reason for unsuccessful TCM follow-up call:  Left voice message

## 2021-05-29 ENCOUNTER — Telehealth: Payer: Self-pay

## 2021-05-29 NOTE — Telephone Encounter (Signed)
Transition Care Management Follow-up Telephone Call Date of discharge and from where: 05/26/21 [facility] Diagnosis: CAD, S/P CABG x 4 How have you been since you were released from the hospital? Pt states she is doing well.  Any questions or concerns? No  Items Reviewed: Did the pt receive and understand the discharge instructions provided? Yes  Medications obtained and verified? Yes  Other? No  Any new allergies since your discharge? No  Dietary orders reviewed? Yes Do you have support at home? Yes   Home Care and Equipment/Supplies: Were home health services ordered? yes If so, what is the name of the agency? [facility]  Has the agency set up a time to come to the patient's home? yes Were any new equipment or medical supplies ordered?  No What is the name of the medical supply agency? N/a Were you able to get the supplies/equipment? no Do you have any questions related to the use of the equipment or supplies? No  Functional Questionnaire: (I = Independent and D = Dependent) ADLs: I  Bathing/Dressing- I  Meal Prep- I  Eating- I  Maintaining continence- I  Transferring/Ambulation- I  Managing Meds- I  Follow up appointments reviewed:  PCP Hospital f/u appt confirmed? Yes  Scheduled to see Dr. Posey Pronto on 06/06/2021 @ 11:40. [facility] f/u appt confirmed? Yes  Scheduled to see Dr. Leonides Sake on 06/10/21 @ 9:30. Are transportation arrangements needed? No  If their condition worsens, is the pt aware to call PCP or go to the Emergency Dept.? Y  Was the patient provided with contact information for the PCP's office or ED? Yes Was to pt encouraged to call back with questions or concerns? Yes

## 2021-05-31 ENCOUNTER — Other Ambulatory Visit: Payer: Self-pay

## 2021-05-31 ENCOUNTER — Ambulatory Visit (INDEPENDENT_AMBULATORY_CARE_PROVIDER_SITE_OTHER): Payer: Self-pay | Admitting: Thoracic Surgery (Cardiothoracic Vascular Surgery)

## 2021-05-31 DIAGNOSIS — I2510 Atherosclerotic heart disease of native coronary artery without angina pectoris: Secondary | ICD-10-CM

## 2021-05-31 MED ORDER — METOPROLOL TARTRATE 25 MG PO TABS: 12.5000 mg | ORAL_TABLET | Freq: Two times a day (BID) | ORAL | 1 refills | Status: DC

## 2021-05-31 NOTE — Progress Notes (Signed)
     [address]       [place] [address]             [phone]       Patient: Home Provider: Office Consent for Telemedicine visit obtained.  Today's visit was completed via a real-time telehealth (see specific modality noted below). The patient/authorized person provided oral consent at the time of the visit to engage in a telemedicine encounter with the present provider at [facility]. The patient/authorized person was informed of the potential benefits, limitations, and risks of telemedicine. The patient/authorized person expressed understanding that the laws that protect confidentiality also apply to telemedicine. The patient/authorized person acknowledged understanding that telemedicine does not provide emergency services and that he or she would need to call 911 or proceed to the nearest hospital for help if such a need arose.   Total time spent in the clinical discussion 10 minutes.  Telehealth Modality: Phone visit (audio only)  I had a telephone visit with Lauren David.  She has had some lightheadedness.  Her blood pressures been low with systolics in the 993 and heart rates in the 40s.  I instructed her to have her dose Lopressor all to 12.5mg  and have placed a new prescription for that.  Otherwise she is doing well.  She denies any pain and has not been using her tramadol.  She is ambulating without much difficulty.  We will see her back in clinic in 1 month with a chest x-ray.  If all that looks clear she can be discharged to cardiac rehab and resume all normal activity.

## 2021-06-06 ENCOUNTER — Encounter: Payer: Self-pay | Admitting: Internal Medicine

## 2021-06-06 ENCOUNTER — Ambulatory Visit (INDEPENDENT_AMBULATORY_CARE_PROVIDER_SITE_OTHER): Payer: Medicare Other | Admitting: Internal Medicine

## 2021-06-06 ENCOUNTER — Other Ambulatory Visit: Payer: Self-pay

## 2021-06-06 ENCOUNTER — Other Ambulatory Visit: Payer: Self-pay | Admitting: *Deleted

## 2021-06-06 VITALS — BP 107/60 | HR 48 | Resp 18 | Ht 61.0 in | Wt 161.0 lb

## 2021-06-06 DIAGNOSIS — I10 Essential (primary) hypertension: Secondary | ICD-10-CM

## 2021-06-06 DIAGNOSIS — E1151 Type 2 diabetes mellitus with diabetic peripheral angiopathy without gangrene: Secondary | ICD-10-CM

## 2021-06-06 DIAGNOSIS — Z951 Presence of aortocoronary bypass graft: Secondary | ICD-10-CM | POA: Diagnosis not present

## 2021-06-06 DIAGNOSIS — L03114 Cellulitis of left upper limb: Secondary | ICD-10-CM

## 2021-06-06 DIAGNOSIS — Z09 Encounter for follow-up examination after completed treatment for conditions other than malignant neoplasm: Secondary | ICD-10-CM

## 2021-06-06 MED ORDER — CEPHALEXIN 500 MG PO CAPS: 500.0000 mg | ORAL_CAPSULE | Freq: Three times a day (TID) | ORAL | 0 refills | Status: DC

## 2021-06-06 MED ORDER — EMPAGLIFLOZIN 10 MG PO TABS: 10.0000 mg | ORAL_TABLET | Freq: Every day | ORAL | 2 refills | Status: DC

## 2021-06-06 MED ORDER — BLOOD GLUCOSE METER KIT: PACK | 0 refills | Status: AC

## 2021-06-06 NOTE — Assessment & Plan Note (Addendum)
Lab Results  Component Value Date   HGBA1C 10.3 (H) 05/17/2021   Uncontrolled due to noncompliance ON Januvia 100 mg QD Does not tolerate Metformin, has severe diarrhea On Glipizide, added Jardiance 10 mg daily for added cardiac and renal benefit Advised to follow diabetic diet F/u CMP and lipid panel in the next visit Diabetic eye exam: Advised to follow up with Ophthalmology for diabetic eye exam

## 2021-06-06 NOTE — Patient Instructions (Addendum)
Please start taking Jardiance as prescribed.  Please continue taking medications as prescribed.  Please continue to follow low carb diet and walk as tolerated.

## 2021-06-06 NOTE — Assessment & Plan Note (Signed)
BP Readings from Last 1 Encounters:  06/06/21 107/60   Well-controlled with amlodipine, metoprolol and Lasix Counseled for compliance with the medications Advised DASH diet and walking as tolerated

## 2021-06-06 NOTE — Assessment & Plan Note (Signed)
Started Keflex considering she has uncontrolled DM and at higher risk of infection

## 2021-06-06 NOTE — Assessment & Plan Note (Signed)
On aspirin and statin Followed by thoracic surgery and cardiology No chest pain or dyspnea currently

## 2021-06-06 NOTE — Progress Notes (Addendum)
Established Patient Office Visit  Subjective:  Patient ID: Lauren David, female    DOB: [birth year]  Age: 60 y.o. MRN: [id number]  CC:  Chief Complaint  Patient presents with  . Transitions Of Care    Pt was discharged 05-26-21 had quadruple bypass surgery pt feels ok since being home     HPI Lauren David is a 60 y.o. female with past medical history of CAD s/p CABG, HTN, COPD, type 2 DM, Osteopenia, chronic left hip pain and tobacco abuse who presents for hospital discharge follow-up.  She recently had a CABG for CAD on 10/18.  She tolerated the procedure well.  She was transferred to ICU from the OR and was extubated after the procedure without any complication.  She had an episode of A. fib during the hospital course, and has been placed on amiodarone.  Her dose of metoprolol was reduced recently due to low BP and bradycardia.  She has been doing well now.  Denies any chest pain, dyspnea or palpitations.  She is going to follow-up with her thoracic surgeon and cardiologist later in the month.  She complains of redness and mild pain over left forearm, from where the graft was taken.  She has quit smoking and has been using nicotine patch and Chantix now.  She uses Incruse regularly.  She has not been requiring Albuterol too frequently now.  Denies any dyspnea or wheezing currently.  She admits that she was not taking glipizide and Januvia before the procedure, but has been taking it regularly now.  Her last HbA1C was  10.3 in 05/2021. Past Medical History:  Diagnosis Date  . Anxiety   . Asthma   . CAD (coronary artery disease)    Multivessel disease 05/2018  . Chronic back pain   . COPD (chronic obstructive pulmonary disease) ([facility])   . Depression   . Dyspnea   . Essential hypertension   . Headache   . Hyperlipidemia   . Myocardial infarction ([place])   . Pneumonia   . PONV (postoperative nausea and vomiting)   . Type 2 diabetes mellitus ([place])     Past Surgical History:   Procedure Laterality Date  . BREAST BIOPSY Left   . COLONOSCOPY N/A 04/26/2018   Procedure: COLONOSCOPY;  Surgeon: Rogene Houston, MD;  [facility];  Service: Endoscopy;  Laterality: N/A;  1:15  . CORONARY ARTERY BYPASS GRAFT N/A 05/21/2021   Procedure: CORONARY ARTERY BYPASS GRAFTING (CABG) X4 ON PUMP USING LEFT INTERNAL MAMMARY ARTERY AND ENDOSCOPICALLY HARVESTED RIGHT GREATER SAPHENOUS VEIN;  Surgeon: Lajuana Matte, MD;  Location: [facility];  Service: Open Heart Surgery;  Laterality: N/A;  . ENDOVEIN HARVEST OF GREATER SAPHENOUS VEIN Right 05/21/2021   Procedure: ENDOVEIN HARVEST OF GREATER SAPHENOUS VEIN;  Surgeon: Lajuana Matte, MD;  Location: [facility];  Service: Open Heart Surgery;  Laterality: Right;  . INTRAVASCULAR PRESSURE WIRE/FFR STUDY N/A 05/12/2018   Procedure: INTRAVASCULAR PRESSURE WIRE/FFR STUDY;  Surgeon: Nelva Bush, MD;  Location: [facility];  Service: Cardiovascular;  Laterality: N/A;  . INTRAVASCULAR PRESSURE WIRE/FFR STUDY N/A 04/22/2021   Procedure: INTRAVASCULAR PRESSURE WIRE/FFR STUDY;  Surgeon: Nelva Bush, MD;  Location: [facility];  Service: Cardiovascular;  Laterality: N/A;  . LEFT HEART CATH AND CORONARY ANGIOGRAPHY N/A 05/12/2018   Procedure: LEFT HEART CATH AND CORONARY ANGIOGRAPHY;  Surgeon: Nelva Bush, MD;  Location: [facility];  Service: Cardiovascular;  Laterality: N/A;  . LEFT HEART CATH  AND CORONARY ANGIOGRAPHY N/A 04/22/2021   Procedure: LEFT HEART CATH AND CORONARY ANGIOGRAPHY;  Surgeon: Nelva Bush, MD;  Location: [facility];  Service: Cardiovascular;  Laterality: N/A;  . POLYPECTOMY  04/26/2018   Procedure: POLYPECTOMY;  Surgeon: Rogene Houston, MD;  [facility];  Service: Endoscopy;;  colon   . RADIAL ARTERY HARVEST Left 05/21/2021   Procedure: RADIAL ARTERY HARVEST;  Surgeon: Lajuana Matte, MD;  Location: [facility];  Service: Open Heart Surgery;  Laterality: Left;   . TEE WITHOUT CARDIOVERSION N/A 05/21/2021   Procedure: TRANSESOPHAGEAL ECHOCARDIOGRAM (TEE);  Surgeon: Lajuana Matte, MD;  Location: [facility];  Service: Open Heart Surgery;  Laterality: N/A;  . TOTAL VAGINAL HYSTERECTOMY      Family History  Problem Relation Age of Onset  . Stroke Father   . Hypertension Father   . Breast cancer Paternal Aunt   . Breast cancer Paternal Aunt   . Breast cancer Paternal Grandmother     Social History   Socioeconomic History  . Marital status: Married    Spouse name: Not on file  . Number of children: Not on file  . Years of education: Not on file  . Highest education level: Not on file  Occupational History  . Not on file  Tobacco Use  . Smoking status: Former    Packs/day: 1.00    Types: Cigarettes  . Smokeless tobacco: Never  . Tobacco comments:    cut back to 1 pack a day. Smokig x 38yr. Did smoke 2 packs x 5 yrs, but cut back a month ago.   Vaping Use  . Vaping Use: Never used  Substance and Sexual Activity  . Alcohol use: Never  . Drug use: Never  . Sexual activity: Not on file  Other Topics Concern  . Not on file  Social History Narrative  . Not on file   Social Determinants of Health   Financial Resource Strain: Low Risk   . Difficulty of Paying Living Expenses: Not hard at all  Food Insecurity: No Food Insecurity  . Worried About RCharity fundraiserin the Last Year: Never true  . Ran Out of Food in the Last Year: Never true  Transportation Needs: No Transportation Needs  . Lack of Transportation (Medical): No  . Lack of Transportation (Non-Medical): No  Physical Activity: Insufficiently Active  . Days of Exercise per Week: 3 days  . Minutes of Exercise per Session: 30 min  Stress: No Stress Concern Present  . Feeling of Stress : Only a little  Social Connections: Moderately Isolated  . Frequency of Communication with Friends and Family: More than three times a week  . Frequency of Social Gatherings with Friends  and Family: More than three times a week  . Attends Religious Services: More than 4 times per year  . Active Member of Clubs or Organizations: No  . Attends CArchivistMeetings: Never  . Marital Status: Separated  Intimate Partner Violence: Not At Risk  . Fear of Current or Ex-Partner: No  . Emotionally Abused: No  . Physically Abused: No  . Sexually Abused: No    Outpatient Medications Prior to Visit  Medication Sig Dispense Refill  . acetaminophen (TYLENOL) 500 MG tablet Take 1,000 mg by mouth every 6 (six) hours as needed for moderate pain or headache.    . albuterol (PROVENTIL) (2.5 MG/3ML) 0.083% nebulizer solution ONE VIAL BY NEBULIZATION EVERY 6 HOURS AS NEEDED FOR WHEEZING OR  SHORTNESS OF BREATH. (Patient taking differently: Take 2.5 mg by nebulization every 6 (six) hours as needed for wheezing.) 180 mL 0  . albuterol (VENTOLIN HFA) 108 (90 Base) MCG/ACT inhaler INHALE 2 PUFFS INTO THE LUNGS EVERY 4 HOURS AS NEEDED FOR WHEEZING OR SHORTNESS OF BREATH. (Patient taking differently: Inhale 1-2 puffs into the lungs every 4 (four) hours as needed for shortness of breath.) 17 g 0  . aspirin 81 MG chewable tablet Chew 4 tablets (324 mg total) by mouth daily.    Marland Kitchen dicyclomine (BENTYL) 10 MG capsule Take 1 capsule (10 mg total) by mouth 3 (three) times daily as needed. (Patient taking differently: Take 10 mg by mouth 3 (three) times daily as needed for spasms.) 60 capsule 1  . furosemide (LASIX) 40 MG tablet Take 1 tablet (40 mg total) by mouth 2 (two) times daily. For one week then stop. (Patient not taking: Reported on 08/09/2021) 7 tablet 0  . glipiZIDE (GLUCOTROL) 5 MG tablet Take 1 tablet (5 mg total) by mouth 2 (two) times daily before a meal. 60 tablet 3  . metoprolol tartrate (LOPRESSOR) 25 MG tablet Take 0.5 tablets (12.5 mg total) by mouth 2 (two) times daily. 60 tablet 1  . nicotine (NICODERM CQ - DOSED IN MG/24 HOURS) 14 mg/24hr patch Place 1 patch (14 mg total) onto the skin  daily. 28 patch 1  . traMADol (ULTRAM) 50 MG tablet Take 1 tablet (50 mg total) by mouth every 6 (six) hours as needed for moderate pain. (Patient not taking: Reported on 08/09/2021) 30 tablet 0  . umeclidinium bromide (INCRUSE ELLIPTA) 62.5 MCG/INH AEPB Inhale 1 puff into the lungs daily. 30 each 2  . varenicline (CHANTIX) 0.5 MG tablet Take 1 tablet (0.5 mg total) by mouth 2 (two) times daily. (Patient taking differently: Take 1 mg by mouth 2 (two) times daily.) 60 tablet 0  . amiodarone (PACERONE) 200 MG tablet Take 1 tablet (200 mg total) by mouth 2 (two) times daily. For one week then take 200 mg daily thereafter 60 tablet 0  . amLODipine (NORVASC) 2.5 MG tablet Take 1 tablet (2.5 mg total) by mouth daily. 35 tablet 0  . potassium chloride SA (KLOR-CON) 20 MEQ tablet Take 1 tablet (20 mEq total) by mouth daily. For one week then stop. 7 tablet 0  . rosuvastatin (CRESTOR) 20 MG tablet Take 1 tablet (20 mg total) by mouth daily. 30 tablet 1  . sitaGLIPtin (JANUVIA) 100 MG tablet Take 1 tablet (100 mg total) by mouth daily. 90 tablet 0   No facility-administered medications prior to visit.    Allergies  Allergen Reactions  . Percocet [Oxycodone-Acetaminophen] Rash    ROS Review of Systems  Constitutional:  Negative for chills and fever.  HENT:  Negative for congestion, sinus pressure, sinus pain and sore throat.   Eyes:  Negative for pain and discharge.  Respiratory:  Negative for cough, shortness of breath and wheezing.   Cardiovascular:  Negative for chest pain and palpitations.  Gastrointestinal:  Negative for abdominal pain, diarrhea, nausea and vomiting.  Endocrine: Negative for polydipsia and polyuria.  Genitourinary:  Negative for dysuria and hematuria.  Musculoskeletal:  Positive for arthralgias and back pain. Negative for neck pain and neck stiffness.  Skin:  Positive for wound.  Neurological:  Negative for dizziness and weakness.  Psychiatric/Behavioral:  Negative for  agitation and behavioral problems.      Objective:    Physical Exam Vitals reviewed.  Constitutional:  General: She is not in acute distress.    Appearance: She is not diaphoretic.  HENT:     Head: Normocephalic and atraumatic.     Nose: Nose normal.     Mouth/Throat:     Mouth: Mucous membranes are moist.  Eyes:     General: No scleral icterus.    Extraocular Movements: Extraocular movements intact.  Cardiovascular:     Rate and Rhythm: Normal rate and regular rhythm.     Pulses: Normal pulses.     Heart sounds: Normal heart sounds. No murmur heard. Pulmonary:     Breath sounds: No wheezing or rales.  Abdominal:     Palpations: Abdomen is soft.     Tenderness: There is no abdominal tenderness.  Musculoskeletal:     Cervical back: Neck supple. No tenderness.     Right lower leg: No edema.     Left lower leg: No edema.  Skin:    General: Skin is warm.     Comments: Incision site erythema and mild tenderness over left forearm, no discharge Small hematoma around right forearm from arterial puncture  Neurological:     General: No focal deficit present.     Mental Status: She is alert and oriented to person, place, and time.  Psychiatric:        Mood and Affect: Mood normal.        Behavior: Behavior normal.    BP 107/60 (BP Location: Left Arm, Patient Position: Sitting, Cuff Size: Normal)   Pulse (!) 48   Resp 18   Ht 5' 1" (1.549 m)   Wt 161 lb (73 kg)   SpO2 98%   BMI 30.42 kg/m  Wt Readings from Last 3 Encounters:  09/16/21 170 lb 6.7 oz (77.3 kg)  09/02/21 166 lb 14.2 oz (75.7 kg)  08/19/21 164 lb 14.5 oz (74.8 kg)    Lab Results  Component Value Date   TSH 1.380 01/24/2021   Lab Results  Component Value Date   WBC 15.6 (H) 05/24/2021   HGB 9.8 (L) 05/24/2021   HCT 30.0 (L) 05/24/2021   MCV 90.1 05/24/2021   PLT 168 05/24/2021   Lab Results  Component Value Date   NA 134 (L) 05/24/2021   K 4.2 05/24/2021   CO2 23 05/24/2021   GLUCOSE 203  (H) 05/24/2021   BUN 12 05/24/2021   CREATININE 0.58 05/24/2021   BILITOT 0.3 05/17/2021   ALKPHOS 78 05/17/2021   AST 20 05/17/2021   ALT 21 05/17/2021   PROT 6.7 05/17/2021   ALBUMIN 3.6 05/17/2021   CALCIUM 8.3 (L) 05/24/2021   ANIONGAP 9 05/24/2021   EGFR 100 01/24/2021   Lab Results  Component Value Date   CHOL 105 05/22/2021   Lab Results  Component Value Date   HDL 27 (L) 05/22/2021   Lab Results  Component Value Date   LDLCALC 64 05/22/2021   Lab Results  Component Value Date   TRIG 72 05/22/2021   Lab Results  Component Value Date   CHOLHDL 3.9 05/22/2021   Lab Results  Component Value Date   HGBA1C 10.3 (H) 05/17/2021      Assessment & Plan:   Hospital discharge follow-up Hospital chart reviewed, including discharge summary Medications reconciled and reviewed with the patient On amiodarone and metoprolol for the postop period On aspirin and statin  S/P CABG x 4 On aspirin and statin Followed by thoracic surgery and cardiology No chest pain or dyspnea currently  Primary hypertension BP  Readings from Last 1 Encounters:  06/06/21 107/60   Well-controlled with amlodipine, metoprolol and Lasix Counseled for compliance with the medications Advised DASH diet and walking as tolerated  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) Lab Results  Component Value Date   HGBA1C 10.3 (H) 05/17/2021   Uncontrolled due to noncompliance ON Januvia 100 mg QD Does not tolerate Metformin, has severe diarrhea On Glipizide, added Jardiance 10 mg daily for added cardiac and renal benefit Advised to follow diabetic diet F/u CMP and lipid panel in the next visit Diabetic eye exam: Advised to follow up with Ophthalmology for diabetic eye exam  Cellulitis of left upper extremity Started Keflex considering she has uncontrolled DM and at higher risk of infection    Meds ordered this encounter  Medications  . empagliflozin (JARDIANCE)  10 MG TABS tablet    Sig: Take 1 tablet (10 mg total) by mouth daily before breakfast.    Dispense:  30 tablet    Refill:  2  . DISCONTD: cephALEXin (KEFLEX) 500 MG capsule    Sig: Take 1 capsule (500 mg total) by mouth 3 (three) times daily.    Dispense:  15 capsule    Refill:  0    Follow-up: Return in about 3 months (around 09/06/2021).    Lindell Spar, MD

## 2021-06-06 NOTE — Assessment & Plan Note (Signed)
Hospital chart reviewed, including discharge summary Medications reconciled and reviewed with the patient On amiodarone and metoprolol for the postop period On aspirin and statin

## 2021-06-09 NOTE — Progress Notes (Addendum)
Cardiology Office Note  Date: 06/10/2021   ID: Lauren David, Lauren David, Lauren David  PCP:  Lauren Spar, MD  Cardiologist:  Lauren Lesches, MD Electrophysiologist:  None   Chief Complaint: Discuss cardiac catheterization risk and benefits  History of Present Illness: Lauren David is a 60 y.o. female with a history of CAD, COPD, HTN, HLD, DM2.   She was last seen by Lauren David 05/25/2020 for routine visit.  She reported fatigue, back pain and pleuritic chest discomfort with hip pain.  She had not yet established with a PCP.  Her medications were reviewed and she needed refills.  Lauren David agreed to refill her diabetes medications until she found the PCP.  He arranged follow-up lab work. She had moderate multivessel CAD being managed medically.  EKG was normal that day.  She was continuing aspirin, Norvasc, Zebeta, lisinopril and Pravachol.  She also had as needed nitroglycerin available.  He ordered FLP's and LFTs.  Blood pressure control was reasonable that day.  Plans to check a basic metabolic panel.  She was provided with phone numbers for PCPs in the area for her to make contact and establish in the future.  Her labs on 01/24/2021 showed elevated glucose of 272.  BUN of 7, creatinine of 0.65, total cholesterol 251, HDL 48, triglycerides 131, LDL 179, vitamin D 9.1, CBC unremarkable, hemoglobin A1c 11.7 with estimated average glucose of 289, TSH of 1.38  Was previously here for 66-monthfollow-up.  She recently established with Dr. PPosey Prontoat RMancelonaprimary care and saw him on March 11, 2021.  He ordered a CMP and lipid panel.  Her diabetes was under controlled.  Recent hemoglobin A1c was 11.7.  Per Dr. PPosey Prontoblood sugar was uncontrolled due to noncompliance.  She was on Januvia 100 mg daily.  She was intolerant of metformin due to diarrhea.  He had started her on glipizide.  She was on ACE inhibitor.  Her blood pressure was elevated at 155/78 during that visit.  Blood  pressure was elevated at 158/72.  She did  not check her blood pressures at home.  She has been having some chest pain and some lower back pain.  She denies any radiation to neck, arm, or jaw.  She denies any nausea, vomiting, or sweating.   She was last here for follow-up of recent stress test.  After speaking with her primary cardiologist he is recommending she undergo a cardiac catheterization given recent increase in chest pain and previous history of CAD on cardiac catheterization October 2019.  She had a cardiac catheterization in October 2019 which demonstrated moderate to severe three-vessel CAD including 50% proximal LAD, 70% mid circumflex, and 60% mid RCA stenosis.  FFR of LAD and RCA lesion was borderline [facility] 0.8-0.81 mid/distal circumflex was small and supplied a relatively small territory.  Dr. ESaunders Revelmentioned under his recommendation after cardiac catheterization if she had persistent symptoms and/or worsening of her coronary artery disease, surgical consultation would need to be considered given three-vessel disease involving proximal LAD and patient's history of diabetes.  He did state he was not sure of the distal LCx was graft doubled due to its relatively small size.   She underwent cardiac catheterization on 04/22/2021 demonstrating severe three-vessel disease.  She was recommended for CABG.  She underwent CABG x4 on 05/21/2021.  She is here for hospital follow-up status post CABG x4 with (LIMA-LAD, left radial-diagonal, reverse SVG to PDA, reverse SVG to OM) on  05/21/2021 Lauren David.  Endoscopic GSV harvesting right side, open left radial artery harvest.  She denies any recent issues since bypass surgery.  Her left internal mammary artery harvesting site and right saphenous vein grafting site and median sternotomy are healing well.  She states the only problem she is having is some minor discomfort with the incision sites.  She states she has been up and walking some and has been  doing well.  She denies any anginal or exertional symptoms.  States her breathing is much better.  Denies any chest pain as she was having prior.  Denies any orthostatic symptoms.  No palpitations or arrhythmias, TIA or CVA-like symptoms, PND, orthopnea, bleeding.  No claudication-like symptoms, DVT or PE-like symptoms, or lower extremity edema.   Past Medical History:  Diagnosis Date   Anxiety    Asthma    CAD (coronary artery disease)    Multivessel disease 05/2018   Chronic back pain    COPD (chronic obstructive pulmonary disease) (HCC)    Depression    Dyspnea    Essential hypertension    Headache    Hyperlipidemia    Myocardial infarction (HCC)    Pneumonia    PONV (postoperative nausea and vomiting)    Type 2 diabetes mellitus ([place])     Past Surgical History:  Procedure Laterality Date   BREAST BIOPSY Left    COLONOSCOPY N/A 04/26/2018   Procedure: COLONOSCOPY;  Surgeon: Rogene Houston, MD;  [facility];  Service: Endoscopy;  Laterality: N/A;  1:15   CORONARY ARTERY BYPASS GRAFT N/A 05/21/2021   Procedure: CORONARY ARTERY BYPASS GRAFTING (CABG) X4 ON PUMP USING LEFT INTERNAL MAMMARY ARTERY AND ENDOSCOPICALLY HARVESTED RIGHT GREATER SAPHENOUS VEIN;  Surgeon: Lajuana Matte, MD;  Location: [place];  Service: Open Heart Surgery;  Laterality: N/A;   ENDOVEIN HARVEST OF GREATER SAPHENOUS VEIN Right 05/21/2021   Procedure: ENDOVEIN HARVEST OF GREATER SAPHENOUS VEIN;  Surgeon: Lajuana Matte, MD;  Location: [place];  Service: Open Heart Surgery;  Laterality: Right;   INTRAVASCULAR PRESSURE WIRE/FFR STUDY N/A 05/12/2018   Procedure: INTRAVASCULAR PRESSURE WIRE/FFR STUDY;  Surgeon: Nelva Bush, MD;  Location: [place] [facility];  Service: Cardiovascular;  Laterality: N/A;   INTRAVASCULAR PRESSURE WIRE/FFR STUDY N/A 04/22/2021   Procedure: INTRAVASCULAR PRESSURE WIRE/FFR STUDY;  Surgeon: Nelva Bush, MD;  Location: [place] [facility];  Service:  Cardiovascular;  Laterality: N/A;   LEFT HEART CATH AND CORONARY ANGIOGRAPHY N/A 05/12/2018   Procedure: LEFT HEART CATH AND CORONARY ANGIOGRAPHY;  Surgeon: Nelva Bush, MD;  Location: [place] [facility];  Service: Cardiovascular;  Laterality: N/A;   LEFT HEART CATH AND CORONARY ANGIOGRAPHY N/A 04/22/2021   Procedure: LEFT HEART CATH AND CORONARY ANGIOGRAPHY;  Surgeon: Nelva Bush, MD;  Location: [place] [facility];  Service: Cardiovascular;  Laterality: N/A;   POLYPECTOMY  04/26/2018   Procedure: POLYPECTOMY;  Surgeon: Rogene Houston, MD;  [facility];  Service: Endoscopy;;  colon    RADIAL ARTERY HARVEST Left 05/21/2021   Procedure: RADIAL ARTERY HARVEST;  Surgeon: Lajuana Matte, MD;  Location: [place];  Service: Open Heart Surgery;  Laterality: Left;   TEE WITHOUT CARDIOVERSION N/A 05/21/2021   Procedure: TRANSESOPHAGEAL ECHOCARDIOGRAM (TEE);  Surgeon: Lajuana Matte, MD;  Location: [place];  Service: Open Heart Surgery;  Laterality: N/A;   TOTAL VAGINAL HYSTERECTOMY      Current Outpatient Medications  Medication Sig Dispense Refill   acetaminophen (TYLENOL) 500 MG tablet  Take 1,000 mg by mouth every 6 (six) hours as needed for moderate pain or headache.     albuterol (PROVENTIL) (2.5 MG/3ML) 0.083% nebulizer solution ONE VIAL BY NEBULIZATION EVERY 6 HOURS AS NEEDED FOR WHEEZING OR SHORTNESS OF BREATH. 180 mL 0   albuterol (VENTOLIN HFA) 108 (90 Base) MCG/ACT inhaler INHALE 2 PUFFS INTO THE LUNGS EVERY 4 HOURS AS NEEDED FOR WHEEZING OR SHORTNESS OF BREATH. 17 g 0   amiodarone (PACERONE) 200 MG tablet Take 1 tablet (200 mg total) by mouth 2 (two) times daily. For one week then take 200 mg daily thereafter 60 tablet 0   amLODipine (NORVASC) 2.5 MG tablet Take 1 tablet (2.5 mg total) by mouth daily. 35 tablet 0   aspirin 81 MG chewable tablet Chew 4 tablets (324 mg total) by mouth daily.     blood glucose meter kit and supplies Dispense based on patient and  insurance preference. Use up to four times daily as directed. (FOR ICD-10 E10.9, E11.9). 1 each 0   cephALEXin (KEFLEX) 500 MG capsule Take 1 capsule (500 mg total) by mouth 3 (three) times daily. 15 capsule 0   dicyclomine (BENTYL) 10 MG capsule Take 1 capsule (10 mg total) by mouth 3 (three) times daily as needed. 60 capsule 1   empagliflozin (JARDIANCE) 10 MG TABS tablet Take 1 tablet (10 mg total) by mouth daily before breakfast. 30 tablet 2   furosemide (LASIX) 40 MG tablet Take 1 tablet (40 mg total) by mouth 2 (two) times daily. For one week then stop. 7 tablet 0   glipiZIDE (GLUCOTROL) 5 MG tablet Take 1 tablet (5 mg total) by mouth 2 (two) times daily before a meal. 60 tablet 3   metoprolol tartrate (LOPRESSOR) David MG tablet Take 0.5 tablets (12.5 mg total) by mouth 2 (two) times daily. 60 tablet 1   nicotine (NICODERM CQ - DOSED IN MG/24 HOURS) 14 mg/24hr patch Place 1 patch (14 mg total) onto the skin daily. 28 patch 1   rosuvastatin (CRESTOR) 20 MG tablet Take 1 tablet (20 mg total) by mouth daily. 30 tablet 1   sitaGLIPtin (JANUVIA) 100 MG tablet Take 1 tablet (100 mg total) by mouth daily. 90 tablet 0   traMADol (ULTRAM) 50 MG tablet Take 1 tablet (50 mg total) by mouth every 6 (six) hours as needed for moderate pain. 30 tablet 0   umeclidinium bromide (INCRUSE ELLIPTA) 62.5 MCG/INH AEPB Inhale 1 puff into the lungs daily. 30 each 2   varenicline (CHANTIX) 0.5 MG tablet Take 1 tablet (0.5 mg total) by mouth 2 (two) times daily. (Patient taking differently: Take 0.5 mg by mouth daily.) 60 tablet 0   No current facility-administered medications for this visit.   Allergies:  Percocet [oxycodone-acetaminophen]   Social History: The patient  reports that she has quit smoking. Her smoking use included cigarettes. She smoked an average of 1 pack per day. She has never used smokeless tobacco. She reports that she does not drink alcohol and does not use drugs.   Family History: The patient's  family history includes Breast cancer in her paternal aunt, paternal aunt, and paternal grandmother; Hypertension in her father; Stroke in her father.   ROS:  Please see the history of present illness. Otherwise, complete review of systems is positive for none.  All other systems are reviewed and negative.   Physical Exam: VS:  BP 109/74   Pulse 60   Ht '5\' 1"'  (1.549 m)   Wt 157 lb (  71.2 kg)   SpO2 98%   BMI 29.66 kg/m , BMI Body mass index is 29.66 kg/m.  Wt Readings from Last 3 Encounters:  06/10/21 157 lb (71.2 kg)  06/06/21 161 lb (73 kg)  05/26/21 171 lb 1.2 oz (77.6 kg)    General: Patient appears comfortable at rest. Neck: Supple, no elevated JVP or carotid bruits, no thyromegaly. Lungs: Clear to auscultation, nonlabored breathing at rest. Cardiac: Regular rate and rhythm, no S3 or significant systolic murmur, no pericardial rub. Extremities: No pitting edema, distal pulses 2+. Skin: Warm and dry.  Median sternotomy scar, left internal mammary artery harvesting and right saphenous vein graft sites are healing well. Musculoskeletal: No kyphosis. Neuropsychiatric: Alert and oriented x3, affect grossly appropriate.  ECG: EKG on May 22, 2021 sinus rhythm with frequent premature ventricular complexes rate of 75, nonspecific T wave abnormality, prolonged QT Recent Labwork: 01/24/2021: TSH 1.380 05/17/2021: ALT 21; AST 20 05/22/2021: Magnesium 2.2 05/24/2021: BUN 12; Creatinine, Ser 0.58; Hemoglobin 9.8; Platelets 168; Potassium 4.2; Sodium 134     Component Value Date/Time   CHOL 105 05/22/2021 0308   CHOL 251 (H) 01/24/2021 0937   TRIG 72 05/22/2021 0308   HDL 27 (L) 05/22/2021 0308   HDL 48 01/24/2021 0937   CHOLHDL 3.9 05/22/2021 0308   VLDL 14 05/22/2021 0308   LDLCALC 64 05/22/2021 0308   LDLCALC 179 (H) 01/24/2021 0937    Other Studies Reviewed Today:   Vascular ultrasound Dopplers bruit CABG carotid arteries, arms and legs.  05/17/2021  Right Carotid:  Velocities in the right ICA are consistent with a 40-59%                 stenosis.   Left Carotid: Velocities in the left ICA are consistent with a 1-39%  stenosis.  Vertebrals: Bilateral vertebral arteries demonstrate antegrade flow.   Right ABI: Resting right ankle-brachial index is within normal range. No  evidence of significant right lower extremity arterial disease.  Left ABI: Resting left ankle-brachial index is within normal range. No  evidence of significant left lower extremity arterial disease.  Right Upper Extremity: Doppler waveforms remain within normal limits with  right radial compression. Doppler waveforms remain within normal limits  with right ulnar compression.  Left Upper Extremity: Doppler waveforms remain within normal limits with  left radial compression. Doppler waveforms remain within normal limits  with left ulnar compression.       Cardiac catheterization 04/22/2021 Significant three-vessel CAD that has progressed since 2019, inlcluding long segment of mid LAD disease of up to 75% (RFR = 0.83), 60% proximal LCx stenosis followed by sequential 30% and 90% OM1 lesions, and 70% mid RCA disease. Normal left ventricular systolic function and filling pressure.   Recommendations: Outpatient cardiac surgery consultation for CABG. Aggressive secondary prevention. Obtain TTE prior to cardiac surgery consultation.   Nelva Bush, MD [facility]   Diagnostic Dominance: Right          Nuclear stress test 04/05/2021  Narrative & Impression      Findings are equivocal. The study is low risk.   No ST deviation was noted.   LV perfusion is abnormal. Defect 1: There is a medium defect with mild reduction in uptake present in the apical to basal anterior location(s) that is partially reversible. There is normal wall motion in the defect area. Mild anterior ischemia versus variable soft tissue attenuation artifact.  There is significant breast attenuation  at baseline.   Left ventricular function is normal.  End diastolic cavity size is normal. End systolic cavity size is normal.   Overall equivocal findings with partially reversible, apical to basal anterior defect in the setting of significant breast attenuation artifact at baseline.  Cannot exclude mild anterior ischemia although variable soft tissue attenuation could also be responsible.  LVEF is n       Cardiac catheterization 05/12/2018: Conclusions: Moderate to severe 3-vessel coronary artery disease, including 50% proximal LAD, 70% mid LCx, and 60% mid RCA stenoses.  FFR of LAD and RCA lesions is borderline positive (FFR 0.80-0.81).  Mid/distal LCx is small and supplies a relatively small territory. Mildly elevated left ventricular filling pressure with normal left ventricular systolic function.  Recommendations: Aggressive medical therapy.  I will add isosorbide mononitrate 30 mg daily.  Tobacco cessation and better control of her diabetes are critical. If patient has persistent symptoms and/or worsening of her coronary artery disease, surgical consultation would need to be considered, given three-vessel disease involving the proximal LAD and the patient's history of diabetes.  However, I am not sure that the distal LCx is graftable, given its relatively small size.   Recommend Aspirin 55m daily for moderate CAD.   Diagnostic Dominance: Right      Assessment and Plan:  1. S/P CABG x 4   2. Essential hypertension   3. Mixed hyperlipidemia   4. Tobacco abuse   5. 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)   6. Atrial fibrillation, unspecified type ([place]      1.  S/P CABG x 4 / CAD in native artery. She had cardiac catheterization on 04/22/2021 with severe 3 vessel disease. Recommendation for bypass. She is status post CABG x4 with (LIMA-LAD, left radial-diagonal, reverse SVG to PDA, reverse SVG to OM) on 05/21/2021 Dr. LKipp David  Endoscopic GSV  harvesting right side, open left radial artery harvest.  Doing well without any anginal or exertional symptoms.  Continue metoprolol 12.5 mg p.o. twice daily.  Continue aspirin 81 mg daily.  2. Essential hypertension Blood pressure elevated today but patient states she has not taken any of her antihypertensive medication yet.  Continue amlodipine 2.5 mg daily.  Continue Lasix 40 mg p.o. daily.  Continue metoprolol 12.5 mg p.o. twice daily.  3. Mixed hyperlipidemia Lipid panel on 05/22/2021 : TC `05, TG 72, HDL 27, LDL 64.   Continue Crestor 20 mg daily.  Continue aspirin 81 mg daily.  4.  Smoking Long history of smoking.  She was just started on Chantix by primary care provider.  She is status post bypass and states she has not smoked since bypass surgery.  5.  Uncontrolled type 2 diabetes. Recently established with Dr. PPosey Prontoat reasonable primary care.  Recent hemoglobin A1c was 10.3    Continue Januvia 100 mg daily.  Continue Jardiance 10 mg daily.  Continue Glucotrol 5 mg daily.  6. Atrial fibrillation Continue amiodarone 200 mg p.o. daily. Will have patient coem back on 4-6 weeks for follow up EKG and discontinuation of Amiodarone if no further evidence of atrial fibrillation   Medication Adjustments/Labs and Tests Ordered: Current medicines are reviewed at length with the patient today.  Concerns regarding medicines are outlined above.   Disposition: Follow-up with Dr. MDomenic Politeor APP 4-6 weeks   Signed, ALevell July NP 06/10/2021 10:37 AM    [place]  [id number]: [phone] Fax: [phone]

## 2021-06-10 ENCOUNTER — Telehealth: Payer: Self-pay | Admitting: *Deleted

## 2021-06-10 ENCOUNTER — Ambulatory Visit (INDEPENDENT_AMBULATORY_CARE_PROVIDER_SITE_OTHER): Payer: Medicare Other | Admitting: Family Medicine

## 2021-06-10 ENCOUNTER — Encounter: Payer: Self-pay | Admitting: Family Medicine

## 2021-06-10 VITALS — BP 109/74 | HR 60 | Ht 61.0 in | Wt 157.0 lb

## 2021-06-10 DIAGNOSIS — I10 Essential (primary) hypertension: Secondary | ICD-10-CM | POA: Diagnosis not present

## 2021-06-10 DIAGNOSIS — Z951 Presence of aortocoronary bypass graft: Secondary | ICD-10-CM

## 2021-06-10 DIAGNOSIS — E782 Mixed hyperlipidemia: Secondary | ICD-10-CM | POA: Diagnosis not present

## 2021-06-10 DIAGNOSIS — Z720 Tobacco use: Secondary | ICD-10-CM

## 2021-06-10 DIAGNOSIS — E1151 Type 2 diabetes mellitus with diabetic peripheral angiopathy without gangrene: Secondary | ICD-10-CM

## 2021-06-10 DIAGNOSIS — I4891 Unspecified atrial fibrillation: Secondary | ICD-10-CM

## 2021-06-10 NOTE — Telephone Encounter (Signed)
-----   Message from Verta Ellen., NP sent at 06/10/2021  4:50 PM EST ----- Regarding: Stopping Amiodarone Edwena Blow she episode of atrial fibrillation post op. Per Dr McDowell's note below she will need a follow up EKG in 4 to 6 weeks and we will need to stop the Amiodarone. Can you make that happen. Thanks ----- Message ----- From: Satira Sark, MD Sent: 06/10/2021   4:44 PM EST To: Verta Ellen., NP  Jonni Sanger, I reviewed your note.  It looks like she had an episode of postoperative atrial fibrillation and is on amiodarone at this time.  Typically, we would continue a course of postoperative amiodarone for about 6 to 8 weeks and around that time plan to follow-up ECG.  Generally the thoracic surgeons rely on [place] to make decisions about stopping amiodarone and how long the patient is on it.  Please make sure that she has a follow-up in the next 4 to 6 weeks to see how she is doing. ----- Message ----- From: Verta Ellen., NP Sent: 06/10/2021  10:48 AM EST To: Satira Sark, MD

## 2021-06-10 NOTE — Telephone Encounter (Signed)
Spoke with patient about medication change ( stopping Amiodarone)  due to Afib episode and will be contacted back by scheduling  for follow up 4-6 weeks EKG appointment.

## 2021-06-10 NOTE — Patient Instructions (Signed)
Medication Instructions:   Your physician recommends that you continue on your current medications as directed. Please refer to the Current Medication list given to you today.  *If you need a refill on your cardiac medications before your next appointment, please call your pharmacy*   Lab Work: [place]   If you have labs (blood work) drawn today and your tests are completely normal, you will receive your results only by: [place] (if you have MyChart) OR A paper copy in the mail If you have any lab test that is abnormal or we need to change your treatment, we will call you to review the results.   Testing/Procedures: NONE ORDERED  TODAY     Follow-Up: At [facility], you and your health needs are our priority.  As part of our continuing mission to provide you with exceptional heart care, we have created designated Provider Care Teams.  These Care Teams include your primary Cardiologist (physician) and Advanced Practice Providers (APPs -  Physician Assistants and Nurse Practitioners) who all work together to provide you with the care you need, when you need it.  We recommend signing up for the patient portal called "MyChart".  Sign up information is provided on this After Visit Summary.  MyChart is used to connect with patients for Virtual Visits (Telemedicine).  Patients are able to view lab/test results, encounter notes, upcoming appointments, etc.  Non-urgent messages can be sent to your provider as well.   To learn more about what you can do with MyChart, go to [email or web address].    Your next appointment:   3 month(s)  The format for your next appointment:   In Person  Provider:   You may see Rozann Lesches, MD or the following Advanced Practice Provider on your designated Care Team:   Katina Dung, NP    Other Instructions

## 2021-06-24 ENCOUNTER — Other Ambulatory Visit: Payer: Self-pay | Admitting: Thoracic Surgery (Cardiothoracic Vascular Surgery)

## 2021-06-24 DIAGNOSIS — Z951 Presence of aortocoronary bypass graft: Secondary | ICD-10-CM

## 2021-06-25 ENCOUNTER — Ambulatory Visit
Admission: RE | Admit: 2021-06-25 | Discharge: 2021-06-25 | Disposition: A | Discharge status: Home / Self Care | Payer: Medicare Other | Source: Ambulatory Visit | Attending: Thoracic Surgery (Cardiothoracic Vascular Surgery) | Admitting: Thoracic Surgery (Cardiothoracic Vascular Surgery)

## 2021-06-25 ENCOUNTER — Encounter: Payer: Self-pay | Admitting: Physician Assistant

## 2021-06-25 ENCOUNTER — Ambulatory Visit (INDEPENDENT_AMBULATORY_CARE_PROVIDER_SITE_OTHER): Payer: Self-pay | Admitting: Physician Assistant

## 2021-06-25 ENCOUNTER — Other Ambulatory Visit: Payer: Self-pay

## 2021-06-25 VITALS — BP 106/74 | HR 73 | Resp 20 | Ht 61.0 in | Wt 161.0 lb

## 2021-06-25 DIAGNOSIS — I2510 Atherosclerotic heart disease of native coronary artery without angina pectoris: Secondary | ICD-10-CM

## 2021-06-25 DIAGNOSIS — Z951 Presence of aortocoronary bypass graft: Secondary | ICD-10-CM

## 2021-06-25 DIAGNOSIS — I10 Essential (primary) hypertension: Secondary | ICD-10-CM

## 2021-06-25 MED ORDER — AMIODARONE HCL 200 MG PO TABS: 200.0000 mg | ORAL_TABLET | Freq: Every day | ORAL | 0 refills | Status: DC

## 2021-06-25 MED ORDER — AMLODIPINE BESYLATE 2.5 MG PO TABS: 2.5000 mg | ORAL_TABLET | Freq: Every day | ORAL | 5 refills | Status: DC

## 2021-06-25 NOTE — Progress Notes (Signed)
HPI:  Lauren David is a 60 year old female with a history of type 2 diabetes mellitus, dyslipidemia, hypertension, chronic obstructive pulmonary disease, depression, anxiety, and chronic back pain.  She recently underwent evaluation for exertional angina.  Left heart catheterization demonstrated three-vessel coronary artery disease with preserved ventricular function. Patient returns for routine postoperative follow-up having undergone elective CABG x4 on 09/23/2019 by Dr. Kipp Brood. The patient's early postoperative recovery while in the hospital was notable for frequent PVCs and atrial fibrillation.  She was discharged on amiodarone. Since hospital discharge the patient has had a virtual visit with Dr. Kipp Brood on 1028 and an office visit with Mr. Levell July NP of the cardiology service on 06/10/2021. Today, Lauren David reports that she is continuing to progress.  She denies pain, palpitations, or shortness of breath.  She has noticed a tremor in her right thumb and index finger.  She relates this to having a hematoma in her right wrist following the left heart catheterization by one of the radial artery.  She continues to use Chantix and 14 mg NicoDerm patch and has been successful in avoiding cigarettes since her discharge from the hospital.  Current Outpatient Medications  Medication Sig Dispense Refill   acetaminophen (TYLENOL) 500 MG tablet Take 1,000 mg by mouth every 6 (six) hours as needed for moderate pain or headache.     albuterol (PROVENTIL) (2.5 MG/3ML) 0.083% nebulizer solution ONE VIAL BY NEBULIZATION EVERY 6 HOURS AS NEEDED FOR WHEEZING OR SHORTNESS OF BREATH. 180 mL 0   albuterol (VENTOLIN HFA) 108 (90 Base) MCG/ACT inhaler INHALE 2 PUFFS INTO THE LUNGS EVERY 4 HOURS AS NEEDED FOR WHEEZING OR SHORTNESS OF BREATH. 17 g 0   amLODipine (NORVASC) 2.5 MG tablet Take 1 tablet (2.5 mg total) by mouth daily. 35 tablet 0   aspirin 81 MG chewable tablet Chew 4 tablets (324 mg total) by mouth  daily.     blood glucose meter kit and supplies Dispense based on patient and insurance preference. Use up to four times daily as directed. (FOR ICD-10 E10.9, E11.9). 1 each 0   cephALEXin (KEFLEX) 500 MG capsule Take 1 capsule (500 mg total) by mouth 3 (three) times daily. 15 capsule 0   dicyclomine (BENTYL) 10 MG capsule Take 1 capsule (10 mg total) by mouth 3 (three) times daily as needed. 60 capsule 1   empagliflozin (JARDIANCE) 10 MG TABS tablet Take 1 tablet (10 mg total) by mouth daily before breakfast. 30 tablet 2   furosemide (LASIX) 40 MG tablet Take 1 tablet (40 mg total) by mouth 2 (two) times daily. For one week then stop. 7 tablet 0   glipiZIDE (GLUCOTROL) 5 MG tablet Take 1 tablet (5 mg total) by mouth 2 (two) times daily before a meal. 60 tablet 3   metoprolol tartrate (LOPRESSOR) 25 MG tablet Take 0.5 tablets (12.5 mg total) by mouth 2 (two) times daily. 60 tablet 1   nicotine (NICODERM CQ - DOSED IN MG/24 HOURS) 14 mg/24hr patch Place 1 patch (14 mg total) onto the skin daily. 28 patch 1   rosuvastatin (CRESTOR) 20 MG tablet Take 1 tablet (20 mg total) by mouth daily. 30 tablet 1   sitaGLIPtin (JANUVIA) 100 MG tablet Take 1 tablet (100 mg total) by mouth daily. 90 tablet 0   traMADol (ULTRAM) 50 MG tablet Take 1 tablet (50 mg total) by mouth every 6 (six) hours as needed for moderate pain. 30 tablet 0   umeclidinium bromide (INCRUSE ELLIPTA) 62.5 MCG/INH AEPB Inhale  1 puff into the lungs daily. 30 each 2   varenicline (CHANTIX) 0.5 MG tablet Take 1 tablet (0.5 mg total) by mouth 2 (two) times daily. (Patient taking differently: Take 0.5 mg by mouth daily.) 60 tablet 0   No current facility-administered medications for this visit.    Physical Exam  Vital signs Blood pressure 106/74 Pulse 73 Respirations 20 O2 sat 97% on room air  Heart: Regular rate and rhythm with occasional premature beat.  No murmur. Chest: Breath sounds are clear to auscultation.  Sternotomy incision  is healing with no sign of complication.  Sternum is stable. Extremities: No peripheral edema.  There is dry eschar along several segments of the left radial artery harvest incision.  There is no drainage, no sign of infection.  There is a strong ulnar pulse and he appears well perfused.  Diagnostic Tests: EXAM: CHEST - 2 VIEW   COMPARISON:  05/25/2021   FINDINGS: Changes of CABG. Heart and mediastinal contours are within normal limits. No focal opacities or effusions. No acute bony abnormality.   IMPRESSION: No active cardiopulmonary disease.     Electronically Signed   By: Rolm Baptise M.D.   On: 06/25/2021 10:08  Impression / Plan:  Lauren David continues to progress following CABG x4 for angina with preserved LV function.  She has had no further evidence of atrial fibrillation.  Agree with continuing amiodarone for another month and discontinue if she remains in sinus rhythm.  Incisions are healing satisfactorily.  Sternum precautions are reviewed.  She may resume driving.  Referral was made for cardiac rehab.  I suggested she may decrease the dose NicoDerm patch to 7 mg.  She is also on Chantix and it is being managed by her PCP.  Follow-up as needed.   Antony Odea, PA-C [facility] [phone]

## 2021-06-25 NOTE — Progress Notes (Signed)
Referral placed to Outpatient Cardiac Rehab.

## 2021-06-25 NOTE — Patient Instructions (Signed)
Continue to observe sternal precautions with no lifting greater than 15 pounds for another 6 weeks.  Consider decreasing the dose of the NicoDerm patch to 7 mg  May resume driving.  Referral was made to the ED in cardiac rehab program today.

## 2021-07-02 ENCOUNTER — Ambulatory Visit: Payer: Medicare Other | Admitting: Internal Medicine

## 2021-07-09 ENCOUNTER — Other Ambulatory Visit: Payer: Self-pay | Admitting: Internal Medicine

## 2021-07-09 ENCOUNTER — Encounter (HOSPITAL_COMMUNITY): Payer: Medicare Other

## 2021-07-09 DIAGNOSIS — Z720 Tobacco use: Secondary | ICD-10-CM

## 2021-07-10 ENCOUNTER — Encounter: Payer: Self-pay | Admitting: *Deleted

## 2021-07-10 ENCOUNTER — Telehealth: Payer: Self-pay | Admitting: *Deleted

## 2021-07-10 ENCOUNTER — Ambulatory Visit (INDEPENDENT_AMBULATORY_CARE_PROVIDER_SITE_OTHER): Payer: Medicare Other | Admitting: *Deleted

## 2021-07-10 VITALS — Ht 61.0 in | Wt 160.0 lb

## 2021-07-10 DIAGNOSIS — I4891 Unspecified atrial fibrillation: Secondary | ICD-10-CM

## 2021-07-10 NOTE — Progress Notes (Signed)
Presents for EKG per 06/10/21 phone note suggesting patient stop amiodarone and have ekg in 1 month. Medications reviewed and updated

## 2021-07-10 NOTE — Telephone Encounter (Signed)
Satira Sark, MD  Merlene Laughter, RN Yes, she should continue aspirin 325 mg dose at this time.        Previous Messages   ----- Message -----  From: Merlene Laughter, RN  Sent: 07/10/2021  11:21 AM EST  To: Satira Sark, MD   She is asking if she needed to continue taking (4) 81 mg aspirin each day

## 2021-07-10 NOTE — Telephone Encounter (Signed)
Patient informed and verbalized understanding of plan. 

## 2021-07-12 ENCOUNTER — Ambulatory Visit: Payer: Medicare Other

## 2021-07-16 ENCOUNTER — Other Ambulatory Visit: Payer: Self-pay | Admitting: Internal Medicine

## 2021-07-16 DIAGNOSIS — E119 Type 2 diabetes mellitus without complications: Secondary | ICD-10-CM

## 2021-07-18 ENCOUNTER — Ambulatory Visit (HOSPITAL_COMMUNITY)
Admission: RE | Admit: 2021-07-18 | Discharge: 2021-07-18 | Disposition: A | Discharge status: Home / Self Care | Payer: Medicare Other | Source: Ambulatory Visit | Attending: Internal Medicine | Admitting: Internal Medicine

## 2021-07-18 ENCOUNTER — Other Ambulatory Visit: Payer: Self-pay

## 2021-07-18 DIAGNOSIS — M8580 Other specified disorders of bone density and structure, unspecified site: Secondary | ICD-10-CM | POA: Insufficient documentation

## 2021-07-18 DIAGNOSIS — Z780 Asymptomatic menopausal state: Secondary | ICD-10-CM | POA: Diagnosis not present

## 2021-07-22 ENCOUNTER — Telehealth: Payer: Self-pay | Admitting: Cardiology

## 2021-07-22 MED ORDER — ROSUVASTATIN CALCIUM 20 MG PO TABS: 20.0000 mg | ORAL_TABLET | Freq: Every day | ORAL | 6 refills | Status: DC

## 2021-07-22 NOTE — Telephone Encounter (Signed)
° ° °*  STAT* If patient is at the pharmacy, call can be transferred to refill team.   1. Which medications need to be refilled? (please list name of each medication and dose if known) rosuvastatin (CRESTOR) 20 MG tablet   2. Which pharmacy/location (including street and city if local pharmacy) is medication to be sent to? [place], [place]  3. Do they need a 30 day or 90 day supply? 30 days

## 2021-08-09 ENCOUNTER — Encounter (HOSPITAL_COMMUNITY): Payer: Self-pay

## 2021-08-09 ENCOUNTER — Other Ambulatory Visit: Payer: Self-pay

## 2021-08-09 ENCOUNTER — Encounter (HOSPITAL_COMMUNITY)
Admission: RE | Admit: 2021-08-09 | Discharge: 2021-08-09 | Disposition: A | Discharge status: Home / Self Care | Payer: Medicare Other | Source: Ambulatory Visit | Attending: Cardiology | Admitting: Cardiology

## 2021-08-09 VITALS — BP 102/56 | HR 70 | Ht 61.0 in | Wt 167.1 lb

## 2021-08-09 DIAGNOSIS — Z951 Presence of aortocoronary bypass graft: Secondary | ICD-10-CM | POA: Diagnosis present

## 2021-08-09 DIAGNOSIS — Z79899 Other long term (current) drug therapy: Secondary | ICD-10-CM | POA: Insufficient documentation

## 2021-08-09 LAB — GLUCOSE, CAPILLARY: Glucose-Capillary: 202 mg/dL — ABNORMAL HIGH (ref 70–99)

## 2021-08-09 NOTE — Progress Notes (Signed)
Cardiac Individual Treatment Plan  Patient Details  Name: Lauren David MRN: [id number] Date of Birth: [birth year] Referring Provider:   Flowsheet Row CARDIAC REHAB PHASE II ORIENTATION from 08/09/2021 in [facility]  Referring Provider Dr. Kipp Brood       Initial Encounter Date:  Flowsheet Row CARDIAC REHAB PHASE II ORIENTATION from 08/09/2021 in [facility]  Date 08/09/21       Visit Diagnosis: S/P CABG x 4  Patient's Home Medications on Admission:  Current Outpatient Medications:    acetaminophen (TYLENOL) 500 MG tablet, Take 1,000 mg by mouth every 6 (six) hours as needed for moderate pain or headache., Disp: , Rfl:    albuterol (PROVENTIL) (2.5 MG/3ML) 0.083% nebulizer solution, ONE VIAL BY NEBULIZATION EVERY 6 HOURS AS NEEDED FOR WHEEZING OR SHORTNESS OF BREATH. (Patient taking differently: Take 2.5 mg by nebulization every 6 (six) hours as needed for wheezing.), Disp: 180 mL, Rfl: 0   albuterol (VENTOLIN HFA) 108 (90 Base) MCG/ACT inhaler, INHALE 2 PUFFS INTO THE LUNGS EVERY 4 HOURS AS NEEDED FOR WHEEZING OR SHORTNESS OF BREATH. (Patient taking differently: Inhale 1-2 puffs into the lungs every 4 (four) hours as needed for shortness of breath.), Disp: 17 g, Rfl: 0   amLODipine (NORVASC) 2.5 MG tablet, Take 1 tablet (2.5 mg total) by mouth daily., Disp: 30 tablet, Rfl: 5   aspirin 81 MG chewable tablet, Chew 4 tablets (324 mg total) by mouth daily., Disp: , Rfl:    dicyclomine (BENTYL) 10 MG capsule, Take 1 capsule (10 mg total) by mouth 3 (three) times daily as needed. (Patient taking differently: Take 10 mg by mouth 3 (three) times daily as needed for spasms.), Disp: 60 capsule, Rfl: 1   empagliflozin (JARDIANCE) 10 MG TABS tablet, Take 1 tablet (10 mg total) by mouth daily before breakfast., Disp: 30 tablet, Rfl: 2   glipiZIDE (GLUCOTROL) 5 MG tablet, Take 1 tablet (5 mg total) by mouth 2 (two) times daily before a meal., Disp: 60 tablet,  Rfl: 3   JANUVIA 100 MG tablet, TAKE 1 TABLET ONCE DAILY. (Patient taking differently: Take 100 mg by mouth daily.), Disp: 30 tablet, Rfl: 0   metoprolol tartrate (LOPRESSOR) 25 MG tablet, Take 0.5 tablets (12.5 mg total) by mouth 2 (two) times daily., Disp: 60 tablet, Rfl: 1   nicotine (NICODERM CQ - DOSED IN MG/24 HOURS) 14 mg/24hr patch, Place 1 patch (14 mg total) onto the skin daily., Disp: 28 patch, Rfl: 1   rosuvastatin (CRESTOR) 20 MG tablet, Take 1 tablet (20 mg total) by mouth daily., Disp: 30 tablet, Rfl: 6   umeclidinium bromide (INCRUSE ELLIPTA) 62.5 MCG/INH AEPB, Inhale 1 puff into the lungs daily., Disp: 30 each, Rfl: 2   varenicline (CHANTIX) 0.5 MG tablet, Take 1 tablet (0.5 mg total) by mouth 2 (two) times daily. (Patient taking differently: Take 1 mg by mouth 2 (two) times daily.), Disp: 60 tablet, Rfl: 0   blood glucose meter kit and supplies, Dispense based on patient and insurance preference. Use up to four times daily as directed. (FOR ICD-10 E10.9, E11.9)., Disp: 1 each, Rfl: 0   furosemide (LASIX) 40 MG tablet, Take 1 tablet (40 mg total) by mouth 2 (two) times daily. For one week then stop. (Patient not taking: Reported on 08/09/2021), Disp: 7 tablet, Rfl: 0   traMADol (ULTRAM) 50 MG tablet, Take 1 tablet (50 mg total) by mouth every 6 (six) hours as needed for moderate pain. 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 Smokig x 23yr. Did smoke 2 packs x 5 yrs, but cut back a month ago.     Labs: Recent Review Flowsheet Data     Labs for ITP Cardiac and Pulmonary Rehab Latest Ref Rng & Units 05/21/2021 05/21/2021 05/21/2021 05/21/2021 05/22/2021   Cholestrol 0 - 200 mg/dL - - - - 105   LDLCALC 0 - 99 mg/dL - - - - 64   HDL >40 mg/dL - - - - 27(L)   Trlycerides <150 mg/dL - - - - 72   Hemoglobin A1c 4.8 - 5.6 % - - - - -   PHART 7.350 - 7.450 7.311(L) 7.170(LL) 7.293(L) 7.306(L) -   PCO2ART 32.0 - 48.0 mmHg 50.5(H) 72.4(HH) 46.2 48.4(H) -   HCO3 20.0 - 28.0 mmol/L 25.5 26.4 22.3 24.1 -   TCO2 22 - 32 mmol/L '27 29 24 26 ' -   ACIDBASEDEF 0.0 - 2.0 mmol/L 1.0 3.0(H) 4.0(H) 2.0 -   O2SAT % 100.0 94.0 92.0 92.0 -       Capillary Blood Glucose: Lab Results  Component Value Date   GLUCAP 202 (H) 08/09/2021   GLUCAP 196 (H) 05/26/2021   GLUCAP 124 (H) 05/26/2021   GLUCAP 150 (H) 05/25/2021   GLUCAP 141 (H) 05/25/2021     Exercise Target Goals: Exercise Program Goal: Individual exercise prescription set using results from initial 6 min walk test and THRR while considering  patients activity barriers and safety.   Exercise Prescription Goal: Starting with aerobic activity 30 plus minutes a day, 3 days per week for initial exercise prescription. Provide home exercise prescription and guidelines that participant acknowledges understanding prior to discharge.  Activity Barriers & Risk Stratification:  Activity Barriers & Cardiac Risk Stratification - 08/09/21 1324       Activity Barriers & Cardiac Risk Stratification   Activity Barriers Arthritis;Back Problems;Joint Problems;Deconditioning;Shortness of Breath;Balance Concerns    Cardiac Risk Stratification High             6 Minute Walk:  6 Minute Walk     Row Name 08/09/21 1457         6 Minute Walk   Phase Initial     Distance 1150 feet     Walk  Time 6 minutes     # of Rest Breaks 0     MPH 2.18     METS 2.41     RPE 13     VO2 Peak 8.43     Symptoms Yes (comment)     Comments R hip pain 3/10     Resting HR 70 bpm     Resting BP 102/56     Resting Oxygen Saturation  98 %  Exercise Oxygen Saturation  during 6 min walk 98 %     Max Ex. HR 71 bpm     Max Ex. BP 118/66     2 Minute Post BP 104/58              Oxygen Initial Assessment:   Oxygen Re-Evaluation:   Oxygen Discharge (Final Oxygen Re-Evaluation):   Initial Exercise Prescription:  Initial Exercise Prescription - 08/09/21 1400       Date of Initial Exercise RX and Referring Provider   Date 08/09/21    Referring Provider Dr. Kipp Brood    Expected Discharge Date 11/01/21      Treadmill   MPH 1    Grade 0    Minutes 17      NuStep   Level 1    SPM 80    Minutes 22      Prescription Details   Frequency (times per week) 3    Duration Progress to 30 minutes of continuous aerobic without signs/symptoms of physical distress      Intensity   THRR 40-80% of Max Heartrate 63-126    Ratings of Perceived Exertion 11-13    Perceived Dyspnea 0-4      Resistance Training   Training Prescription Yes    Weight 2 lbs    Reps 10-15             Perform Capillary Blood Glucose checks as needed.  Exercise Prescription Changes:   Exercise Comments:   Exercise Goals and Review:   Exercise Goals     Row Name 08/09/21 1500             Exercise Goals   Increase Physical Activity Yes       Intervention Provide advice, education, support and counseling about physical activity/exercise needs.;Develop an individualized exercise prescription for aerobic and resistive training based on initial evaluation findings, risk stratification, comorbidities and participant's personal goals.       Expected Outcomes Short Term: Attend rehab on a regular basis to increase amount of physical activity.;Long Term: Add in home exercise to make exercise part  of routine and to increase amount of physical activity.;Long Term: Exercising regularly at least 3-5 days a week.       Increase Strength and Stamina Yes       Intervention Provide advice, education, support and counseling about physical activity/exercise needs.;Develop an individualized exercise prescription for aerobic and resistive training based on initial evaluation findings, risk stratification, comorbidities and participant's personal goals.       Expected Outcomes Short Term: Increase workloads from initial exercise prescription for resistance, speed, and METs.;Short Term: Perform resistance training exercises routinely during rehab and add in resistance training at home;Long Term: Improve cardiorespiratory fitness, muscular endurance and strength as measured by increased METs and functional capacity (6MWT)       Able to understand and use rate of perceived exertion (RPE) scale Yes       Intervention Provide education and explanation on how to use RPE scale       Expected Outcomes Short Term: Able to use RPE daily in rehab to express subjective intensity level;Long Term:  Able to use RPE to guide intensity level when exercising independently       Knowledge and understanding of Target Heart Rate Range (THRR) Yes       Intervention Provide education and explanation of THRR including how the numbers were predicted and where they are located for reference  Expected Outcomes Short Term: Able to state/look up THRR;Long Term: Able to use THRR to govern intensity when exercising independently;Short Term: Able to use daily as guideline for intensity in rehab       Able to check pulse independently Yes       Intervention Provide education and demonstration on how to check pulse in carotid and radial arteries.;Review the importance of being able to check your own pulse for safety during independent exercise       Expected Outcomes Short Term: Able to explain why pulse checking is important during  independent exercise;Long Term: Able to check pulse independently and accurately       Understanding of Exercise Prescription Yes       Intervention Provide education, explanation, and written materials on patient's individual exercise prescription       Expected Outcomes Short Term: Able to explain program exercise prescription;Long Term: Able to explain home exercise prescription to exercise independently                Exercise Goals Re-Evaluation :    Discharge Exercise Prescription (Final Exercise Prescription Changes):   Nutrition:  Target Goals: Understanding of nutrition guidelines, daily intake of sodium <1524m, cholesterol <2047m calories 30% from fat and 7% or less from saturated fats, daily to have 5 or more servings of fruits and vegetables.  Biometrics:  Pre Biometrics - 08/09/21 1500       Pre Biometrics   Height '5\' 1"'  (1.549 m)    Weight 167 lb 1.7 oz (75.8 kg)    Waist Circumference 41.5 inches    Hip Circumference 44.5 inches    Waist to Hip Ratio 0.93 %    BMI (Calculated) 31.59    Triceps Skinfold 29 mm    % Body Fat 43.7 %    Grip Strength 25.1 kg    Flexibility 10.5 in    Single Leg Stand 10 seconds              Nutrition Therapy Plan and Nutrition Goals:  Nutrition Therapy & Goals - 08/09/21 1332       Intervention Plan   Intervention Prescribe, educate and counsel regarding individualized specific dietary modifications aiming towards targeted core components such as weight, hypertension, lipid management, diabetes, heart failure and other comorbidities.;Nutrition handout(s) given to patient.    Expected Outcomes Short Term Goal: Understand basic principles of dietary content, such as calories, fat, sodium, cholesterol and nutrients.;Long Term Goal: Adherence to prescribed nutrition plan.             Nutrition Assessments:  Nutrition Assessments - 08/09/21 1331       MEDFICTS Scores   Pre Score 24            MEDIFICTS  Score Key: ?70 Need to make dietary changes  40-70 Heart Healthy Diet ? 40 Therapeutic Level Cholesterol Diet   Picture Your Plate Scores: <4<17nhealthy dietary pattern with much room for improvement. 41-50 Dietary pattern unlikely to meet recommendations for good health and room for improvement. 51-60 More healthful dietary pattern, with some room for improvement.  >60 Healthy dietary pattern, although there may be some specific behaviors that could be improved.    Nutrition Goals Re-Evaluation:   Nutrition Goals Discharge (Final Nutrition Goals Re-Evaluation):   Psychosocial: Target Goals: Acknowledge presence or absence of significant depression and/or stress, maximize coping skills, provide positive support system. Participant is able to verbalize types and ability to use techniques and skills needed for reducing stress  and depression.  Initial Review & Psychosocial Screening:  Initial Psych Review & Screening - 08/09/21 1328       Initial Review   Current issues with Current Anxiety/Panic;Current Sleep Concerns      Family Dynamics   Good Support System? Yes    Comments Her support system include three of her grandchildren who live with her. One of her daughters also supports her.      Barriers   Psychosocial barriers to participate in program There are no identifiable barriers or psychosocial needs.      Screening Interventions   Interventions Encouraged to exercise;Provide feedback about the scores to participant    Expected Outcomes Long Term goal: The participant improves quality of Life and PHQ9 Scores as seen by post scores and/or verbalization of changes;Short Term goal: Identification and review with participant of any Quality of Life or Depression concerns found by scoring the questionnaire.             Quality of Life Scores:  Quality of Life - 08/09/21 1503       Quality of Life   Select Quality of Life      Quality of Life Scores    Health/Function Pre 24.43 %    Socioeconomic Pre 29.14 %    Psych/Spiritual Pre 27.36 %    Family Pre 27 %    GLOBAL Pre 26.42 %            Scores of 19 and below usually indicate a poorer quality of life in these areas.  A difference of  2-3 points is a clinically meaningful difference.  A difference of 2-3 points in the total score of the Quality of Life Index has been associated with significant improvement in overall quality of life, self-image, physical symptoms, and general health in studies assessing change in quality of life.  PHQ-9: Recent Review Flowsheet Data     Depression screen [facility] 2/9 08/09/2021 06/06/2021 03/11/2021 03/11/2021 09/10/2020   Decreased Interest 0 0 0 0 0   Down, Depressed, Hopeless 0 0 0 0 0   PHQ - 2 Score 0 0 0 0 0   Altered sleeping 2  - - - -   Tired, decreased energy 1 - - - -   Change in appetite 1 - - - -   Feeling bad or failure about yourself  0 - - - -   Trouble concentrating 0 - - - -   Moving slowly or fidgety/restless 0 - - - -   Suicidal thoughts 0 - - - -   PHQ-9 Score 4 - - - -   Difficult doing work/chores Not difficult at all - - - -      Interpretation of Total Score  Total Score Depression Severity:  1-4 = Minimal depression, 5-9 = Mild depression, 10-14 = Moderate depression, 15-19 = Moderately severe depression, 20-27 = Severe depression   Psychosocial Evaluation and Intervention:  Psychosocial Evaluation - 08/09/21 1503       Psychosocial Evaluation & Interventions   Interventions Stress management education;Relaxation education;Encouraged to exercise with the program and follow exercise prescription    Comments Pt has no barriers to completing cardiac rehab. She does have anxiety but no other psychosocial concerns. She stated that she has been on medication and seen a therapist in the past, but that she no longer requires it anymore. She states that she has her anxiety under control. She does report that she has trouble with her  sleep,  but does not have any specific reason as to why, nor does she take anything for sleep. She reports that she has a good support system with her daughter and her three adult grand children who live with her. Her goals in the program are to lose weight, gain strength, and to improve her mobility. Another goal is to continue to to improve her diabetic management. She has made improvements with her diet and is now taking all of her prescribed medications for this. She is eager to start the program.    Expected Outcomes Pt's anxiety will continue to be controlled and she will have no other identfiable psychosocial issues.    Continue Psychosocial Services  No Follow up required             Psychosocial Re-Evaluation:   Psychosocial Discharge (Final Psychosocial Re-Evaluation):   Vocational Rehabilitation: Provide vocational rehab assistance to qualifying candidates.   Vocational Rehab Evaluation & Intervention:  Vocational Rehab - 08/09/21 1335       Initial Vocational Rehab Evaluation & Intervention   Assessment shows need for Vocational Rehabilitation No   on disability            Education: Education Goals: Education classes will be provided on a weekly basis, covering required topics. Participant will state understanding/return demonstration of topics presented.  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 Discus how high blood pressure affects the body and ways to reduce high blood pressure.   Exercise and Your Heart -Discuss why it is important to exercise, the FITT principles of exercise, normal and abnormal responses to exercise, and how to exercise  safely.   Angina -Discuss definition of angina, causes of angina, treatment of angina, and how to decrease risk of having angina.   Cardiac Medications -Review what the following cardiac medications are used for, how they affect the body, and side effects that may occur when taking the medications.  Medications include Aspirin, Beta blockers, calcium channel blockers, ACE Inhibitors, angiotensin receptor blockers, diuretics, digoxin, and antihyperlipidemics.   Congestive Heart Failure -Discuss the definition of CHF, how to live with CHF, the signs and symptoms of CHF, and how keep track of weight and sodium intake.   Heart Disease and Intimacy -Discus the effect sexual activity has on the heart, how changes occur during intimacy as we age, and safety during sexual activity.   Smoking Cessation / COPD -Discuss different methods to quit smoking, the health benefits of quitting smoking, and the definition of COPD.   Nutrition I: Fats -Discuss the types of cholesterol, what cholesterol does to the heart, and how cholesterol levels can be controlled.   Nutrition II: Labels -Discuss the different components of food labels and how to read food label   Heart Parts/Heart Disease and PAD -Discuss the anatomy of the heart, the pathway of blood circulation through the heart, and these are affected by heart disease.   Stress I: Signs and Symptoms -Discuss the causes of stress, how stress may lead to anxiety and depression, and ways to limit stress.   Stress II: Relaxation -Discuss different types of relaxation techniques to limit stress.   Warning Signs of Stroke / TIA -Discuss definition of  a stroke, what the signs and symptoms are of a stroke, and how to identify when someone is having stroke.   Knowledge Questionnaire Score:  Knowledge Questionnaire Score - 08/09/21 1333       Knowledge Questionnaire Score   Pre Score 22/24             Core Components/Risk  Factors/Patient Goals at Admission:  Personal Goals and Risk Factors at Admission - 08/09/21 1335       Core Components/Risk Factors/Patient Goals on Admission    Weight Management Yes;Weight Loss;Obesity    Intervention Weight Management: Develop a combined nutrition and exercise program designed to reach desired caloric intake, while maintaining appropriate intake of nutrient and fiber, sodium and fats, and appropriate energy expenditure required for the weight goal.;Weight Management: Provide education and appropriate resources to help participant work on and attain dietary goals.;Weight Management/Obesity: Establish reasonable short term and long term weight goals.;Obesity: Provide education and appropriate resources to help participant work on and attain dietary goals.    Expected Outcomes Short Term: Continue to assess and modify interventions until short term weight is achieved;Long Term: Adherence to nutrition and physical activity/exercise program aimed toward attainment of established weight goal;Weight Maintenance: Understanding of the daily nutrition guidelines, which includes 25-35% calories from fat, 7% or less cal from saturated fats, less than 236m cholesterol, less than 1.5gm of sodium, & 5 or more servings of fruits and vegetables daily;Weight Loss: Understanding of general recommendations for a balanced deficit meal plan, which promotes 1-2 lb weight loss per week and includes a negative energy balance of [phone] kcal/d;Understanding recommendations for meals to include 15-35% energy as protein, 25-35% energy from fat, 35-60% energy from carbohydrates, less than 2059mof dietary cholesterol, 20-35 gm of total fiber daily;Understanding of distribution of calorie intake throughout the day with the consumption of 4-5 meals/snacks    Improve shortness of breath with ADL's Yes    Intervention Provide education, individualized exercise plan and daily activity instruction to help decrease  symptoms of SOB with activities of daily living.    Expected Outcomes Short Term: Improve cardiorespiratory fitness to achieve a reduction of symptoms when performing ADLs;Long Term: Be able to perform more ADLs without symptoms or delay the onset of symptoms    Diabetes Yes    Intervention Provide education about signs/symptoms and action to take for hypo/hyperglycemia.;Provide education about proper nutrition, including hydration, and aerobic/resistive exercise prescription along with prescribed medications to achieve blood glucose in normal ranges: Fasting glucose 65-99 mg/dL    Expected Outcomes Long Term: Attainment of HbA1C < 7%.;Short Term: Participant verbalizes understanding of the signs/symptoms and immediate care of hyper/hypoglycemia, proper foot care and importance of medication, aerobic/resistive exercise and nutrition plan for blood glucose control.    Personal Goal Other Yes    Personal Goal improve strength and mobility    Intervention Attend cardiac rehab and begin a home exercise program.    Expected Outcomes Pt will be able to improve their strength and stamina             Core Components/Risk Factors/Patient Goals Review:    Core Components/Risk Factors/Patient Goals at Discharge (Final Review):    ITP Comments:   Comments: Patient arrived for 1st visit/orientation/education at 1230. Patient was referred to CR by Dr. LiKipp Broodue to CABG x 4 (Z95.1). During orientation advised patient on arrival and appointment times what to wear, what to do before, during and after exercise. Reviewed attendance and class policy.  Pt is scheduled  to return Cardiac Rehab on 08/12/2021 at 1100. Pt was advised to come to class 15 minutes before class starts.  Discussed RPE/Dpysnea scales. Patient participated in warm up stretches. Patient was able to complete 6 minute walk test.  Telemetry: NSR. Patient was measured for the equipment. Discussed equipment safety with patient. Took patient  pre-anthropometric measurements. Patient finished visit at 1345.

## 2021-08-12 ENCOUNTER — Other Ambulatory Visit: Payer: Self-pay

## 2021-08-12 ENCOUNTER — Encounter (HOSPITAL_COMMUNITY)
Admission: RE | Admit: 2021-08-12 | Discharge: 2021-08-12 | Disposition: A | Discharge status: Home / Self Care | Payer: Medicare Other | Source: Ambulatory Visit | Attending: Cardiology | Admitting: Cardiology

## 2021-08-12 DIAGNOSIS — Z951 Presence of aortocoronary bypass graft: Secondary | ICD-10-CM | POA: Diagnosis not present

## 2021-08-12 NOTE — Progress Notes (Signed)
Daily Session Note  Patient Details  Name: Lauren David MRN: [id number] Date of Birth: 13-Feb-1961 Referring Provider:   Flowsheet Row CARDIAC REHAB PHASE II ORIENTATION from 08/09/2021 in [place]  Referring Provider Dr. Kipp Brood       Encounter Date: 08/12/2021  Check In:  Session Check In - 08/12/21 1100       Check-In   Supervising physician immediately available to respond to emergencies [facility] MD immediately available    Physician(s) Dr. Harrington Challenger    [facility]    Staff Present Hoy Register, MS, ACSM-CEP, Exercise Physiologist;Heather Otho Ket, BS, Exercise Physiologist;Debra Wynetta Emery, RN, BSN    Virtual Visit No    Medication changes reported     No    Fall or balance concerns reported    Yes    Comments She frequently loses her balance but does not fall due to her knees and hips giving out.    Tobacco Cessation No Change    Warm-up and Cool-down Performed as group-led instruction    Resistance Training Performed Yes    VAD Patient? No    PAD/SET Patient? No      Pain Assessment   Currently in Pain? No/denies    Multiple Pain Sites No             Capillary Blood Glucose: No results found for this or any previous visit (from the past 24 hour(s)).    Social History   Tobacco Use  Smoking Status Former   Packs/day: 1.00   Types: Cigarettes  Smokeless Tobacco Never  Tobacco Comments   cut back to 1 pack a day. Smokig x 35yr. Did smoke 2 packs x 5 yrs, but cut back a month ago.     Goals Met:  Independence with exercise equipment Exercise tolerated well No report of concerns or symptoms today Strength training completed today  Goals Unmet:  Not Applicable  Comments: checkout time is 1200   Dr. JKathie Dikeis Medical Director [facility].

## 2021-08-14 ENCOUNTER — Encounter (HOSPITAL_COMMUNITY)
Admission: RE | Admit: 2021-08-14 | Discharge: 2021-08-14 | Disposition: A | Discharge status: Home / Self Care | Payer: Medicare Other | Source: Ambulatory Visit | Attending: Cardiology | Admitting: Cardiology

## 2021-08-14 DIAGNOSIS — Z951 Presence of aortocoronary bypass graft: Secondary | ICD-10-CM

## 2021-08-14 NOTE — Progress Notes (Signed)
Daily Session Note  Patient Details  Name: Lauren David MRN: [id number] Date of Birth: Mar 14, 1961 Referring Provider:   Flowsheet Row CARDIAC REHAB PHASE II ORIENTATION from 08/09/2021 in [place]  Referring Provider Dr. Kipp Brood       Encounter Date: 08/14/2021  Check In:  Session Check In - 08/14/21 1100       Check-In   Supervising physician immediately available to respond to emergencies [facility] immediately available    Physician(s) Gardiner Rhyme    [facility]    Staff Present Hoy Register, MS, ACSM-CEP, Exercise Physiologist;Heather Otho Ket, BS, Exercise Physiologist;Jaylissa Felty Wynetta Emery, RN, BSN    Virtual Visit No    Medication changes reported     No    Fall or balance concerns reported    Yes    Comments She frequently loses her balance but does not fall due to her knees and hips giving out.    Tobacco Cessation No Change    Warm-up and Cool-down Performed as group-led instruction    Resistance Training Performed Yes    VAD Patient? No    PAD/SET Patient? No      Pain Assessment   Currently in Pain? No/denies    Multiple Pain Sites No             Capillary Blood Glucose: No results found for this or any previous visit (from the past 24 hour(s)).    Social History   Tobacco Use  Smoking Status Former   Packs/day: 1.00   Types: Cigarettes  Smokeless Tobacco Never  Tobacco Comments   cut back to 1 pack a day. Smokig x 37yr. Did smoke 2 packs x 5 yrs, but cut back a month ago.     Goals Met:  Independence with exercise equipment Exercise tolerated well No report of concerns or symptoms today Strength training completed today  Goals Unmet:  Not Applicable  Comments: Check out 1200.   Dr. JKathie Dikeis Medical Director [facility].

## 2021-08-16 ENCOUNTER — Encounter (HOSPITAL_COMMUNITY)
Admission: RE | Admit: 2021-08-16 | Discharge: 2021-08-16 | Disposition: A | Discharge status: Home / Self Care | Payer: Medicare Other | Source: Ambulatory Visit | Attending: Cardiology | Admitting: Cardiology

## 2021-08-16 DIAGNOSIS — Z951 Presence of aortocoronary bypass graft: Secondary | ICD-10-CM | POA: Diagnosis not present

## 2021-08-16 NOTE — Progress Notes (Signed)
Daily Session Note  Patient Details  Name: Lauren David MRN: [id number] Date of Birth: May 07, 1961 Referring Provider:   Flowsheet Row CARDIAC REHAB PHASE II ORIENTATION from 08/09/2021 in [place]  Referring Provider Dr. Kipp Brood       Encounter Date: 08/16/2021  Check In:  Session Check In - 08/16/21 1100       Check-In   Supervising physician immediately available to respond to emergencies [facility] immediately available    Physician(s) Dr. Domenic Polite    [facility]    Staff Present Hoy Register, MS, ACSM-CEP, Exercise Physiologist;Heather Otho Ket, BS, Exercise Physiologist;Keygan Dumond Wynetta Emery, RN, BSN    Virtual Visit No    Medication changes reported     No    Fall or balance concerns reported    Yes    Comments She frequently loses her balance but does not fall due to her knees and hips giving out.    Tobacco Cessation No Change    Warm-up and Cool-down Performed as group-led instruction    Resistance Training Performed Yes    VAD Patient? No    PAD/SET Patient? No      Pain Assessment   Currently in Pain? No/denies    Multiple Pain Sites No             Capillary Blood Glucose: No results found for this or any previous visit (from the past 24 hour(s)).    Social History   Tobacco Use  Smoking Status Former   Packs/day: 1.00   Types: Cigarettes  Smokeless Tobacco Never  Tobacco Comments   cut back to 1 pack a day. Smokig x 27yr. Did smoke 2 packs x 5 yrs, but cut back a month ago.     Goals Met:  Independence with exercise equipment Exercise tolerated well No report of concerns or symptoms today Strength training completed today  Goals Unmet:  Not Applicable  Comments: Check out 1200.   Dr. JKathie Dikeis Medical Director [facility].

## 2021-08-19 ENCOUNTER — Encounter (HOSPITAL_COMMUNITY)
Admission: RE | Admit: 2021-08-19 | Discharge: 2021-08-19 | Disposition: A | Discharge status: Home / Self Care | Payer: Medicare Other | Source: Ambulatory Visit | Attending: Cardiology | Admitting: Cardiology

## 2021-08-19 VITALS — Wt 164.9 lb

## 2021-08-19 DIAGNOSIS — Z951 Presence of aortocoronary bypass graft: Secondary | ICD-10-CM | POA: Diagnosis not present

## 2021-08-19 NOTE — Progress Notes (Signed)
Daily Session Note  Patient Details  Name: Lauren David MRN: [id number] Date of Birth: 04-05-1961 Referring Provider:   Flowsheet Row CARDIAC REHAB PHASE II ORIENTATION from 08/09/2021 in [place]  Referring Provider Dr. Kipp Brood       Encounter Date: 08/19/2021  Check In:  Session Check In - 08/19/21 1100       Check-In   Supervising physician immediately available to respond to emergencies [facility] immediately available    Physician(s) [facility]    [facility]    Staff Present Hoy Register, MS, ACSM-CEP, Exercise Physiologist;Heather Otho Ket, BS, Exercise Physiologist;Zelie Asbill Wynetta Emery, RN, BSN    Virtual Visit No    Medication changes reported     No    Fall or balance concerns reported    Yes    Comments She frequently loses her balance but does not fall due to her knees and hips giving out.    Tobacco Cessation No Change    Warm-up and Cool-down Performed as group-led instruction    Resistance Training Performed Yes    PAD/SET Patient? No      Pain Assessment   Currently in Pain? No/denies    Multiple Pain Sites No             Capillary Blood Glucose: No results found for this or any previous visit (from the past 24 hour(s)).    Social History   Tobacco Use  Smoking Status Former   Packs/day: 1.00   Types: Cigarettes  Smokeless Tobacco Never  Tobacco Comments   cut back to 1 pack a day. Smokig x 53yr. Did smoke 2 packs x 5 yrs, but cut back a month ago.     Goals Met:  Independence with exercise equipment Exercise tolerated well No report of concerns or symptoms today Strength training completed today  Goals Unmet:  Not Applicable  Comments: Check out 1200.   Dr. JKathie Dikeis Medical Director [facility].

## 2021-08-21 ENCOUNTER — Encounter (HOSPITAL_COMMUNITY)
Admission: RE | Admit: 2021-08-21 | Discharge: 2021-08-21 | Disposition: A | Discharge status: Home / Self Care | Payer: Medicare Other | Source: Ambulatory Visit | Attending: Cardiology | Admitting: Cardiology

## 2021-08-21 DIAGNOSIS — Z951 Presence of aortocoronary bypass graft: Secondary | ICD-10-CM | POA: Diagnosis not present

## 2021-08-21 NOTE — Progress Notes (Signed)
Daily Session Note  Patient Details  Name: Lauren David MRN: [id number] Date of Birth: 09-02-1960 Referring Provider:   Flowsheet Row CARDIAC REHAB PHASE II ORIENTATION from 08/09/2021 in [place]  Referring Provider Dr. Kipp Brood       Encounter Date: 08/21/2021  Check In:  Session Check In - 08/21/21 1054       Check-In   Supervising physician immediately available to respond to emergencies [facility] immediately available    Physician(s) Dr. Gardiner Rhyme    [facility]    Staff Present Redge Gainer, BS, Exercise Physiologist;Debra Wynetta Emery, RN, BSN;Other    Virtual Visit No    Medication changes reported     No    Fall or balance concerns reported    Yes    Comments She frequently loses her balance but does not fall due to her knees and hips giving out.    Tobacco Cessation No Change    Warm-up and Cool-down Performed as group-led instruction    Resistance Training Performed Yes    VAD Patient? No    PAD/SET Patient? No      Pain Assessment   Currently in Pain? No/denies    Multiple Pain Sites No             Capillary Blood Glucose: No results found for this or any previous visit (from the past 24 hour(s)).    Social History   Tobacco Use  Smoking Status Former   Packs/day: 1.00   Types: Cigarettes  Smokeless Tobacco Never  Tobacco Comments   cut back to 1 pack a day. Smokig x 46yr. Did smoke 2 packs x 5 yrs, but cut back a month ago.     Goals Met:  Independence with exercise equipment Exercise tolerated well No report of concerns or symptoms today Strength training completed today  Goals Unmet:  Not Applicable  Comments: check out 1200   Dr. JKathie Dikeis Medical Director [facility].

## 2021-08-23 ENCOUNTER — Encounter (HOSPITAL_COMMUNITY)
Admission: RE | Admit: 2021-08-23 | Discharge: 2021-08-23 | Disposition: A | Discharge status: Home / Self Care | Payer: Medicare Other | Source: Ambulatory Visit | Attending: Cardiology | Admitting: Cardiology

## 2021-08-23 DIAGNOSIS — Z951 Presence of aortocoronary bypass graft: Secondary | ICD-10-CM

## 2021-08-23 NOTE — Progress Notes (Signed)
Daily Session Note  Patient Details  Name: Lauren David MRN: [id number] Date of Birth: 03-24-1961 Referring Provider:   Flowsheet Row CARDIAC REHAB PHASE II ORIENTATION from 08/09/2021 in [place]  Referring Provider Dr. Kipp Brood       Encounter Date: 08/23/2021  Check In:  Session Check In - 08/23/21 1100       Check-In   Supervising physician immediately available to respond to emergencies [facility] immediately available    Physician(s) Dr. Audie Box    [facility]    Staff Present Redge Gainer, BS, Exercise Physiologist;Debra Wynetta Emery, RN, Bjorn Loser, MS, ACSM-CEP, Exercise Physiologist;Rotunda Worden, RN    Virtual Visit No    Medication changes reported     No    Fall or balance concerns reported    Yes    Comments She frequently loses her balance but does not fall due to her knees and hips giving out.    Tobacco Cessation No Change    Warm-up and Cool-down Performed as group-led instruction    Resistance Training Performed Yes    VAD Patient? No    PAD/SET Patient? No      Pain Assessment   Currently in Pain? No/denies    Multiple Pain Sites No             Capillary Blood Glucose: No results found for this or any previous visit (from the past 24 hour(s)).    Social History   Tobacco Use  Smoking Status Former   Packs/day: 1.00   Types: Cigarettes  Smokeless Tobacco Never  Tobacco Comments   cut back to 1 pack a day. Smokig x 42yr. Did smoke 2 packs x 5 yrs, but cut back a month ago.     Goals Met:  Independence with exercise equipment Exercise tolerated well No report of concerns or symptoms today Strength training completed today  Goals Unmet:  Not Applicable  Comments: check out @ 12:00   Dr. JKathie Dikeis Medical Director [facility].

## 2021-08-26 ENCOUNTER — Encounter (HOSPITAL_COMMUNITY)
Admission: RE | Admit: 2021-08-26 | Discharge: 2021-08-26 | Disposition: A | Discharge status: Home / Self Care | Payer: Medicare Other | Source: Ambulatory Visit | Attending: Cardiology | Admitting: Cardiology

## 2021-08-26 ENCOUNTER — Other Ambulatory Visit: Payer: Self-pay

## 2021-08-26 DIAGNOSIS — Z951 Presence of aortocoronary bypass graft: Secondary | ICD-10-CM

## 2021-08-26 NOTE — Progress Notes (Signed)
Daily Session Note  Patient Details  Name: Lauren David MRN: [id number] Date of Birth: 08-16-1960 Referring Provider:   Flowsheet Row CARDIAC REHAB PHASE II ORIENTATION from 08/09/2021 in [place]  Referring Provider Dr. Kipp Brood       Encounter Date: 08/26/2021  Check In:  Session Check In - 08/26/21 1100       Check-In   Supervising physician immediately available to respond to emergencies [facility] immediately available    Physician(s) Dr. Radford Pax    [facility]    Staff Present Hoy Register, MS, ACSM-CEP, Exercise Physiologist;Other    Virtual Visit No    Medication changes reported     No    Fall or balance concerns reported    Yes    Comments She frequently loses her balance but does not fall due to her knees and hips giving out.    Tobacco Cessation No Change    Warm-up and Cool-down Performed as group-led instruction    Resistance Training Performed Yes    VAD Patient? No    PAD/SET Patient? No      Pain Assessment   Currently in Pain? No/denies    Multiple Pain Sites No             Capillary Blood Glucose: No results found for this or any previous visit (from the past 24 hour(s)).    Social History   Tobacco Use  Smoking Status Former   Packs/day: 1.00   Types: Cigarettes  Smokeless Tobacco Never  Tobacco Comments   cut back to 1 pack a day. Smokig x 84yr. Did smoke 2 packs x 5 yrs, but cut back a month ago.     Goals Met:  Independence with exercise equipment Exercise tolerated well No report of concerns or symptoms today Strength training completed today  Goals Unmet:  Not Applicable  Comments: checkout time is 1200   Dr. JKathie Dikeis Medical Director for [facility].

## 2021-08-28 ENCOUNTER — Encounter (HOSPITAL_COMMUNITY)
Admission: RE | Admit: 2021-08-28 | Discharge: 2021-08-28 | Disposition: A | Discharge status: Home / Self Care | Payer: Medicare Other | Source: Ambulatory Visit | Attending: Cardiology | Admitting: Cardiology

## 2021-08-28 DIAGNOSIS — Z951 Presence of aortocoronary bypass graft: Secondary | ICD-10-CM

## 2021-08-28 NOTE — Progress Notes (Signed)
Daily Session Note ° °Patient Details  °Name: Lauren David °MRN: [id number] °Date of Birth: 01/10/1961 °Referring Provider:   °Flowsheet Row CARDIAC REHAB PHASE II ORIENTATION from 08/09/2021 in [place] [facility]  °Referring Provider Dr. Lightfoot  ° °  ° ° °Encounter Date: 08/28/2021 ° °Check In: ° Session Check In - 08/28/21 1100   ° °  ° Check-In  ° Supervising physician immediately available to respond to emergencies [facility] immediately available   ° Physician(s) Dr. McDowell   ° [facility]   ° Staff Present Dalton Fletcher, MS, ACSM-CEP, Exercise Physiologist;Other;Atticus Wedin, RN;Heather Jachimiak, BS, Exercise Physiologist;Debra Johnson, RN, BSN   ° Virtual Visit No   ° Medication changes reported     No   ° Fall or balance concerns reported    Yes   ° Comments She frequently loses her balance but does not fall due to her knees and hips giving out.   ° Tobacco Cessation No Change   ° Warm-up and Cool-down Performed as group-led instruction   ° Resistance Training Performed Yes   ° VAD Patient? No   ° PAD/SET Patient? No   °  ° Pain Assessment  ° Currently in Pain? No/denies   ° Multiple Pain Sites No   ° °  °  ° °  ° ° °Capillary Blood Glucose: °No results found for this or any previous visit (from the past 24 hour(s)). ° ° ° °Social History  ° °Tobacco Use  °Smoking Status Former  ° Packs/day: 1.00  ° Types: Cigarettes  °Smokeless Tobacco Never  °Tobacco Comments  ° cut back to 1 pack a day. Smokig x 40yrs. Did smoke 2 packs x 5 yrs, but cut back a month ago.   ° ° °Goals Met:  °Independence with exercise equipment °Exercise tolerated well °No report of concerns or symptoms today °Strength training completed today ° °Goals Unmet:  °Not Applicable ° °Comments: checkout @ 12:00pm ° ° °Dr. Jehanzeb Memon is Medical Director for [facility]. °

## 2021-08-28 NOTE — Progress Notes (Signed)
Daily Session Note  Patient Details  Name: Lauren David MRN: [id number] Date of Birth: 1960-11-07 Referring Provider:   Flowsheet Row CARDIAC REHAB PHASE II ORIENTATION from 08/09/2021 in [place]  Referring Provider Dr. Kipp Brood       Encounter Date: 08/28/2021  Check In:  Session Check In - 08/28/21 1100       Check-In   Supervising physician immediately available to respond to emergencies [facility] immediately available    Physician(s) Dr. Domenic Polite    [facility]    Staff Present Hoy Register, MS, ACSM-CEP, Exercise Physiologist;Other;Khalel Alms, RN;Heather Otho Ket, BS, Exercise Physiologist;Debra Wynetta Emery, RN, BSN    Virtual Visit No    Medication changes reported     No    Fall or balance concerns reported    Yes    Comments She frequently loses her balance but does not fall due to her knees and hips giving out.    Tobacco Cessation No Change    Warm-up and Cool-down Performed as group-led instruction    Resistance Training Performed Yes    VAD Patient? No    PAD/SET Patient? No      Pain Assessment   Currently in Pain? No/denies    Multiple Pain Sites No             Capillary Blood Glucose: No results found for this or any previous visit (from the past 24 hour(s)).    Social History   Tobacco Use  Smoking Status Former   Packs/day: 1.00   Types: Cigarettes  Smokeless Tobacco Never  Tobacco Comments   cut back to 1 pack a day. Smokig x 93yr. Did smoke 2 packs x 5 yrs, but cut back a month ago.     Goals Met:  Independence with exercise equipment Exercise tolerated well No report of concerns or symptoms today Strength training completed today  Goals Unmet:  Not Applicable  Comments: checkout @ 12:00pm   Dr. JKathie Dikeis Medical Director [facility].

## 2021-08-30 ENCOUNTER — Encounter (HOSPITAL_COMMUNITY)
Admission: RE | Admit: 2021-08-30 | Discharge: 2021-08-30 | Disposition: A | Discharge status: Home / Self Care | Payer: Medicare Other | Source: Ambulatory Visit | Attending: Cardiology | Admitting: Cardiology

## 2021-08-30 DIAGNOSIS — Z951 Presence of aortocoronary bypass graft: Secondary | ICD-10-CM | POA: Diagnosis not present

## 2021-08-30 NOTE — Progress Notes (Signed)
I have reviewed a Home Exercise Prescription with Lauren David . Lauren David is not currently exercising at home.  The patient was advised to walk 5 days a week for 30-45 minutes.  Lauren David and I discussed how to progress their exercise prescription.  The patient stated that their goals were build back her strength and energy.  The patient stated that they understand the exercise prescription.  We reviewed exercise guidelines, target heart rate during exercise, RPE Scale, weather conditions, NTG use, endpoints for exercise, warmup and cool down.  Patient is encouraged to come to me with any questions. I will continue to follow up with the patient to assist them with progression and safety.

## 2021-08-30 NOTE — Progress Notes (Signed)
Daily Session Note  Patient Details  Name: Lauren David MRN: [id number] Date of Birth: [birth year] Referring Provider:   Flowsheet Row CARDIAC REHAB PHASE II ORIENTATION from 08/09/2021 in [place]  Referring Provider Dr. Kipp Brood       Encounter Date: 08/30/2021  Check In:  Session Check In - 08/30/21 1059       Check-In   Supervising physician immediately available to respond to emergencies [facility] immediately available    Physician(s) Dr. Domenic Polite    [facility]    Staff Present Redge Gainer, BS, Exercise Physiologist;Bastion Bolger Wynetta Emery, RN, BSN;Other;Dalton Fletcher, MS, ACSM-CEP, Exercise Physiologist    Virtual Visit No    Medication changes reported     No    Fall or balance concerns reported    Yes    Comments She frequently loses her balance but does not fall due to her knees and hips giving out.    Tobacco Cessation No Change    Warm-up and Cool-down Performed as group-led instruction    Resistance Training Performed Yes    VAD Patient? No    PAD/SET Patient? No      Pain Assessment   Currently in Pain? No/denies    Multiple Pain Sites No             Capillary Blood Glucose: No results found for this or any previous visit (from the past 24 hour(s)).    Social History   Tobacco Use  Smoking Status Former   Packs/day: 1.00   Types: Cigarettes  Smokeless Tobacco Never  Tobacco Comments   cut back to 1 pack a day. Smokig x 85yr. Did smoke 2 packs x 5 yrs, but cut back a month ago.     Goals Met:  Independence with exercise equipment Exercise tolerated well No report of concerns or symptoms today Strength training completed today  Goals Unmet:  Not Applicable  Comments: Check out 1200.   Dr. JKathie Dikeis Medical Director for [facility].

## 2021-09-02 ENCOUNTER — Encounter (HOSPITAL_COMMUNITY)
Admission: RE | Admit: 2021-09-02 | Discharge: 2021-09-02 | Disposition: A | Discharge status: Home / Self Care | Payer: Medicare Other | Source: Ambulatory Visit | Attending: Cardiology | Admitting: Cardiology

## 2021-09-02 VITALS — Wt 166.9 lb

## 2021-09-02 DIAGNOSIS — Z951 Presence of aortocoronary bypass graft: Secondary | ICD-10-CM

## 2021-09-02 NOTE — Progress Notes (Signed)
Daily Session Note  Patient Details  Name: Lauren David MRN: [id number] Date of Birth: 1961-01-18 Referring Provider:   Flowsheet Row CARDIAC REHAB PHASE II ORIENTATION from 08/09/2021 in [place]  Referring Provider Dr. Kipp Brood       Encounter Date: 09/02/2021  Check In:  Session Check In - 09/02/21 1100       Check-In   Supervising physician immediately available to respond to emergencies [facility] immediately available    Physician(s) Dr.Pemberton    Location [facility]    Staff Present Geanie Cooley, RN;Dalton Fletcher, MS, ACSM-CEP, Exercise Physiologist;Debra Wynetta Emery, RN, BSN    Virtual Visit No    Medication changes reported     No    Fall or balance concerns reported    Yes    Comments She frequently loses her balance but does not fall due to her knees and hips giving out.    Tobacco Cessation No Change    Warm-up and Cool-down Performed as group-led instruction    Resistance Training Performed Yes    VAD Patient? No    PAD/SET Patient? No      Pain Assessment   Currently in Pain? No/denies    Multiple Pain Sites No             Capillary Blood Glucose: No results found for this or any previous visit (from the past 24 hour(s)).    Social History   Tobacco Use  Smoking Status Former   Packs/day: 1.00   Types: Cigarettes  Smokeless Tobacco Never  Tobacco Comments   cut back to 1 pack a day. Smokig x 17yr. Did smoke 2 packs x 5 yrs, but cut back a month ago.     Goals Met:  Independence with exercise equipment Exercise tolerated well No report of concerns or symptoms today Strength training completed today  Goals Unmet:  Not Applicable  Comments: checkout @ 12:00pm   Dr. JKathie Dikeis Medical Director [facility].

## 2021-09-04 ENCOUNTER — Telehealth: Payer: Self-pay | Admitting: Internal Medicine

## 2021-09-04 ENCOUNTER — Encounter (HOSPITAL_COMMUNITY)
Admission: RE | Admit: 2021-09-04 | Discharge: 2021-09-04 | Disposition: A | Discharge status: Home / Self Care | Payer: Medicare Other | Source: Ambulatory Visit | Attending: Cardiology | Admitting: Cardiology

## 2021-09-04 DIAGNOSIS — Z951 Presence of aortocoronary bypass graft: Secondary | ICD-10-CM | POA: Insufficient documentation

## 2021-09-04 NOTE — Telephone Encounter (Signed)
I have not called pt please find out what she needs

## 2021-09-04 NOTE — Telephone Encounter (Signed)
Pt returning call

## 2021-09-04 NOTE — Progress Notes (Signed)
Cardiac Individual Treatment Plan  Patient Details  Name: Lauren David MRN: [id number] Date of Birth: 01-May-1961 Referring Provider:   Flowsheet Row CARDIAC REHAB PHASE II ORIENTATION from 08/09/2021 in [facility]  Referring Provider Dr. Kipp Brood       Initial Encounter Date:  Flowsheet Row CARDIAC REHAB PHASE II ORIENTATION from 08/09/2021 in [place]  Date 08/09/21       Visit Diagnosis: S/P CABG x 4  Patient's Home Medications on Admission:  Current Outpatient Medications:    acetaminophen (TYLENOL) 500 MG tablet, Take 1,000 mg by mouth every 6 (six) hours as needed for moderate pain or headache., Disp: , Rfl:    albuterol (PROVENTIL) (2.5 MG/3ML) 0.083% nebulizer solution, ONE VIAL BY NEBULIZATION EVERY 6 HOURS AS NEEDED FOR WHEEZING OR SHORTNESS OF BREATH. (Patient taking differently: Take 2.5 mg by nebulization every 6 (six) hours as needed for wheezing.), Disp: 180 mL, Rfl: 0   albuterol (VENTOLIN HFA) 108 (90 Base) MCG/ACT inhaler, INHALE 2 PUFFS INTO THE LUNGS EVERY 4 HOURS AS NEEDED FOR WHEEZING OR SHORTNESS OF BREATH. (Patient taking differently: Inhale 1-2 puffs into the lungs every 4 (four) hours as needed for shortness of breath.), Disp: 17 g, Rfl: 0   amLODipine (NORVASC) 2.5 MG tablet, Take 1 tablet (2.5 mg total) by mouth daily., Disp: 30 tablet, Rfl: 5   aspirin 81 MG chewable tablet, Chew 4 tablets (324 mg total) by mouth daily., Disp: , Rfl:    blood glucose meter kit and supplies, Dispense based on patient and insurance preference. Use up to four times daily as directed. (FOR ICD-10 E10.9, E11.9)., Disp: 1 each, Rfl: 0   dicyclomine (BENTYL) 10 MG capsule, Take 1 capsule (10 mg total) by mouth 3 (three) times daily as needed. (Patient taking differently: Take 10 mg by mouth 3 (three) times daily as needed for spasms.), Disp: 60 capsule, Rfl: 1   empagliflozin (JARDIANCE) 10 MG TABS tablet, Take 1 tablet (10 mg total) by  mouth daily before breakfast., Disp: 30 tablet, Rfl: 2   furosemide (LASIX) 40 MG tablet, Take 1 tablet (40 mg total) by mouth 2 (two) times daily. For one week then stop. (Patient not taking: Reported on 08/09/2021), Disp: 7 tablet, Rfl: 0   glipiZIDE (GLUCOTROL) 5 MG tablet, Take 1 tablet (5 mg total) by mouth 2 (two) times daily before a meal., Disp: 60 tablet, Rfl: 3   JANUVIA 100 MG tablet, TAKE 1 TABLET ONCE DAILY. (Patient taking differently: Take 100 mg by mouth daily.), Disp: 30 tablet, Rfl: 0   metoprolol tartrate (LOPRESSOR) 25 MG tablet, Take 0.5 tablets (12.5 mg total) by mouth 2 (two) times daily., Disp: 60 tablet, Rfl: 1   nicotine (NICODERM CQ - DOSED IN MG/24 HOURS) 14 mg/24hr patch, Place 1 patch (14 mg total) onto the skin daily., Disp: 28 patch, Rfl: 1   rosuvastatin (CRESTOR) 20 MG tablet, Take 1 tablet (20 mg total) by mouth daily., Disp: 30 tablet, Rfl: 6   traMADol (ULTRAM) 50 MG tablet, Take 1 tablet (50 mg total) by mouth every 6 (six) hours as needed for moderate pain. (Patient not taking: Reported on 08/09/2021), Disp: 30 tablet, Rfl: 0   umeclidinium bromide (INCRUSE ELLIPTA) 62.5 MCG/INH AEPB, Inhale 1 puff into the lungs daily., Disp: 30 each, Rfl: 2   varenicline (CHANTIX) 0.5 MG tablet, Take 1 tablet (0.5 mg total) by mouth 2 (two) times daily. 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 Smokig x 40yrs. Did smoke 2 packs x 5 yrs, but cut back a month ago.   ° ° °Labs: °Recent Review Flowsheet Data   ° ° Labs for ITP Cardiac and Pulmonary Rehab Latest Ref Rng & Units 05/21/2021 05/21/2021 05/21/2021 05/21/2021 05/22/2021  ° Cholestrol 0 - 200 mg/dL - - - - 105  ° LDLCALC 0 - 99 mg/dL - - - - 64  ° HDL >40 mg/dL - - - - 27(L)  ° Trlycerides <150 mg/dL - - - - 72  ° Hemoglobin A1c 4.8 - 5.6 % - - - - -  ° PHART 7.350 - 7.450 7.311(L) 7.170(LL) 7.293(L) 7.306(L) -  ° PCO2ART 32.0 - 48.0 mmHg 50.5(H) 72.4(HH) 46.2 48.4(H) -  ° HCO3 20.0 - 28.0 mmol/L 25.5 26.4 22.3 24.1 -  ° TCO2 22 - 32 mmol/L 27 29 24 26 -  ° ACIDBASEDEF 0.0 - 2.0 mmol/L 1.0 3.0(H) 4.0(H) 2.0 -  ° O2SAT % 100.0 94.0 92.0 92.0 -  ° °  ° ° °Capillary Blood Glucose: °Lab Results  °Component Value Date  ° GLUCAP 202 (H) 08/09/2021  ° GLUCAP 196 (H) 05/26/2021  ° GLUCAP 124 (H) 05/26/2021  ° GLUCAP 150 (H) 05/25/2021  ° GLUCAP 141 (H) 05/25/2021  ° ° ° °Exercise Target Goals: °Exercise Program Goal: °Individual exercise prescription set using results from initial 6 min walk test and THRR while considering  patient’s activity barriers and safety.  ° °Exercise Prescription Goal: °Starting with aerobic activity 30 plus minutes a day, 3 days per week for initial exercise prescription. Provide home exercise prescription and guidelines that participant acknowledges understanding prior to discharge. ° °Activity Barriers & Risk Stratification: ° Activity Barriers & Cardiac Risk Stratification - 08/09/21 1324   ° °  ° Activity Barriers & Cardiac Risk Stratification  ° Activity Barriers Arthritis;Back Problems;Joint Problems;Deconditioning;Shortness of Breath;Balance Concerns   ° Cardiac Risk Stratification High   ° °  °  ° °  ° ° °6 Minute Walk: ° 6 Minute Walk   ° ° Row Name 08/09/21 1457  °  °  °  ° 6 Minute Walk  ° Phase Initial    ° Distance 1150 feet    ° Walk  Time 6 minutes    ° # of Rest Breaks 0    ° MPH 2.18    ° METS 2.41    ° RPE 13    ° VO2 Peak 8.43    ° Symptoms Yes (comment)    ° Comments R hip pain 3/10    ° Resting HR 70 bpm    ° Resting BP 102/56    ° Resting Oxygen Saturation  98 %    °   Exercise Oxygen Saturation  during 6 min walk 98 %    ° Max Ex. HR 71 bpm    ° Max Ex. BP 118/66    ° 2 Minute Post BP 104/58    ° °  °  ° °  ° ° °Oxygen Initial Assessment: ° ° °Oxygen Re-Evaluation: ° ° °Oxygen Discharge (Final Oxygen Re-Evaluation): ° ° °Initial Exercise Prescription: ° Initial Exercise Prescription - 08/09/21 1400   ° °  ° Date of Initial Exercise RX and Referring Provider  ° Date 08/09/21   ° Referring Provider Dr. Lightfoot   ° Expected Discharge Date 11/01/21   °  ° Treadmill  ° MPH 1   ° Grade 0   ° Minutes 17   °  ° NuStep  ° Level 1   ° SPM 80   ° Minutes 22   °  ° Prescription Details  ° Frequency (times per week) 3   ° Duration Progress to 30 minutes of continuous aerobic without signs/symptoms of physical distress   °  ° Intensity  ° THRR 40-80% of Max Heartrate 63-126   ° Ratings of Perceived Exertion 11-13   ° Perceived Dyspnea 0-4   °  ° Resistance Training  ° Training Prescription Yes   ° Weight 2 lbs   ° Reps 10-15   ° °  °  ° °  ° ° °Perform Capillary Blood Glucose checks as needed. ° °Exercise Prescription Changes: ° ° Exercise Prescription Changes   ° ° Row Name 08/19/21 1300 08/30/21 1100 09/02/21 1300  °  °  °  ° Response to Exercise  ° Blood Pressure (Admit) 120/60 -- 128/60    ° Blood Pressure (Exercise) 125/65 -- 110/66    ° Blood Pressure (Exit) 120/60 -- 110/60    ° Heart Rate (Admit) 70 bpm -- 68 bpm    ° Heart Rate (Exercise) 79 bpm -- 76 bpm    ° Heart Rate (Exit) 79 bpm -- 77 bpm    ° Rating of Perceived Exertion (Exercise) 12 -- 12    ° Duration Continue with 30 min of aerobic exercise without signs/symptoms of physical distress. -- Continue with 30 min of aerobic exercise without signs/symptoms of physical distress.    °  Intensity THRR unchanged -- THRR unchanged    °  ° Progression  ° Progression Continue to progress workloads to maintain intensity without signs/symptoms of physical distress. -- Continue to progress workloads to maintain intensity without signs/symptoms of physical distress.    °  ° Resistance Training  ° Training Prescription Yes -- Yes    ° Weight 2 lbs -- 2    ° Reps 10-15 -- 10-15    ° Time 10 Minutes -- 10 Minutes    °  ° Treadmill  ° MPH 1 -- 1.2    ° Grade 0 -- 0    ° Minutes 17 -- 17    ° METs 1.77 -- 1.92    °  ° NuStep  ° Level 1 -- 1    ° SPM 64 -- 78    ° Minutes 22 -- 22    ° METs 1.58 -- 1.7    °  ° Home Exercise Plan  ° Plans to continue exercise at -- Home (comment) --    ° Frequency -- Add 2 additional days to program exercise sessions. --    ° Initial Home Exercises Provided -- 08/30/21 --    ° °  °  ° °  ° ° °  Exercise Comments: ° ° Exercise Comments   ° ° Row Name 08/30/21 1140  °  °  °  °  ° Exercise Comments home exercise reviewed      ° °  °  ° °  ° ° °Exercise Goals and Review: ° ° Exercise Goals   ° ° Row Name 08/09/21 1500 09/02/21 1357  °  °  °  °  ° Exercise Goals  ° Increase Physical Activity Yes Yes     ° Intervention Provide advice, education, support and counseling about physical activity/exercise needs.;Develop an individualized exercise prescription for aerobic and resistive training based on initial evaluation findings, risk stratification, comorbidities and participant's personal goals. Provide advice, education, support and counseling about physical activity/exercise needs.;Develop an individualized exercise prescription for aerobic and resistive training based on initial evaluation findings, risk stratification, comorbidities and participant's personal goals.     ° Expected Outcomes Short Term: Attend rehab on a regular basis to increase amount of physical activity.;Long Term: Add in home exercise to make exercise part of routine and to increase amount of physical activity.;Long  Term: Exercising regularly at least 3-5 days a week. Short Term: Attend rehab on a regular basis to increase amount of physical activity.;Long Term: Add in home exercise to make exercise part of routine and to increase amount of physical activity.;Long Term: Exercising regularly at least 3-5 days a week.     ° Increase Strength and Stamina Yes Yes     ° Intervention Provide advice, education, support and counseling about physical activity/exercise needs.;Develop an individualized exercise prescription for aerobic and resistive training based on initial evaluation findings, risk stratification, comorbidities and participant's personal goals. Provide advice, education, support and counseling about physical activity/exercise needs.;Develop an individualized exercise prescription for aerobic and resistive training based on initial evaluation findings, risk stratification, comorbidities and participant's personal goals.     ° Expected Outcomes Short Term: Increase workloads from initial exercise prescription for resistance, speed, and METs.;Short Term: Perform resistance training exercises routinely during rehab and add in resistance training at home;Long Term: Improve cardiorespiratory fitness, muscular endurance and strength as measured by increased METs and functional capacity (6MWT) Short Term: Increase workloads from initial exercise prescription for resistance, speed, and METs.;Short Term: Perform resistance training exercises routinely during rehab and add in resistance training at home;Long Term: Improve cardiorespiratory fitness, muscular endurance and strength as measured by increased METs and functional capacity (6MWT)     ° Able to understand and use rate of perceived exertion (RPE) scale Yes Yes     ° Intervention Provide education and explanation on how to use RPE scale Provide education and explanation on how to use RPE scale     ° Expected Outcomes Short Term: Able to use RPE daily in rehab to express  subjective intensity level;Long Term:  Able to use RPE to guide intensity level when exercising independently Short Term: Able to use RPE daily in rehab to express subjective intensity level;Long Term:  Able to use RPE to guide intensity level when exercising independently     ° Knowledge and understanding of Target Heart Rate Range (THRR) Yes Yes     ° Intervention Provide education and explanation of THRR including how the numbers were predicted and where they are located for reference Provide education and explanation of THRR including how the numbers were predicted and where they are located for reference     ° Expected Outcomes Short Term: Able to state/look up THRR;Long Term: Able to use THRR   to govern intensity when exercising independently;Short Term: Able to use daily as guideline for intensity in rehab Short Term: Able to state/look up THRR;Long Term: Able to use THRR to govern intensity when exercising independently;Short Term: Able to use daily as guideline for intensity in rehab     ° Able to check pulse independently Yes Yes     ° Intervention Provide education and demonstration on how to check pulse in carotid and radial arteries.;Review the importance of being able to check your own pulse for safety during independent exercise Provide education and demonstration on how to check pulse in carotid and radial arteries.;Review the importance of being able to check your own pulse for safety during independent exercise     ° Expected Outcomes Short Term: Able to explain why pulse checking is important during independent exercise;Long Term: Able to check pulse independently and accurately Short Term: Able to explain why pulse checking is important during independent exercise;Long Term: Able to check pulse independently and accurately     ° Understanding of Exercise Prescription Yes Yes     ° Intervention Provide education, explanation, and written materials on patient's individual exercise prescription  Provide education, explanation, and written materials on patient's individual exercise prescription     ° Expected Outcomes Short Term: Able to explain program exercise prescription;Long Term: Able to explain home exercise prescription to exercise independently Short Term: Able to explain program exercise prescription;Long Term: Able to explain home exercise prescription to exercise independently     ° °  °  ° °  ° ° °Exercise Goals Re-Evaluation : ° Exercise Goals Re-Evaluation   ° ° Row Name 09/02/21 1357  °  °  °  °  °  ° Exercise Goal Re-Evaluation  ° Exercise Goals Review Increase Strength and Stamina;Increase Physical Activity;Able to understand and use rate of perceived exertion (RPE) scale;Knowledge and understanding of Target Heart Rate Range (THRR);Able to check pulse independently;Understanding of Exercise Prescription      ° Comments Pt has completed 11 sessions of cardiac rehab. She is progressing well and is motivated to improve her fitness. She is currently exercising at 1.92 METs. Will continue to monitor and progress as able.      ° Expected Outcomes Through exercise at home and at rehab, the patient will meet their stated goals.      ° °  °  ° °  ° ° ° °Discharge Exercise Prescription (Final Exercise Prescription Changes): ° Exercise Prescription Changes - 09/02/21 1300   ° °  ° Response to Exercise  ° Blood Pressure (Admit) 128/60   ° Blood Pressure (Exercise) 110/66   ° Blood Pressure (Exit) 110/60   ° Heart Rate (Admit) 68 bpm   ° Heart Rate (Exercise) 76 bpm   ° Heart Rate (Exit) 77 bpm   ° Rating of Perceived Exertion (Exercise) 12   ° Duration Continue with 30 min of aerobic exercise without signs/symptoms of physical distress.   ° Intensity THRR unchanged   °  ° Progression  ° Progression Continue to progress workloads to maintain intensity without signs/symptoms of physical distress.   °  ° Resistance Training  ° Training Prescription Yes   ° Weight 2   ° Reps 10-15   ° Time 10 Minutes   °   ° Treadmill  ° MPH 1.2   ° Grade 0   ° Minutes 17   ° METs 1.92   °  ° NuStep  ° Level 1   °   SPM 78    Minutes 22    METs 1.7             Nutrition:  Target Goals: Understanding of nutrition guidelines, daily intake of sodium <1546m, cholesterol <2072m calories 30% from fat and 7% or less from saturated fats, daily to have 5 or more servings of fruits and vegetables.  Biometrics:  Pre Biometrics - 08/09/21 1500       Pre Biometrics   Height 5' 1" (1.549 m)    Weight 75.8 kg    Waist Circumference 41.5 inches    Hip Circumference 44.5 inches    Waist to Hip Ratio 0.93 %    BMI (Calculated) 31.59    Triceps Skinfold 29 mm    % Body Fat 43.7 %    Grip Strength 25.1 kg    Flexibility 10.5 in    Single Leg Stand 10 seconds              Nutrition Therapy Plan and Nutrition Goals:  Nutrition Therapy & Goals - 08/26/21 0909       Personal Nutrition Goals   Comments Patient socred 24 on her diet assessment. We offer 2 educational sessions on heart healthy nutrition with handouts and assistance with RD referrals if patient is interested.      Intervention Plan   Intervention Nutrition handout(s) given to patient.    Expected Outcomes Short Term Goal: Understand basic principles of dietary content, such as calories, fat, sodium, cholesterol and nutrients.             Nutrition Assessments:  Nutrition Assessments - 08/09/21 1331       MEDFICTS Scores   Pre Score 24            MEDIFICTS Score Key: ?70 Need to make dietary changes  40-70 Heart Healthy Diet ? 40 Therapeutic Level Cholesterol Diet   Picture Your Plate Scores: <4<02nhealthy dietary pattern with much room for improvement. 41-50 Dietary pattern unlikely to meet recommendations for good health and room for improvement. 51-60 More healthful dietary pattern, with some room for improvement.  >60 Healthy dietary pattern, although there may be some specific behaviors that could be improved.     Nutrition Goals Re-Evaluation:   Nutrition Goals Discharge (Final Nutrition Goals Re-Evaluation):   Psychosocial: Target Goals: Acknowledge presence or absence of significant depression and/or stress, maximize coping skills, provide positive support system. Participant is able to verbalize types and ability to use techniques and skills needed for reducing stress and depression.  Initial Review & Psychosocial Screening:  Initial Psych Review & Screening - 08/09/21 1328       Initial Review   Current issues with Current Anxiety/Panic;Current Sleep Concerns      Family Dynamics   Good Support System? Yes    Comments Her support system include three of her grandchildren who live with her. One of her daughters also supports her.      Barriers   Psychosocial barriers to participate in program There are no identifiable barriers or psychosocial needs.      Screening Interventions   Interventions Encouraged to exercise;Provide feedback about the scores to participant    Expected Outcomes Long Term goal: The participant improves quality of Life and PHQ9 Scores as seen by post scores and/or verbalization of changes;Short Term goal: Identification and review with participant of any Quality of Life or Depression concerns found by scoring the questionnaire.  Quality of Life Scores: ° Quality of Life - 08/09/21 1503   ° °  ° Quality of Life  ° Select Quality of Life   °  ° Quality of Life Scores  ° Health/Function Pre 24.43 %   ° Socioeconomic Pre 29.14 %   ° Psych/Spiritual Pre 27.36 %   ° Family Pre 27 %   ° GLOBAL Pre 26.42 %   ° °  °  ° °  ° °Scores of 19 and below usually indicate a poorer quality of life in these areas.  A difference of  2-3 points is a clinically meaningful difference.  A difference of 2-3 points in the total score of the Quality of Life Index has been associated with significant improvement in overall quality of life, self-image, physical symptoms, and  general health in studies assessing change in quality of life. ° °PHQ-9: °Recent Review Flowsheet Data   ° ° Depression screen PHQ 2/9 08/09/2021 06/06/2021 03/11/2021 03/11/2021 09/10/2020  ° Decreased Interest 0 0 0 0 0  ° Down, Depressed, Hopeless 0 0 0 0 0  ° PHQ - 2 Score 0 0 0 0 0  ° Altered sleeping 2  - - - -  ° Tired, decreased energy 1 - - - -  ° Change in appetite 1 - - - -  ° Feeling bad or failure about yourself  0 - - - -  ° Trouble concentrating 0 - - - -  ° Moving slowly or fidgety/restless 0 - - - -  ° Suicidal thoughts 0 - - - -  ° PHQ-9 Score 4 - - - -  ° Difficult doing work/chores Not difficult at all - - - -  ° °  ° °Interpretation of Total Score  °Total Score Depression Severity:  °1-4 = Minimal depression, 5-9 = Mild depression, 10-14 = Moderate depression, 15-19 = Moderately severe depression, 20-27 = Severe depression  ° °Psychosocial Evaluation and Intervention: ° Psychosocial Evaluation - 08/09/21 1503   ° °  ° Psychosocial Evaluation & Interventions  ° Interventions Stress management education;Relaxation education;Encouraged to exercise with the program and follow exercise prescription   ° Comments Pt has no barriers to completing cardiac rehab. She does have anxiety but no other psychosocial concerns. She stated that she has been on medication and seen a therapist in the past, but that she no longer requires it anymore. She states that she has her anxiety under control. She does report that she has trouble with her sleep, but does not have any specific reason as to why, nor does she take anything for sleep. She reports that she has a good support system with her daughter and her three adult grand children who live with her. Her goals in the program are to lose weight, gain strength, and to improve her mobility. Another goal is to continue to to improve her diabetic management. She has made improvements with her diet and is now taking all of her prescribed medications for this. She is eager to  start the program.   ° Expected Outcomes Pt's anxiety will continue to be controlled and she will have no other identfiable psychosocial issues.   ° Continue Psychosocial Services  No Follow up required   ° °  °  ° °  ° ° °Psychosocial Re-Evaluation: ° Psychosocial Re-Evaluation   ° ° Row Name 08/26/21 0913  °  °  °  °  °  ° Psychosocial Re-Evaluation  ° Current issues with Current Anxiety/Panic;Current   Sleep Concerns      ° Comments Patient is new to the program completing 7 sessions. She continues to have no psychosocial barriers identified. She seems to enjoy coming to the program and demonstrates an interest in improving her health. We will continue to montior her progress.      ° Expected Outcomes Patient will continue to have no psychosocial barriers identified.      ° Interventions Stress management education;Encouraged to attend Cardiac Rehabilitation for the exercise;Relaxation education      ° Continue Psychosocial Services  No Follow up required      ° °  °  ° °  ° ° °Psychosocial Discharge (Final Psychosocial Re-Evaluation): ° Psychosocial Re-Evaluation - 08/26/21 0913   ° °  ° Psychosocial Re-Evaluation  ° Current issues with Current Anxiety/Panic;Current Sleep Concerns   ° Comments Patient is new to the program completing 7 sessions. She continues to have no psychosocial barriers identified. She seems to enjoy coming to the program and demonstrates an interest in improving her health. We will continue to montior her progress.   ° Expected Outcomes Patient will continue to have no psychosocial barriers identified.   ° Interventions Stress management education;Encouraged to attend Cardiac Rehabilitation for the exercise;Relaxation education   ° Continue Psychosocial Services  No Follow up required   ° °  °  ° °  ° ° °Vocational Rehabilitation: °Provide vocational rehab assistance to qualifying candidates. ° ° Vocational Rehab Evaluation & Intervention: ° Vocational Rehab - 08/09/21 1335   ° °  ° Initial  Vocational Rehab Evaluation & Intervention  ° Assessment shows need for Vocational Rehabilitation No   on disability  ° °  °  ° °  ° ° °Education: °Education Goals: Education classes will be provided on a weekly basis, covering required topics. Participant will state understanding/return demonstration of topics presented. 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 Discus how high blood pressure affects the body and ways to reduce high blood pressure. °Flowsheet Row CARDIAC REHAB PHASE II EXERCISE from 08/28/2021 in [place] [facility]  °Date 08/14/21  °Educator HJ  °Instruction Review Code 1- Verbalizes Understanding  ° °  ° ° °Exercise and Your Heart °-Discuss why it is important to exercise, the FITT principles of exercise, normal and abnormal responses to exercise, and how to exercise safely. °Flowsheet Row CARDIAC REHAB PHASE II EXERCISE from 08/28/2021 in [place] [facility]  °Date 08/21/21  °Educator HJ  °Instruction Review Code 2- Demonstrated Understanding  ° °  ° ° °Angina °-Discuss definition of angina, causes of angina, treatment of angina, and how to decrease risk of having angina. °Flowsheet Row CARDIAC REHAB PHASE II EXERCISE from 08/28/2021 in [place] [facility]  °Date 08/28/21  °Educator pb  °Instruction Review Code 1- Verbalizes Understanding  ° °  ° ° °Cardiac Medications °-Review what the following cardiac medications are used for, how they affect the body, and side effects that may occur when taking the medications.  Medications include Aspirin, Beta blockers, calcium channel blockers, ACE Inhibitors, angiotensin receptor blockers, diuretics, digoxin,  and antihyperlipidemics. ° ° °Congestive Heart Failure °-Discuss the definition of CHF, how to live with CHF,   the signs and symptoms of CHF, and how keep track of weight and sodium intake. ° ° °Heart Disease and Intimacy °-Discus the effect sexual activity has on the heart, how changes occur during intimacy as we age, and safety during sexual activity. ° ° °Smoking Cessation / COPD °-Discuss different methods to quit smoking, the health benefits of quitting smoking, and the definition of COPD. ° ° °Nutrition I: Fats °-Discuss the types of cholesterol, what cholesterol does to the heart, and how cholesterol levels can be controlled. ° ° °Nutrition II: Labels °-Discuss the different components of food labels and how to read food label ° ° °Heart Parts/Heart Disease and PAD °-Discuss the anatomy of the heart, the pathway of blood circulation through the heart, and these are affected by heart disease. ° ° °Stress I: Signs and Symptoms °-Discuss the causes of stress, how stress may lead to anxiety and depression, and ways to limit stress. ° ° °Stress II: Relaxation °-Discuss different types of relaxation techniques to limit stress. ° ° °Warning Signs of Stroke / TIA °-Discuss definition of a stroke, what the signs and symptoms are of a stroke, and how to identify when someone is having stroke. ° ° °Knowledge Questionnaire Score: ° Knowledge Questionnaire Score - 08/09/21 1333   ° °  ° Knowledge Questionnaire Score  ° Pre Score 22/24   ° °  °  ° °  ° ° °Core Components/Risk Factors/Patient Goals at Admission: ° Personal Goals and Risk Factors at Admission - 08/09/21 1335   ° °  ° Core Components/Risk Factors/Patient Goals on Admission  °  Weight Management Yes;Weight Loss;Obesity   ° Intervention Weight Management: Develop a combined nutrition and exercise program designed to reach desired caloric intake, while maintaining appropriate intake of nutrient and fiber, sodium and fats, and appropriate energy expenditure  required for the weight goal.;Weight Management: Provide education and appropriate resources to help participant work on and attain dietary goals.;Weight Management/Obesity: Establish reasonable short term and long term weight goals.;Obesity: Provide education and appropriate resources to help participant work on and attain dietary goals.   ° Expected Outcomes Short Term: Continue to assess and modify interventions until short term weight is achieved;Long Term: Adherence to nutrition and physical activity/exercise program aimed toward attainment of established weight goal;Weight Maintenance: Understanding of the daily nutrition guidelines, which includes 25-35% calories from fat, 7% or less cal from saturated fats, less than 200mg cholesterol, less than 1.5gm of sodium, & 5 or more servings of fruits and vegetables daily;Weight Loss: Understanding of general recommendations for a balanced deficit meal plan, which promotes 1-2 lb weight loss per week and includes a negative energy balance of 500-1000 kcal/d;Understanding recommendations for meals to include 15-35% energy as protein, 25-35% energy from fat, 35-60% energy from carbohydrates, less than 200mg of dietary cholesterol, 20-35 gm of total fiber daily;Understanding of distribution of calorie intake throughout the day with the consumption of 4-5 meals/snacks   ° Improve shortness of breath with ADL's Yes   ° Intervention Provide education, individualized exercise plan and daily activity instruction to help decrease symptoms of SOB with activities of daily living.   ° Expected Outcomes Short Term: Improve cardiorespiratory fitness to achieve a reduction of symptoms when performing ADLs;Long Term: Be able to perform more ADLs without symptoms or delay the onset of symptoms   ° Diabetes Yes   ° Intervention Provide education about signs/symptoms and action to take for hypo/hyperglycemia.;Provide education about proper nutrition, including hydration, and  aerobic/resistive exercise prescription   along with prescribed medications to achieve blood glucose in normal ranges: Fasting glucose 65-99 mg/dL   ° Expected Outcomes Long Term: Attainment of HbA1C < 7%.;Short Term: Participant verbalizes understanding of the signs/symptoms and immediate care of hyper/hypoglycemia, proper foot care and importance of medication, aerobic/resistive exercise and nutrition plan for blood glucose control.   ° Personal Goal Other Yes   ° Personal Goal improve strength and mobility   ° Intervention Attend cardiac rehab and begin a home exercise program.   ° Expected Outcomes Pt will be able to improve their strength and stamina   ° °  °  ° °  ° ° °Core Components/Risk Factors/Patient Goals Review:  ° Goals and Risk Factor Review   ° ° Row Name 08/26/21 0910  °  °  °  °  °  ° Core Components/Risk Factors/Patient Goals Review  ° Personal Goals Review Weight Management/Obesity;Diabetes;Other      ° Review Patient was referred to CR with [facility]. She has multiple risk factors for CAD and is participating in the program for risk modification. She has completed 7 sessions. Her initial weight was 167.0 lbs and her current weight is 165.2 lbs. Her blood pressure is well controlled. Her personal goals for the program are to improve her strength nad mobility and to lose weight. We will continue to monitor her progress as she works towards meeting these goals.      ° Expected Outcomes Patient will complete the program meeting both personal and program goals.      ° °  °  ° °  ° ° °Core Components/Risk Factors/Patient Goals at Discharge (Final Review):  ° Goals and Risk Factor Review - 08/26/21 0910   ° °  ° Core Components/Risk Factors/Patient Goals Review  ° Personal Goals Review Weight Management/Obesity;Diabetes;Other   ° Review Patient was referred to CR with [facility]. She has multiple risk factors for CAD and is participating in the program for risk modification. She has completed 7 sessions. Her  initial weight was 167.0 lbs and her current weight is 165.2 lbs. Her blood pressure is well controlled. Her personal goals for the program are to improve her strength nad mobility and to lose weight. We will continue to monitor her progress as she works towards meeting these goals.   ° Expected Outcomes Patient will complete the program meeting both personal and program goals.   ° °  °  ° °  ° ° °ITP Comments: ° ° °Comments: ITP REVIEW °Pt is making expected progress toward Cardiac Rehab goals after completing 11 sessions. Recommend continued exercise, life style modification, education, and increased stamina and strength. ° °

## 2021-09-04 NOTE — Progress Notes (Signed)
Daily Session Note  Patient Details  Name: Lauren David MRN: [id number] Date of Birth: 13-May-1961 Referring Provider:   Flowsheet Row CARDIAC REHAB PHASE II ORIENTATION from 08/09/2021 in [place]  Referring Provider Dr. Kipp Brood       Encounter Date: 09/04/2021  Check In:  Session Check In - 09/04/21 1100       Check-In   Supervising physician immediately available to respond to emergencies [facility] MD immediately available    Physician(s) Dr. Harl Bowie    Location [facility]    Staff Present Aundra Dubin, RN, BSN;Heather Otho Ket, BS, Exercise Physiologist;Parlee Amescua Kris Mouton, MS, ACSM-CEP, Exercise Physiologist;Other    Virtual Visit No    Medication changes reported     No    Fall or balance concerns reported    Yes    Comments She frequently loses her balance but does not fall due to her knees and hips giving out.    Tobacco Cessation No Change    Warm-up and Cool-down Performed as group-led instruction    Resistance Training Performed Yes    VAD Patient? No    PAD/SET Patient? No      Pain Assessment   Currently in Pain? No/denies    Multiple Pain Sites No             Capillary Blood Glucose: No results found for this or any previous visit (from the past 24 hour(s)).    Social History   Tobacco Use  Smoking Status Former   Packs/day: 1.00   Types: Cigarettes  Smokeless Tobacco Never  Tobacco Comments   cut back to 1 pack a day. Smokig x 40yr. Did smoke 2 packs x 5 yrs, but cut back a month ago.     Goals Met:  Independence with exercise equipment Exercise tolerated well No report of concerns or symptoms today Strength training completed today  Goals Unmet:  Not Applicable  Comments: checkout time is 1200   Dr. JKathie Dikeis Medical Director for [facility].

## 2021-09-06 ENCOUNTER — Encounter (HOSPITAL_COMMUNITY)
Admission: RE | Admit: 2021-09-06 | Discharge: 2021-09-06 | Disposition: A | Discharge status: Home / Self Care | Payer: Medicare Other | Source: Ambulatory Visit | Attending: Cardiology | Admitting: Cardiology

## 2021-09-06 DIAGNOSIS — Z951 Presence of aortocoronary bypass graft: Secondary | ICD-10-CM | POA: Diagnosis not present

## 2021-09-06 NOTE — Progress Notes (Signed)
Daily Session Note  Patient Details  Name: Lauren David MRN: [id number] Date of Birth: 1960-09-03 Referring Provider:   Flowsheet Row CARDIAC REHAB PHASE II ORIENTATION from 08/09/2021 in [place]  Referring Provider Dr. Kipp Brood       Encounter Date: 09/06/2021  Check In:  Session Check In - 09/06/21 1100       Check-In   Supervising physician immediately available to respond to emergencies [facility] MD immediately available    Physician(s) Dr. Harrington Challenger    [facility]    Staff Present Redge Gainer, BS, Exercise Physiologist;Yao Hyppolite Kris Mouton, MS, ACSM-CEP, Exercise Physiologist;Debra Wynetta Emery, RN, BSN    Virtual Visit No    Medication changes reported     No    Fall or balance concerns reported    Yes    Comments She frequently loses her balance but does not fall due to her knees and hips giving out.    Tobacco Cessation No Change    Warm-up and Cool-down Performed as group-led instruction    Resistance Training Performed Yes    VAD Patient? No    PAD/SET Patient? No      Pain Assessment   Currently in Pain? No/denies    Multiple Pain Sites No             Capillary Blood Glucose: No results found for this or any previous visit (from the past 24 hour(s)).    Social History   Tobacco Use  Smoking Status Former   Packs/day: 1.00   Types: Cigarettes  Smokeless Tobacco Never  Tobacco Comments   cut back to 1 pack a day. Smokig x 2yr. Did smoke 2 packs x 5 yrs, but cut back a month ago.     Goals Met:  Independence with exercise equipment Exercise tolerated well No report of concerns or symptoms today Strength training completed today  Goals Unmet:  Not Applicable  Comments: checkout time is 1200   Dr. JKathie Dikeis Medical Director [facility].

## 2021-09-09 ENCOUNTER — Ambulatory Visit: Payer: Medicare Other | Admitting: Internal Medicine

## 2021-09-09 ENCOUNTER — Encounter (HOSPITAL_COMMUNITY)
Admission: RE | Admit: 2021-09-09 | Discharge: 2021-09-09 | Disposition: A | Discharge status: Home / Self Care | Payer: Medicare Other | Source: Ambulatory Visit | Attending: Cardiology | Admitting: Cardiology

## 2021-09-09 DIAGNOSIS — Z951 Presence of aortocoronary bypass graft: Secondary | ICD-10-CM

## 2021-09-09 NOTE — Progress Notes (Signed)
Daily Session Note  Patient Details  Name: ELLICE David MRN: [id number] Date of Birth: Feb 28, 1961 Referring Provider:   Flowsheet Row CARDIAC REHAB PHASE II ORIENTATION from 08/09/2021 in [place]  Referring Provider Dr. Kipp Brood       Encounter Date: 09/09/2021  Check In:  Session Check In - 09/09/21 1045       Check-In   Supervising physician immediately available to respond to emergencies [facility] MD immediately available    Physician(s) Dr. Audie Box    Location [facility]    Staff Present Redge Gainer, BS, Exercise Physiologist;Quintez Maselli Kris Mouton, MS, ACSM-CEP, Exercise Physiologist;Debra Wynetta Emery, RN, BSN    Virtual Visit No    Medication changes reported     No    Fall or balance concerns reported    Yes    Comments She frequently loses her balance but does not fall due to her knees and hips giving out.    Tobacco Cessation No Change    Warm-up and Cool-down Performed as group-led instruction    Resistance Training Performed Yes    VAD Patient? No    PAD/SET Patient? No      Pain Assessment   Currently in Pain? No/denies    Multiple Pain Sites No             Capillary Blood Glucose: No results found for this or any previous visit (from the past 24 hour(s)).    Social History   Tobacco Use  Smoking Status Former   Packs/day: 1.00   Types: Cigarettes  Smokeless Tobacco Never  Tobacco Comments   cut back to 1 pack a day. Smokig x 74yr. Did smoke 2 packs x 5 yrs, but cut back a month ago.     Goals Met:  Independence with exercise equipment Exercise tolerated well No report of concerns or symptoms today Strength training completed today  Goals Unmet:  Not Applicable  Comments: checkout time is 1145   Dr. JKathie Dikeis [facility].

## 2021-09-11 ENCOUNTER — Encounter (HOSPITAL_COMMUNITY)
Admission: RE | Admit: 2021-09-11 | Discharge: 2021-09-11 | Disposition: A | Discharge status: Home / Self Care | Payer: Medicare Other | Source: Ambulatory Visit | Attending: Cardiology | Admitting: Cardiology

## 2021-09-11 DIAGNOSIS — Z951 Presence of aortocoronary bypass graft: Secondary | ICD-10-CM | POA: Diagnosis not present

## 2021-09-11 NOTE — Progress Notes (Addendum)
Daily Session Note  Patient Details  Name: Lauren David MRN: [id number] Date of Birth: [birth year] Referring Provider:   Flowsheet Row CARDIAC REHAB PHASE II ORIENTATION from 08/09/2021 in [place]  Referring Provider Dr. Kipp Brood       Encounter Date: 09/11/2021  Check In:  Session Check In - 09/11/21 1100       Check-In   Supervising physician immediately available to respond to emergencies [facility] immediately available    Physician(s) Dr. Johnsie Cancel    [facility]    Staff Present Redge Gainer, BS, Exercise Physiologist;Zaniel Marineau Kris Mouton, MS, ACSM-CEP, Exercise Physiologist;Debra Wynetta Emery, RN, BSN    Virtual Visit No    Medication changes reported     No    Fall or balance concerns reported    Yes    Comments She frequently loses her balance but does not fall due to her knees and hips giving out.    Tobacco Cessation No Change    Warm-up and Cool-down Performed as group-led instruction    Resistance Training Performed Yes    VAD Patient? No    PAD/SET Patient? No      Pain Assessment   Currently in Pain? No/denies    Multiple Pain Sites No             Capillary Blood Glucose: No results found for this or any previous visit (from the past 24 hour(s)).    Social History   Tobacco Use  Smoking Status Former   Packs/day: 1.00   Types: Cigarettes  Smokeless Tobacco Never  Tobacco Comments   cut back to 1 pack a day. Smokig x 7yr. Did smoke 2 packs x 5 yrs, but cut back a month ago.     Goals Met:  Independence with exercise equipment Exercise tolerated well No report of concerns or symptoms today Strength training completed today  Goals Unmet:  Not Applicable  Comments: checkout time is 1200   Dr. JCarlyle Dollyis Medical Director for [facility]

## 2021-09-13 ENCOUNTER — Encounter (HOSPITAL_COMMUNITY)
Admission: RE | Admit: 2021-09-13 | Discharge: 2021-09-13 | Disposition: A | Discharge status: Home / Self Care | Payer: Medicare Other | Source: Ambulatory Visit | Attending: Cardiology | Admitting: Cardiology

## 2021-09-13 DIAGNOSIS — Z951 Presence of aortocoronary bypass graft: Secondary | ICD-10-CM | POA: Diagnosis not present

## 2021-09-13 NOTE — Progress Notes (Signed)
Daily Session Note  Patient Details  Name: Lauren David MRN: [id number] Date of Birth: [birth year] Referring Provider:   Flowsheet Row CARDIAC REHAB PHASE II ORIENTATION from 08/09/2021 in [place]  Referring Provider Dr. Kipp Brood       Encounter Date: 09/13/2021  Check In:  Session Check In - 09/13/21 1103       Check-In   Supervising physician immediately available to respond to emergencies [facility] immediately available    Physician(s) Dr Marlou Porch    [facility]    Staff Present Aundra Dubin, RN, Madlyn Frankel, RN, Bjorn Loser, MS, ACSM-CEP, Exercise Physiologist;Heather Zigmund Tayvon Culley, Exercise Physiologist    Virtual Visit No    Medication changes reported     No    Fall or balance concerns reported    No    Comments She frequently loses her balance but does not fall due to her knees and hips giving out.    Tobacco Cessation No Change    Warm-up and Cool-down Performed as group-led instruction    Resistance Training Performed Yes    VAD Patient? No    PAD/SET Patient? No      Pain Assessment   Currently in Pain? No/denies    Multiple Pain Sites No             Capillary Blood Glucose: No results found for this or any previous visit (from the past 24 hour(s)).    Social History   Tobacco Use  Smoking Status Former   Packs/day: 1.00   Types: Cigarettes  Smokeless Tobacco Never  Tobacco Comments   cut back to 1 pack a day. Smokig x 61yr. Did smoke 2 packs x 5 yrs, but cut back a month ago.     Goals Met:  Independence with exercise equipment Exercise tolerated well No report of concerns or symptoms today  Goals Unmet:  Not Applicable  Comments: checkout 1200   Dr. JCarlyle Dollyis Medical Director for [facility]

## 2021-09-16 ENCOUNTER — Encounter (HOSPITAL_COMMUNITY)
Admission: RE | Admit: 2021-09-16 | Discharge: 2021-09-16 | Disposition: A | Discharge status: Home / Self Care | Payer: Medicare Other | Source: Ambulatory Visit | Attending: Cardiology | Admitting: Cardiology

## 2021-09-16 VITALS — Wt 170.4 lb

## 2021-09-16 DIAGNOSIS — Z951 Presence of aortocoronary bypass graft: Secondary | ICD-10-CM

## 2021-09-16 NOTE — Progress Notes (Signed)
Daily Session Note  Patient Details  Name: Lauren David MRN: [id number] Date of Birth: [birth year] Referring Provider:   Flowsheet Row CARDIAC REHAB PHASE II ORIENTATION from 08/09/2021 in [place]  Referring Provider Dr. Kipp Brood       Encounter Date: 09/16/2021  Check In:  Session Check In - 09/16/21 1100       Check-In   Supervising physician immediately available to respond to emergencies [facility] immediately available    Physician(s) Dr Harl Bowie    [facility]    Staff Present Geanie Cooley, RN;Debra Wynetta Emery, RN, Madlyn Frankel, RN, Bjorn Loser, MS, ACSM-CEP, Exercise Physiologist;Heather Zigmund Cam Dauphin, Exercise Physiologist    Virtual Visit No    Medication changes reported     No    Fall or balance concerns reported    No    Comments She frequently loses her balance but does not fall due to her knees and hips giving out.    Tobacco Cessation No Change    Warm-up and Cool-down Performed as group-led instruction    Resistance Training Performed Yes    VAD Patient? No      Pain Assessment   Currently in Pain? No/denies    Multiple Pain Sites No             Capillary Blood Glucose: No results found for this or any previous visit (from the past 24 hour(s)).    Social History   Tobacco Use  Smoking Status Former   Packs/day: 1.00   Types: Cigarettes  Smokeless Tobacco Never  Tobacco Comments   cut back to 1 pack a day. Smokig x 64yr. Did smoke 2 packs x 5 yrs, but cut back a month ago.     Goals Met:  Independence with exercise equipment Exercise tolerated well No report of concerns or symptoms today  Goals Unmet:  Not Applicable  Comments: checkout 1200    Dr. JCarlyle Dollyis Medical Director for [facility]

## 2021-09-17 ENCOUNTER — Other Ambulatory Visit: Payer: Self-pay | Admitting: *Deleted

## 2021-09-17 ENCOUNTER — Other Ambulatory Visit: Payer: Self-pay

## 2021-09-17 ENCOUNTER — Ambulatory Visit (INDEPENDENT_AMBULATORY_CARE_PROVIDER_SITE_OTHER): Payer: Medicare Other | Admitting: Internal Medicine

## 2021-09-17 ENCOUNTER — Encounter: Payer: Self-pay | Admitting: Internal Medicine

## 2021-09-17 VITALS — BP 112/72 | HR 70 | Resp 18 | Ht 61.0 in | Wt 171.0 lb

## 2021-09-17 DIAGNOSIS — Z2821 Immunization not carried out because of patient refusal: Secondary | ICD-10-CM

## 2021-09-17 DIAGNOSIS — D509 Iron deficiency anemia, unspecified: Secondary | ICD-10-CM

## 2021-09-17 DIAGNOSIS — I10 Essential (primary) hypertension: Secondary | ICD-10-CM | POA: Diagnosis not present

## 2021-09-17 DIAGNOSIS — Z114 Encounter for screening for human immunodeficiency virus [HIV]: Secondary | ICD-10-CM

## 2021-09-17 DIAGNOSIS — Z1159 Encounter for screening for other viral diseases: Secondary | ICD-10-CM

## 2021-09-17 DIAGNOSIS — E1151 Type 2 diabetes mellitus with diabetic peripheral angiopathy without gangrene: Secondary | ICD-10-CM | POA: Diagnosis not present

## 2021-09-17 DIAGNOSIS — F17210 Nicotine dependence, cigarettes, uncomplicated: Secondary | ICD-10-CM | POA: Diagnosis not present

## 2021-09-17 DIAGNOSIS — Z951 Presence of aortocoronary bypass graft: Secondary | ICD-10-CM

## 2021-09-17 DIAGNOSIS — E119 Type 2 diabetes mellitus without complications: Secondary | ICD-10-CM

## 2021-09-17 DIAGNOSIS — J449 Chronic obstructive pulmonary disease, unspecified: Secondary | ICD-10-CM

## 2021-09-17 DIAGNOSIS — Z720 Tobacco use: Secondary | ICD-10-CM

## 2021-09-17 DIAGNOSIS — F5101 Primary insomnia: Secondary | ICD-10-CM

## 2021-09-17 MED ORDER — EMPAGLIFLOZIN 10 MG PO TABS: 10.0000 mg | ORAL_TABLET | Freq: Every day | ORAL | 2 refills | Status: DC

## 2021-09-17 MED ORDER — SITAGLIPTIN PHOSPHATE 100 MG PO TABS: 100.0000 mg | ORAL_TABLET | Freq: Every day | ORAL | 0 refills | Status: DC

## 2021-09-17 MED ORDER — ALBUTEROL SULFATE HFA 108 (90 BASE) MCG/ACT IN AERS: 2.0000 | INHALATION_SPRAY | RESPIRATORY_TRACT | 0 refills | Status: DC | PRN

## 2021-09-17 MED ORDER — VARENICLINE TARTRATE 1 MG PO TABS: 1.0000 mg | ORAL_TABLET | Freq: Two times a day (BID) | ORAL | 2 refills | Status: DC

## 2021-09-17 MED ORDER — GLIPIZIDE 5 MG PO TABS: 5.0000 mg | ORAL_TABLET | Freq: Two times a day (BID) | ORAL | 3 refills | Status: DC

## 2021-09-17 MED ORDER — TRAZODONE HCL 50 MG PO TABS
25.0000 mg | ORAL_TABLET | Freq: Every evening | ORAL | 3 refills | Status: DC | PRN
Start: 2021-09-17 — End: 2022-01-27

## 2021-09-17 NOTE — Assessment & Plan Note (Signed)
Started Trazodone for now She asked for Elavil, but considering her cardiac condition, would avoid Elavil for now as it has QT prolonging action

## 2021-09-17 NOTE — Assessment & Plan Note (Addendum)
Lab Results  Component Value Date   HGBA1C 10.3 (H) 05/17/2021   Uncontrolled due to noncompliance, has neuropathy - will add Gabapentin later once her cardiac condition is stable Does not tolerate Metformin, has severe diarrhea On Januvia 100 mg QD and Glipizide 5 mg BID, had added Jardiance 10 mg daily for added cardiac and renal benefit - agrees to start it now Advised to follow diabetic diet F/u CMP and lipid panel Diabetic eye exam: Advised to follow up with Ophthalmology for diabetic eye exam

## 2021-09-17 NOTE — Assessment & Plan Note (Signed)
Well-controlled with Incruse and PRN Albuterol Has been trying to quit smoking, refilled Chantix

## 2021-09-17 NOTE — Assessment & Plan Note (Signed)
Smokes intermittently now  Asked about quitting: confirms that she currently smokes cigarettes Advise to quit smoking: Educated about QUITTING to reduce the risk of cancer, cardio and cerebrovascular disease. Assess willingness: Unwilling to quit at this time, but is working on cutting back. Assist with counseling and pharmacotherapy: Counseled for 5 minutes and literature provided. Chantix prescribed. Arrange for follow up: Follow up in 3 months and continue to offer help.

## 2021-09-17 NOTE — Assessment & Plan Note (Signed)
On aspirin and statin Followed by thoracic surgery and cardiology No chest pain or dyspnea currently

## 2021-09-17 NOTE — Assessment & Plan Note (Signed)
BP Readings from Last 1 Encounters:  09/17/21 112/72   Well-controlled with amlodipine, metoprolol and Lasix Counseled for compliance with the medications Advised DASH diet and walking as tolerated

## 2021-09-17 NOTE — Patient Instructions (Signed)
Please start taking Jardiance for DM. Please start taking Trazodone for insomnia.  Please continue to take medications as prescribed.  Please maintain simple sleep hygiene. - Maintain dark and non-noisy environment in the bedroom. - Please use the bedroom for sleep and sexual activity only. - Do not use electronic devices in the bedroom. - Please take dinner at least 2 hours before bedtime. - Please avoid caffeinated products in the evening, including coffee, soft drinks. - Please try to maintain the regular sleep-wake cycle - Go to bed and wake up at the same time.

## 2021-09-17 NOTE — Progress Notes (Signed)
Established Patient Office Visit  Subjective:  Patient ID: Lauren David, female    DOB: February 04, 1961  Age: 61 y.o. MRN: [id number]  CC:  Chief Complaint  Patient presents with   Follow-up    3 month follow up pt is still having trouble sleeping     HPI Lauren David is a 61 y.o. female with past medical history of CAD s/p CABG, HTN, COPD, type 2 DM, Osteopenia, chronic left hip pain and tobacco abuse who presents for f/u of her chronic medical conditions.  CAD: She is still getting cardiac rehab. She denies any chest pain or dyspnea currently. She is taking Aspirin and statin currently.  HTN: BP is well-controlled. Takes medications regularly. Patient denies headache, dizziness, chest pain, dyspnea or palpitations.  Type 2 DM: She has been taking Glipizide and Januvia, but she did not start Jardiance as she was concerned about vaginitis. She states that her blood glucose ranges around 150-180. Denies any episode of hypoglycemia. She denies any dysuria or hematuria.  She c/o insomnia, which is chronic. It affects her ability to participate in cardiac rehab. She denies any anhedonia, change in appetite or weight. She was on Ambien in the past.  She has cut down smoking and does not smoke everyday now. She had run out of Chantix, and requests a refill of it.  Past Medical History:  Diagnosis Date   Anxiety    Asthma    CAD (coronary artery disease)    Multivessel disease 05/2018   Chronic back pain    COPD (chronic obstructive pulmonary disease) (HCC)    Depression    Dyspnea    Essential hypertension    Headache    Hyperlipidemia    Myocardial infarction (HCC)    Pneumonia    PONV (postoperative nausea and vomiting)    Type 2 diabetes mellitus ([place])     Past Surgical History:  Procedure Laterality Date   BREAST BIOPSY Left    COLONOSCOPY N/A 04/26/2018   Procedure: COLONOSCOPY;  Surgeon: Rogene Houston, MD;  [facility];  Service: Endoscopy;  Laterality:  N/A;  1:15   CORONARY ARTERY BYPASS GRAFT N/A 05/21/2021   Procedure: CORONARY ARTERY BYPASS GRAFTING (CABG) X4 ON PUMP USING LEFT INTERNAL MAMMARY ARTERY AND ENDOSCOPICALLY HARVESTED RIGHT GREATER SAPHENOUS VEIN;  Surgeon: Lajuana Matte, MD;  Location: [facility];  Service: Open Heart Surgery;  Laterality: N/A;   ENDOVEIN HARVEST OF GREATER SAPHENOUS VEIN Right 05/21/2021   Procedure: ENDOVEIN HARVEST OF GREATER SAPHENOUS VEIN;  Surgeon: Lajuana Matte, MD;  Location: [facility];  Service: Open Heart Surgery;  Laterality: Right;   INTRAVASCULAR PRESSURE WIRE/FFR STUDY N/A 05/12/2018   Procedure: INTRAVASCULAR PRESSURE WIRE/FFR STUDY;  Surgeon: Nelva Bush, MD;  Location: [facility];  Service: Cardiovascular;  Laterality: N/A;   INTRAVASCULAR PRESSURE WIRE/FFR STUDY N/A 04/22/2021   Procedure: INTRAVASCULAR PRESSURE WIRE/FFR STUDY;  Surgeon: Nelva Bush, MD;  Location: [facility];  Service: Cardiovascular;  Laterality: N/A;   LEFT HEART CATH AND CORONARY ANGIOGRAPHY N/A 05/12/2018   Procedure: LEFT HEART CATH AND CORONARY ANGIOGRAPHY;  Surgeon: Nelva Bush, MD;  Location: [facility];  Service: Cardiovascular;  Laterality: N/A;   LEFT HEART CATH AND CORONARY ANGIOGRAPHY N/A 04/22/2021   Procedure: LEFT HEART CATH AND CORONARY ANGIOGRAPHY;  Surgeon: Nelva Bush, MD;  Location: [facility];  Service: Cardiovascular;  Laterality: N/A;   POLYPECTOMY  04/26/2018   Procedure: POLYPECTOMY;  Surgeon: Laural Golden,  Mechele Dawley, MD;  [facility];  Service: Endoscopy;;  colon    RADIAL ARTERY HARVEST Left 05/21/2021   Procedure: RADIAL ARTERY HARVEST;  Surgeon: Lajuana Matte, MD;  Location: [facility];  Service: Open Heart Surgery;  Laterality: Left;   TEE WITHOUT CARDIOVERSION N/A 05/21/2021   Procedure: TRANSESOPHAGEAL ECHOCARDIOGRAM (TEE);  Surgeon: Lajuana Matte, MD;  Location: [facility];  Service: Open Heart Surgery;  Laterality: N/A;   TOTAL  VAGINAL HYSTERECTOMY      Family History  Problem Relation Age of Onset   Stroke Father    Hypertension Father    Breast cancer Paternal Aunt    Breast cancer Paternal Aunt    Breast cancer Paternal Grandmother     Social History   Socioeconomic History   Marital status: Married    Spouse name: Not on file   Number of children: Not on file   Years of education: Not on file   Highest education level: Not on file  Occupational History   Not on file  Tobacco Use   Smoking status: Former    Packs/day: 1.00    Types: Cigarettes   Smokeless tobacco: Never   Tobacco comments:    cut back to 1 pack a day. Smokig x 54yr. Did smoke 2 packs x 5 yrs, but cut back a month ago.   Vaping Use   Vaping Use: Never used  Substance and Sexual Activity   Alcohol use: Never   Drug use: Never   Sexual activity: Not on file  Other Topics Concern   Not on file  Social History Narrative   Not on file   Social Determinants of Health   Financial Resource Strain: Low Risk    Difficulty of Paying Living Expenses: Not hard at all  Food Insecurity: No Food Insecurity   Worried About RCharity fundraiserin the Last Year: Never true   RMillsin the Last Year: Never true  Transportation Needs: No Transportation Needs   Lack of Transportation (Medical): No   Lack of Transportation (Non-Medical): No  Physical Activity: Insufficiently Active   Days of Exercise per Week: 3 days   Minutes of Exercise per Session: 30 min  Stress: No Stress Concern Present   Feeling of Stress : Only a little  Social Connections: Moderately Isolated   Frequency of Communication with Friends and Family: More than three times a week   Frequency of Social Gatherings with Friends and Family: More than three times a week   Attends Religious Services: More than 4 times per year   Active Member of [employer] Organizations: No   Attends CArchivistMeetings: Never   Marital Status: Separated  Intimate  Partner Violence: Not At Risk   Fear of Current or Ex-Partner: No   Emotionally Abused: No   Physically Abused: No   Sexually Abused: No    Outpatient Medications Prior to Visit  Medication Sig Dispense Refill   acetaminophen (TYLENOL) 500 MG tablet Take 1,000 mg by mouth every 6 (six) hours as needed for moderate pain or headache.     albuterol (PROVENTIL) (2.5 MG/3ML) 0.083% nebulizer solution ONE VIAL BY NEBULIZATION EVERY 6 HOURS AS NEEDED FOR WHEEZING OR SHORTNESS OF BREATH. (Patient taking differently: Take 2.5 mg by nebulization every 6 (six) hours as needed for wheezing.) 180 mL 0   albuterol (VENTOLIN HFA) 108 (90 Base) MCG/ACT inhaler Inhale 2 puffs into the lungs every 4 (four) hours as  needed for wheezing or shortness of breath. 17 g 0   amLODipine (NORVASC) 2.5 MG tablet Take 1 tablet (2.5 mg total) by mouth daily. 30 tablet 5   aspirin 81 MG chewable tablet Chew 4 tablets (324 mg total) by mouth daily.     blood glucose meter kit and supplies Dispense based on patient and insurance preference. Use up to four times daily as directed. (FOR ICD-10 E10.9, E11.9). 1 each 0   dicyclomine (BENTYL) 10 MG capsule Take 1 capsule (10 mg total) by mouth 3 (three) times daily as needed. (Patient taking differently: Take 10 mg by mouth 3 (three) times daily as needed for spasms.) 60 capsule 1   empagliflozin (JARDIANCE) 10 MG TABS tablet Take 1 tablet (10 mg total) by mouth daily before breakfast. 30 tablet 2   furosemide (LASIX) 40 MG tablet Take 1 tablet (40 mg total) by mouth 2 (two) times daily. For one week then stop. 7 tablet 0   glipiZIDE (GLUCOTROL) 5 MG tablet Take 1 tablet (5 mg total) by mouth 2 (two) times daily before a meal. 60 tablet 3   metoprolol tartrate (LOPRESSOR) 25 MG tablet Take 0.5 tablets (12.5 mg total) by mouth 2 (two) times daily. 60 tablet 1   nicotine (NICODERM CQ - DOSED IN MG/24 HOURS) 14 mg/24hr patch Place 1 patch (14 mg total) onto the skin daily. 28 patch 1    rosuvastatin (CRESTOR) 20 MG tablet Take 1 tablet (20 mg total) by mouth daily. 30 tablet 6   sitaGLIPtin (JANUVIA) 100 MG tablet Take 1 tablet (100 mg total) by mouth daily. 30 tablet 0   traMADol (ULTRAM) 50 MG tablet Take 1 tablet (50 mg total) by mouth every 6 (six) hours as needed for moderate pain. 30 tablet 0   umeclidinium bromide (INCRUSE ELLIPTA) 62.5 MCG/INH AEPB Inhale 1 puff into the lungs daily. 30 each 2   varenicline (CHANTIX) 0.5 MG tablet Take 1 tablet (0.5 mg total) by mouth 2 (two) times daily. (Patient taking differently: Take 1 mg by mouth 2 (two) times daily.) 60 tablet 0   albuterol (VENTOLIN HFA) 108 (90 Base) MCG/ACT inhaler INHALE 2 PUFFS INTO THE LUNGS EVERY 4 HOURS AS NEEDED FOR WHEEZING OR SHORTNESS OF BREATH. (Patient taking differently: Inhale 1-2 puffs into the lungs every 4 (four) hours as needed for shortness of breath.) 17 g 0   empagliflozin (JARDIANCE) 10 MG TABS tablet Take 1 tablet (10 mg total) by mouth daily before breakfast. 30 tablet 2   glipiZIDE (GLUCOTROL) 5 MG tablet Take 1 tablet (5 mg total) by mouth 2 (two) times daily before a meal. 60 tablet 3   JANUVIA 100 MG tablet TAKE 1 TABLET ONCE DAILY. (Patient taking differently: Take 100 mg by mouth daily.) 30 tablet 0   No facility-administered medications prior to visit.    Allergies  Allergen Reactions   Percocet [Oxycodone-Acetaminophen] Rash    ROS Review of Systems  Constitutional:  Negative for chills and fever.  HENT:  Negative for congestion, sinus pressure, sinus pain and sore throat.   Eyes:  Negative for pain and discharge.  Respiratory:  Negative for cough, shortness of breath and wheezing.   Cardiovascular:  Negative for chest pain and palpitations.  Gastrointestinal:  Negative for abdominal pain, diarrhea, nausea and vomiting.  Endocrine: Negative for polydipsia and polyuria.  Genitourinary:  Negative for dysuria and hematuria.  Musculoskeletal:  Positive for arthralgias and back  pain. Negative for neck pain and neck stiffness.  Skin:  Negative for rash.  Neurological:  Negative for dizziness and weakness.  Psychiatric/Behavioral:  Positive for sleep disturbance. Negative for agitation and behavioral problems.      Objective:    Physical Exam Vitals reviewed.  Constitutional:      General: She is not in acute distress.    Appearance: She is not diaphoretic.  HENT:     Head: Normocephalic and atraumatic.     Nose: Nose normal.     Mouth/Throat:     Mouth: Mucous membranes are moist.  Eyes:     General: No scleral icterus.    Extraocular Movements: Extraocular movements intact.  Cardiovascular:     Rate and Rhythm: Normal rate and regular rhythm.     Pulses: Normal pulses.     Heart sounds: Normal heart sounds. No murmur heard. Pulmonary:     Breath sounds: No wheezing or rales.  Abdominal:     Palpations: Abdomen is soft.     Tenderness: There is no abdominal tenderness.  Musculoskeletal:     Cervical back: Neck supple. No tenderness.     Right lower leg: No edema.     Left lower leg: No edema.  Skin:    General: Skin is warm.     Comments: Small hematoma around right forearm from arterial puncture - resolved now Incision site over left forearm and chest - C/D/I  Neurological:     General: No focal deficit present.     Mental Status: She is alert and oriented to person, place, and time.  Psychiatric:        Mood and Affect: Mood normal.        Behavior: Behavior normal.    BP 112/72 (BP Location: Right Arm, Patient Position: Sitting, Cuff Size: Normal)    Pulse 70    Resp 18    Ht '5\' 1"'  (1.549 m)    Wt 171 lb 0.6 oz (77.6 kg)    SpO2 100%    BMI 32.32 kg/m  Wt Readings from Last 3 Encounters:  09/17/21 171 lb 0.6 oz (77.6 kg)  09/16/21 170 lb 6.7 oz (77.3 kg)  09/02/21 166 lb 14.2 oz (75.7 kg)    Lab Results  Component Value Date   TSH 1.380 01/24/2021   Lab Results  Component Value Date   WBC 15.6 (H) 05/24/2021   HGB 9.8 (L)  05/24/2021   HCT 30.0 (L) 05/24/2021   MCV 90.1 05/24/2021   PLT 168 05/24/2021   Lab Results  Component Value Date   NA 134 (L) 05/24/2021   K 4.2 05/24/2021   CO2 23 05/24/2021   GLUCOSE 203 (H) 05/24/2021   BUN 12 05/24/2021   CREATININE 0.58 05/24/2021   BILITOT 0.3 05/17/2021   ALKPHOS 78 05/17/2021   AST 20 05/17/2021   ALT 21 05/17/2021   PROT 6.7 05/17/2021   ALBUMIN 3.6 05/17/2021   CALCIUM 8.3 (L) 05/24/2021   ANIONGAP 9 05/24/2021   EGFR 100 01/24/2021   Lab Results  Component Value Date   CHOL 105 05/22/2021   Lab Results  Component Value Date   HDL 27 (L) 05/22/2021   Lab Results  Component Value Date   LDLCALC 64 05/22/2021   Lab Results  Component Value Date   TRIG 72 05/22/2021   Lab Results  Component Value Date   CHOLHDL 3.9 05/22/2021   Lab Results  Component Value Date   HGBA1C 10.3 (H) 05/17/2021      Assessment & Plan:  Problem List Items Addressed This Visit       Cardiovascular and Mediastinum   Primary hypertension    BP Readings from Last 1 Encounters:  09/17/21 112/72  Well-controlled with amlodipine, metoprolol and Lasix Counseled for compliance with the medications Advised DASH diet and walking as tolerated      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) - Primary    Lab Results  Component Value Date   HGBA1C 10.3 (H) 05/17/2021  Uncontrolled due to noncompliance, has neuropathy - will add Gabapentin later once her cardiac condition is stable Does not tolerate Metformin, has severe diarrhea On Januvia 100 mg QD and Glipizide 5 mg BID, had added Jardiance 10 mg daily for added cardiac and renal benefit - agrees to start it now Advised to follow diabetic diet F/u CMP and lipid panel Diabetic eye exam: Advised to follow up with Ophthalmology for diabetic eye exam      Relevant Orders   Microalbumin, urine   CMP14+EGFR   Hemoglobin A1c     Respiratory   Chronic obstructive pulmonary  disease ([place])    Well-controlled with Incruse and PRN Albuterol Has been trying to quit smoking, refilled Chantix      Relevant Medications   varenicline (CHANTIX CONTINUING MONTH PAK) 1 MG tablet     Other   Tobacco abuse    Smokes intermittently now  Asked about quitting: confirms that she currently smokes cigarettes Advise to quit smoking: Educated about QUITTING to reduce the risk of cancer, cardio and cerebrovascular disease. Assess willingness: Unwilling to quit at this time, but is working on cutting back. Assist with counseling and pharmacotherapy: Counseled for 5 minutes and literature provided. Chantix prescribed. Arrange for follow up: Follow up in 3 months and continue to offer help.      Relevant Medications   varenicline (CHANTIX CONTINUING MONTH PAK) 1 MG tablet   S/P CABG x 4    On aspirin and statin Followed by thoracic surgery and cardiology No chest pain or dyspnea currently      Relevant Orders   CBC with Differential/Platelet   Primary insomnia    Started Trazodone for now She asked for Elavil, but considering her cardiac condition, would avoid Elavil for now as it has QT prolonging action      Relevant Medications   traZODone (DESYREL) 50 MG tablet   Other Visit Diagnoses     Iron deficiency anemia, unspecified iron deficiency anemia type       Relevant Orders   CBC with Differential/Platelet   Encounter for screening for HIV       Relevant Orders   HIV antibody (with reflex)   Need for hepatitis C screening test       Relevant Orders   Hepatitis C Antibody   Refused influenza vaccine           Meds ordered this encounter  Medications   varenicline (CHANTIX CONTINUING MONTH PAK) 1 MG tablet    Sig: Take 1 tablet (1 mg total) by mouth 2 (two) times daily.    Dispense:  60 tablet    Refill:  2   traZODone (DESYREL) 50 MG tablet    Sig: Take 0.5-1 tablets (25-50 mg total) by mouth at bedtime as needed for sleep.    Dispense:  30 tablet     Refill:  3    Follow-up: Return in about 4 months (around 01/15/2022) for DM and HTN.    Lindell Spar, MD

## 2021-09-18 ENCOUNTER — Ambulatory Visit (INDEPENDENT_AMBULATORY_CARE_PROVIDER_SITE_OTHER): Payer: Medicare Other | Admitting: Cardiology

## 2021-09-18 ENCOUNTER — Encounter: Payer: Self-pay | Admitting: Cardiology

## 2021-09-18 ENCOUNTER — Encounter (HOSPITAL_COMMUNITY)
Admission: RE | Admit: 2021-09-18 | Discharge: 2021-09-18 | Disposition: A | Discharge status: Home / Self Care | Payer: Medicare Other | Source: Ambulatory Visit | Attending: Cardiology | Admitting: Cardiology

## 2021-09-18 VITALS — BP 115/62 | HR 65 | Ht 61.0 in | Wt 171.6 lb

## 2021-09-18 DIAGNOSIS — Z951 Presence of aortocoronary bypass graft: Secondary | ICD-10-CM

## 2021-09-18 DIAGNOSIS — I25119 Atherosclerotic heart disease of native coronary artery with unspecified angina pectoris: Secondary | ICD-10-CM

## 2021-09-18 DIAGNOSIS — E782 Mixed hyperlipidemia: Secondary | ICD-10-CM

## 2021-09-18 DIAGNOSIS — I10 Essential (primary) hypertension: Secondary | ICD-10-CM

## 2021-09-18 LAB — CMP14+EGFR
ALT: 14 IU/L (ref 0–32)
AST: 15 IU/L (ref 0–40)
Albumin/Globulin Ratio: 1.8 (ref 1.2–2.2)
Albumin: 4.1 g/dL (ref 3.8–4.8)
Alkaline Phosphatase: 79 IU/L (ref 44–121)
BUN/Creatinine Ratio: 13 (ref 12–28)
BUN: 9 mg/dL (ref 8–27)
Bilirubin Total: 0.2 mg/dL (ref 0.0–1.2)
CO2: 24 mmol/L (ref 20–29)
Calcium: 9.3 mg/dL (ref 8.7–10.3)
Chloride: 101 mmol/L (ref 96–106)
Creatinine, Ser: 0.67 mg/dL (ref 0.57–1.00)
Globulin, Total: 2.3 g/dL (ref 1.5–4.5)
Glucose: 222 mg/dL — ABNORMAL HIGH (ref 70–99)
Potassium: 4.3 mmol/L (ref 3.5–5.2)
Sodium: 138 mmol/L (ref 134–144)
Total Protein: 6.4 g/dL (ref 6.0–8.5)
eGFR: 99 mL/min/{1.73_m2} (ref >59)

## 2021-09-18 LAB — CBC WITH DIFFERENTIAL/PLATELET
Basophils Absolute: 0 10*3/uL (ref 0.0–0.2)
Basos: 0 %
EOS (ABSOLUTE): 0.4 10*3/uL (ref 0.0–0.4)
Eos: 4 %
Hematocrit: 42.9 % (ref 34.0–46.6)
Hemoglobin: 13.6 g/dL (ref 11.1–15.9)
Immature Grans (Abs): 0 10*3/uL (ref 0.0–0.1)
Immature Granulocytes: 0 %
Lymphocytes Absolute: 2.6 10*3/uL (ref 0.7–3.1)
Lymphs: 29 %
MCH: 26.4 pg — ABNORMAL LOW (ref 26.6–33.0)
MCHC: 31.7 g/dL (ref 31.5–35.7)
MCV: 83 fL (ref 79–97)
Monocytes Absolute: 0.4 10*3/uL (ref 0.1–0.9)
Monocytes: 5 %
Neutrophils Absolute: 5.6 10*3/uL (ref 1.4–7.0)
Neutrophils: 62 %
Platelets: 276 10*3/uL (ref 150–450)
RBC: 5.16 x10E6/uL (ref 3.77–5.28)
RDW: 13.6 % (ref 11.7–15.4)
WBC: 9.1 10*3/uL (ref 3.4–10.8)

## 2021-09-18 LAB — HEMOGLOBIN A1C
Est. average glucose Bld gHb Est-mCnc: 146 mg/dL
Hgb A1c MFr Bld: 6.7 % — ABNORMAL HIGH (ref 4.8–5.6)

## 2021-09-18 NOTE — Progress Notes (Signed)
Daily Session Note  Patient Details  Name: Lauren David MRN: [id number] Date of Birth: 09-14-1960 Referring Provider:   Flowsheet Row CARDIAC REHAB PHASE II ORIENTATION from 08/09/2021 in [place] [facility]  Referring Provider Dr. Kipp Brood       Encounter Date: 09/18/2021  Check In:  Session Check In - 09/18/21 1100       Check-In   Supervising physician immediately available to respond to emergencies [facility] MD immediately available    Physician(s) Dr Harl Bowie    Location [facility]    Staff Present Hoy Register, MS, ACSM-CEP, Exercise Physiologist;Heather Otho Ket, BS, Exercise Physiologist;Keniyah Gelinas Wynetta Emery, RN, BSN    Virtual Visit No    Medication changes reported     No    Fall or balance concerns reported    Yes    Comments She frequently loses her balance but does not fall due to her knees and hips giving out.    Tobacco Cessation No Change    Warm-up and Cool-down Performed as group-led instruction    Resistance Training Performed Yes    VAD Patient? No    PAD/SET Patient? No      Pain Assessment   Currently in Pain? No/denies    Pain Score 0-No pain    Multiple Pain Sites No             Capillary Blood Glucose: Results for orders placed or performed in visit on 09/17/21 (from the past 24 hour(s))  HIV antibody (with reflex)     Status: None   Collection Time: 09/17/21  1:59 PM  Result Value Ref Range   HIV Screen 4th Generation wRfx Non Reactive Non Reactive   Narrative   Performed at:  [address], [place], [place]  [id number] Lab Director: Rush Farmer MD, Phone:  [phone]  Hepatitis C Antibody     Status: None (Preliminary result)   Collection Time: 09/17/21  1:59 PM  Result Value Ref Range   Hep C Virus Ab WILL FOLLOW    Narrative   Performed at:  [facility] [address] [place], [place]  [id number] Lab Director: Rush Farmer MD, Phone:  [phone]  CMP14+EGFR      Status: Abnormal   Collection Time: 09/17/21  2:01 PM  Result Value Ref Range   Glucose 222 (H) 70 - 99 mg/dL   BUN 9 8 - 27 mg/dL   Creatinine, Ser 0.67 0.57 - 1.00 mg/dL   eGFR 99 >59 mL/min/1.73   BUN/Creatinine Ratio 13 12 - 28   Sodium 138 134 - 144 mmol/L   Potassium 4.3 3.5 - 5.2 mmol/L   Chloride 101 96 - 106 mmol/L   CO2 24 20 - 29 mmol/L   Calcium 9.3 8.7 - 10.3 mg/dL   Total Protein 6.4 6.0 - 8.5 g/dL   Albumin 4.1 3.8 - 4.8 g/dL   Globulin, Total 2.3 1.5 - 4.5 g/dL   Albumin/Globulin Ratio 1.8 1.2 - 2.2   Bilirubin Total 0.2 0.0 - 1.2 mg/dL   Alkaline Phosphatase 79 44 - 121 IU/L   AST 15 0 - 40 IU/L   ALT 14 0 - 32 IU/L   Narrative   Performed at:  [address], [place], [place]  [id number] Lab Director: Rush Farmer MD, Phone:  [phone]  Hemoglobin A1c     Status: Abnormal   Collection Time: 09/17/21  2:01 PM  Result Value Ref Range   Hgb  A1c MFr Bld 6.7 (H) 4.8 - 5.6 %   Est. average glucose Bld gHb Est-mCnc 146 mg/dL   Narrative   Performed at:  [facility] [address] [place], [place]  [id number] Lab Director: Rush Farmer MD, Phone:  [phone]  CBC with Differential/Platelet     Status: Abnormal   Collection Time: 09/17/21  2:01 PM  Result Value Ref Range   WBC 9.1 3.4 - 10.8 x10E3/uL   RBC 5.16 3.77 - 5.28 x10E6/uL   Hemoglobin 13.6 11.1 - 15.9 g/dL   Hematocrit 42.9 34.0 - 46.6 %   MCV 83 79 - 97 fL   MCH 26.4 (L) 26.6 - 33.0 pg   MCHC 31.7 31.5 - 35.7 g/dL   RDW 13.6 11.7 - 15.4 %   Platelets 276 150 - 450 x10E3/uL   Neutrophils 62 Not Estab. %   Lymphs 29 Not Estab. %   Monocytes 5 Not Estab. %   Eos 4 Not Estab. %   Basos 0 Not Estab. %   Neutrophils Absolute 5.6 1.4 - 7.0 x10E3/uL   Lymphocytes Absolute 2.6 0.7 - 3.1 x10E3/uL   Monocytes Absolute 0.4 0.1 - 0.9 x10E3/uL   EOS (ABSOLUTE) 0.4 0.0 - 0.4 x10E3/uL   Basophils Absolute 0.0 0.0 - 0.2 x10E3/uL   Immature Granulocytes 0 Not Estab. %    Immature Grans (Abs) 0.0 0.0 - 0.1 x10E3/uL   Narrative   Performed at:  [address] [place], [place]  [id number] Lab Director: Rush Farmer MD, Phone:  [phone]      Social History   Tobacco Use  Smoking Status Former   Packs/day: 1.00   Types: Cigarettes  Smokeless Tobacco Never  Tobacco Comments   cut back to 1 pack a day. Smokig x 64yr. Did smoke 2 packs x 5 yrs, but cut back a month ago.     Goals Met:  Independence with exercise equipment Exercise tolerated well No report of concerns or symptoms today Strength training completed today  Goals Unmet:  Not Applicable  Comments: Check out 1200.   Dr. JCarlyle Dollyis Medical Director for [facility]

## 2021-09-18 NOTE — Patient Instructions (Addendum)

## 2021-09-18 NOTE — Progress Notes (Signed)
Cardiology Office Note  Date: 09/18/2021   ID: Lauren, David [birth year], MRN [id number]  PCP:  Lauren Spar, MD  Cardiologist:  Lauren Lesches, MD Electrophysiologist:  None   Chief Complaint  Patient presents with   Cardiac follow-up    History of Present Illness: Lauren David is a 61 y.o. female last seen in November 2022 by Mr. Lauren Sake NP.  She is here for a follow-up visit.  Continues to do very well, she is halfway through cardiac rehabilitation.  Reports no angina and stable NYHA class II dyspnea.  She is status post CABG in October 2022 with LIMA to LAD, left radial to diagonal, SVG to PDA, and SVG to OM.  I reviewed her medications which are outlined below.  I recommended that she cut her aspirin back to 81 mg daily at this point.  Her last LDL was down to 64 on Crestor.  Blood pressure and heart rate are well controlled today.  I reviewed her recent lab work.  Past Medical History:  Diagnosis Date   Anxiety    Asthma    CAD (coronary artery disease)    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   Chronic back pain    COPD (chronic obstructive pulmonary disease) (HCC)    Depression    Essential hypertension    Headache    Hyperlipidemia    Myocardial infarction ([place])    Pneumonia    Type 2 diabetes mellitus ([place])     Past Surgical History:  Procedure Laterality Date   BREAST BIOPSY Left    COLONOSCOPY N/A 04/26/2018   Procedure: COLONOSCOPY;  Surgeon: Lauren Houston, MD;  [facility];  Service: Endoscopy;  Laterality: N/A;  1:15   CORONARY ARTERY BYPASS GRAFT N/A 05/21/2021   Procedure: CORONARY ARTERY BYPASS GRAFTING (CABG) X4 ON PUMP USING LEFT INTERNAL MAMMARY ARTERY AND ENDOSCOPICALLY HARVESTED RIGHT GREATER SAPHENOUS VEIN;  Surgeon: Lauren Matte, MD;  Location: [place] [facility];  Service: Open Heart Surgery;  Laterality: N/A;   ENDOVEIN HARVEST OF GREATER SAPHENOUS VEIN Right 05/21/2021   Procedure: ENDOVEIN HARVEST OF GREATER  SAPHENOUS VEIN;  Surgeon: Lauren Matte, MD;  Location: [place];  Service: Open Heart Surgery;  Laterality: Right;   INTRAVASCULAR PRESSURE WIRE/FFR STUDY N/A 05/12/2018   Procedure: INTRAVASCULAR PRESSURE WIRE/FFR STUDY;  Surgeon: Lauren Bush, MD;  Location: [facility];  Service: Cardiovascular;  Laterality: N/A;   INTRAVASCULAR PRESSURE WIRE/FFR STUDY N/A 04/22/2021   Procedure: INTRAVASCULAR PRESSURE WIRE/FFR STUDY;  Surgeon: Lauren Bush, MD;  Location: [facility];  Service: Cardiovascular;  Laterality: N/A;   LEFT HEART CATH AND CORONARY ANGIOGRAPHY N/A 05/12/2018   Procedure: LEFT HEART CATH AND CORONARY ANGIOGRAPHY;  Surgeon: Lauren Bush, MD;  Location: [facility];  Service: Cardiovascular;  Laterality: N/A;   LEFT HEART CATH AND CORONARY ANGIOGRAPHY N/A 04/22/2021   Procedure: LEFT HEART CATH AND CORONARY ANGIOGRAPHY;  Surgeon: Lauren Bush, MD;  Location: [facility];  Service: Cardiovascular;  Laterality: N/A;   POLYPECTOMY  04/26/2018   Procedure: POLYPECTOMY;  Surgeon: Lauren Houston, MD;  [facility];  Service: Endoscopy;;  colon    RADIAL ARTERY HARVEST Left 05/21/2021   Procedure: RADIAL ARTERY HARVEST;  Surgeon: Lauren Matte, MD;  Location: [facility];  Service: Open Heart Surgery;  Laterality: Left;   TEE WITHOUT CARDIOVERSION N/A 05/21/2021   Procedure: TRANSESOPHAGEAL ECHOCARDIOGRAM (TEE);  Surgeon: Lauren Matte, MD;  [facility];  Service: Open Heart Surgery;  Laterality: N/A;   TOTAL VAGINAL HYSTERECTOMY      Current Outpatient Medications  Medication Sig Dispense Refill   acetaminophen (TYLENOL) 500 MG tablet Take 1,000 mg by mouth every 6 (six) hours as needed for moderate pain or headache.     albuterol (PROVENTIL) (2.5 MG/3ML) 0.083% nebulizer solution ONE VIAL BY NEBULIZATION EVERY 6 HOURS AS NEEDED FOR WHEEZING OR SHORTNESS OF BREATH. (Patient taking differently: Take 2.5 mg by nebulization  every 6 (six) hours as needed for wheezing.) 180 mL 0   albuterol (VENTOLIN HFA) 108 (90 Base) MCG/ACT inhaler Inhale 2 puffs into the lungs every 4 (four) hours as needed for wheezing or shortness of breath. 17 g 0   amLODipine (NORVASC) 2.5 MG tablet Take 1 tablet (2.5 mg total) by mouth daily. 30 tablet 5   aspirin 81 MG chewable tablet Chew 4 tablets (324 mg total) by mouth daily.     blood glucose meter kit and supplies Dispense based on patient and insurance preference. Use up to four times daily as directed. (FOR ICD-10 E10.9, E11.9). 1 each 0   dicyclomine (BENTYL) 10 MG capsule Take 1 capsule (10 mg total) by mouth 3 (three) times daily as needed. (Patient taking differently: Take 10 mg by mouth 3 (three) times daily as needed for spasms.) 60 capsule 1   empagliflozin (JARDIANCE) 10 MG TABS tablet Take 1 tablet (10 mg total) by mouth daily before breakfast. 30 tablet 2   glipiZIDE (GLUCOTROL) 5 MG tablet Take 1 tablet (5 mg total) by mouth 2 (two) times daily before a meal. 60 tablet 3   metoprolol tartrate (LOPRESSOR) 25 MG tablet Take 0.5 tablets (12.5 mg total) by mouth 2 (two) times daily. 60 tablet 1   nicotine (NICODERM CQ - DOSED IN MG/24 HOURS) 14 mg/24hr patch Place 1 patch (14 mg total) onto the skin daily. 28 patch 1   rosuvastatin (CRESTOR) 20 MG tablet Take 1 tablet (20 mg total) by mouth daily. 30 tablet 6   sitaGLIPtin (JANUVIA) 100 MG tablet Take 1 tablet (100 mg total) by mouth daily. 30 tablet 0   traZODone (DESYREL) 50 MG tablet Take 0.5-1 tablets (25-50 mg total) by mouth at bedtime as needed for sleep. 30 tablet 3   umeclidinium bromide (INCRUSE ELLIPTA) 62.5 MCG/INH AEPB Inhale 1 puff into the lungs daily. 30 each 2   varenicline (CHANTIX CONTINUING MONTH PAK) 1 MG tablet Take 1 tablet (1 mg total) by mouth 2 (two) times daily. 60 tablet 2   No current facility-administered medications for this visit.   Allergies:  Percocet [oxycodone-acetaminophen]   ROS: No  palpitations or syncope.  Physical Exam: VS:  BP 115/62    Pulse 65    Ht _0  (1.549 m)    Wt 171 lb 9.6 oz (77.8 kg)    SpO2 97%    BMI 32.42 kg/m , BMI Body mass index is 32.42 kg/m.  Wt Readings from Last 3 Encounters:  09/18/21 171 lb 9.6 oz (77.8 kg)  09/17/21 171 lb 0.6 oz (77.6 kg)  09/16/21 170 lb 6.7 oz (77.3 kg)    General: Patient appears comfortable at rest. HEENT: Conjunctiva and lids normal, wearing a mask. Neck: Supple, no elevated JVP or carotid bruits, no thyromegaly. Lungs: Clear to auscultation, nonlabored breathing at rest. Cardiac: Regular rate and rhythm, no S3 or significant systolic murmur. Extremities: No pitting edema.  ECG:  An ECG dated 07/10/2021 was personally  reviewed today and demonstrated:  Sinus rhythm with nonspecific T wave changes.  Recent Labwork: 01/24/2021: TSH 1.380 05/22/2021: Magnesium 2.2 09/17/2021: ALT 14; AST 15; BUN 9; Creatinine, Ser 0.67; Hemoglobin 13.6; Platelets 276; Potassium 4.3; Sodium 138     Component Value Date/Time   CHOL 105 05/22/2021 0308   CHOL 251 (H) 01/24/2021 0937   TRIG 72 05/22/2021 0308   HDL 27 (L) 05/22/2021 0308   HDL 48 01/24/2021 0937   CHOLHDL 3.9 05/22/2021 0308   VLDL 14 05/22/2021 0308   LDLCALC 64 05/22/2021 0308   LDLCALC 179 (H) 01/24/2021 0937    Other Studies Reviewed Today:  Cardiac catheterization 04/22/2021: Conclusion: Significant three-vessel CAD that has progressed since 2019, inlcluding long segment of mid LAD disease of up to 75% (RFR = 0.83), 60% proximal LCx stenosis followed by sequential 30% and 90% OM1 lesions, and 70% mid RCA disease. Normal left ventricular systolic function and filling pressure.   Recommendations: Outpatient cardiac surgery consultation for CABG. Aggressive secondary prevention. Obtain TTE prior to cardiac surgery consultation.  Echocardiogram 04/25/2021:  1. Left ventricular ejection fraction, by estimation, is 60 to 65%. The  left ventricle has  normal function. The left ventricle has no regional  wall motion abnormalities. Left ventricular diastolic parameters are  consistent with Grade I diastolic  dysfunction (impaired relaxation).   2. Right ventricular systolic function is normal. The right ventricular  size is normal.   3. The mitral valve is normal in structure. No evidence of mitral valve  regurgitation. No evidence of mitral stenosis.   4. The aortic valve is tricuspid. Aortic valve regurgitation is not  visualized. No aortic stenosis is present.   5. The inferior vena cava is normal in size with greater than 50%  respiratory variability, suggesting right atrial pressure of 3 mmHg.   Carotid and PV Dopplers 05/17/2021: Summary:  Right Carotid: Velocities in the right ICA are consistent with a 40-59%                 stenosis.   Left Carotid: Velocities in the left ICA are consistent with a 1-39%  stenosis.  Vertebrals: Bilateral vertebral arteries demonstrate antegrade flow.   Right ABI: Resting right ankle-brachial index is within normal range. No  evidence of significant right lower extremity arterial disease.  Left ABI: Resting left ankle-brachial index is within normal range. No  evidence of significant left lower extremity arterial disease.  Right Upper Extremity: Doppler waveforms remain within normal limits with  right radial compression. Doppler waveforms remain within normal limits  with right ulnar compression.  Left Upper Extremity: Doppler waveforms remain within normal limits with  left radial compression. Doppler waveforms remain within normal limits  with left ulnar compression.  Assessment and Plan:  1.  Multivessel CAD status post CABG in October 2022, LVEF 60 to 65%.  She is doing very well, continues in cardiac rehabilitation and plans to continue with regular exercise thereafter.  Decrease aspirin to 81 mg daily.  Continue Lopressor, Norvasc, Jardiance, and Crestor.  2.  Mixed hyperlipidemia,  LDL down to 64 as of October 2022.  Continue current dose of Crestor.  Anticipate follow-up FLP later this year.  3.  Essential hypertension, blood pressure is well controlled today.  Medication Adjustments/Labs and Tests Ordered: Current medicines are reviewed at length with the patient today.  Concerns regarding medicines are outlined above.   Tests Ordered: No orders of the defined types were placed in this encounter.   Medication Changes:  No orders of the defined types were placed in this encounter.   Disposition:  Follow up  6 months.  Signed, Satira Sark, MD, [facility] 09/18/2021 4:29 PM    [facility], [place], [place] [address] Phone: [phone]; Fax: [phone]

## 2021-09-19 LAB — HEPATITIS C ANTIBODY: Hep C Virus Ab: NONREACTIVE

## 2021-09-19 LAB — HIV ANTIBODY (ROUTINE TESTING W REFLEX): HIV Screen 4th Generation wRfx: NONREACTIVE

## 2021-09-19 LAB — MICROALBUMIN, URINE: Microalbumin, Urine: 32.4 ug/mL

## 2021-09-20 ENCOUNTER — Encounter (HOSPITAL_COMMUNITY)
Admission: RE | Admit: 2021-09-20 | Discharge: 2021-09-20 | Disposition: A | Discharge status: Home / Self Care | Payer: Medicare Other | Source: Ambulatory Visit | Attending: Cardiology | Admitting: Cardiology

## 2021-09-20 DIAGNOSIS — Z951 Presence of aortocoronary bypass graft: Secondary | ICD-10-CM | POA: Diagnosis not present

## 2021-09-20 NOTE — Progress Notes (Signed)
Daily Session Note  Patient Details  Name: Lauren David MRN: [id number] Date of Birth: 08-03-1961 Referring Provider:   Flowsheet Row CARDIAC REHAB PHASE II ORIENTATION from 08/09/2021 in [facility]  Referring Provider Dr. Kipp Brood       Encounter Date: 09/20/2021  Check In:  Session Check In - 09/20/21 1059       Check-In   Supervising physician immediately available to respond to emergencies [facility] MD immediately available    Physician(s) Dr. Harrington Challenger    Location [facility]    Staff Present Geanie Cooley, RN;Dalton Fletcher, MS, ACSM-CEP, Exercise Physiologist;Heather Zigmund Daniel, Exercise Physiologist    Virtual Visit No    Medication changes reported     No    Fall or balance concerns reported    Yes    Comments She frequently loses her balance but does not fall due to her knees and hips giving out.    Tobacco Cessation No Change    Warm-up and Cool-down Performed as group-led instruction    Resistance Training Performed Yes    VAD Patient? No    PAD/SET Patient? No      Pain Assessment   Currently in Pain? No/denies    Pain Score 0-No pain    Multiple Pain Sites No             Capillary Blood Glucose: No results found for this or any previous visit (from the past 24 hour(s)).    Social History   Tobacco Use  Smoking Status Former   Packs/day: 1.00   Types: Cigarettes  Smokeless Tobacco Never  Tobacco Comments   cut back to 1 pack a day. Smokig x 31yr. Did smoke 2 packs x 5 yrs, but cut back a month ago.     Goals Met:  Independence with exercise equipment Exercise tolerated well No report of concerns or symptoms today Strength training completed today  Goals Unmet:  Not Applicable  Comments: check out @ 12:00pm   Dr. JCarlyle Dollyis Medical Director for [facility]

## 2021-09-23 ENCOUNTER — Encounter (HOSPITAL_COMMUNITY)
Admission: RE | Admit: 2021-09-23 | Discharge: 2021-09-23 | Disposition: A | Discharge status: Home / Self Care | Payer: Medicare Other | Source: Ambulatory Visit | Attending: Cardiology | Admitting: Cardiology

## 2021-09-23 DIAGNOSIS — Z951 Presence of aortocoronary bypass graft: Secondary | ICD-10-CM | POA: Diagnosis not present

## 2021-09-23 NOTE — Progress Notes (Signed)
Daily Session Note  Patient Details  Name: Lauren David MRN: [id number] Date of Birth: 12-Sep-1960 Referring Provider:   Flowsheet Row CARDIAC REHAB PHASE II ORIENTATION from 08/09/2021 in [facility]  Referring Provider Dr. Kipp Brood       Encounter Date: 09/23/2021  Check In:  Session Check In - 09/23/21 1100       Check-In   Supervising physician immediately available to respond to emergencies [facility] MD immediately available    Physician(s) Dr. Harl Bowie    [facility]    Staff Present Hoy Register, MS, ACSM-CEP, Exercise Physiologist;Heather Otho Ket, BS, Exercise Physiologist;Caidence Higashi Wynetta Emery, RN, BSN    Virtual Visit No    Medication changes reported     No    Fall or balance concerns reported    Yes    Comments She frequently loses her balance but does not fall due to her knees and hips giving out.    Tobacco Cessation No Change    Warm-up and Cool-down Performed as group-led instruction    Resistance Training Performed Yes    VAD Patient? No    PAD/SET Patient? No      Pain Assessment   Currently in Pain? No/denies    Pain Score 0-No pain    Multiple Pain Sites No             Capillary Blood Glucose: No results found for this or any previous visit (from the past 24 hour(s)).    Social History   Tobacco Use  Smoking Status Former   Packs/day: 1.00   Types: Cigarettes  Smokeless Tobacco Never  Tobacco Comments   cut back to 1 pack a day. Smokig x 57yr. Did smoke 2 packs x 5 yrs, but cut back a month ago.     Goals Met:  Independence with exercise equipment Exercise tolerated well No report of concerns or symptoms today Strength training completed today  Goals Unmet:  Not Applicable  Comments: Check out 1200.   Dr. JCarlyle Dollyis [facility]

## 2021-09-25 ENCOUNTER — Encounter (HOSPITAL_COMMUNITY)
Admission: RE | Admit: 2021-09-25 | Discharge: 2021-09-25 | Disposition: A | Discharge status: Home / Self Care | Payer: Medicare Other | Source: Ambulatory Visit | Attending: Cardiology | Admitting: Cardiology

## 2021-09-25 DIAGNOSIS — Z951 Presence of aortocoronary bypass graft: Secondary | ICD-10-CM

## 2021-09-25 NOTE — Progress Notes (Signed)
Daily Session Note  Patient Details  Name: Lauren David MRN: [id number] Date of Birth: 1960/08/15 Referring Provider:   Flowsheet Row CARDIAC REHAB PHASE II ORIENTATION from 08/09/2021 in [facility]  Referring Provider Dr. Kipp Brood       Encounter Date: 09/25/2021  Check In:  Session Check In - 09/25/21 1100       Check-In   Supervising physician immediately available to respond to emergencies [facility] MD immediately available    Physician(s) Dr. Domenic Polite    [facility]    Staff Present Hoy Register, MS, ACSM-CEP, Exercise Physiologist;Heather Otho Ket, BS, Exercise Physiologist;Debra Wynetta Emery, RN, BSN    Virtual Visit No    Medication changes reported     No    Fall or balance concerns reported    Yes    Comments She frequently loses her balance but does not fall due to her knees and hips giving out.    Tobacco Cessation No Change    Warm-up and Cool-down Performed as group-led instruction    Resistance Training Performed Yes    VAD Patient? No    PAD/SET Patient? No      Pain Assessment   Currently in Pain? No/denies    Pain Score 0-No pain    Multiple Pain Sites No             Capillary Blood Glucose: No results found for this or any previous visit (from the past 24 hour(s)).    Social History   Tobacco Use  Smoking Status Former   Packs/day: 1.00   Types: Cigarettes  Smokeless Tobacco Never  Tobacco Comments   cut back to 1 pack a day. Smokig x 43yr. Did smoke 2 packs x 5 yrs, but cut back a month ago.     Goals Met:  Independence with exercise equipment Exercise tolerated well No report of concerns or symptoms today Strength training completed today  Goals Unmet:  Not Applicable  Comments: checkout time is 1200   Dr. JCarlyle Dollyis Medical Director for [facility]

## 2021-09-27 ENCOUNTER — Encounter (HOSPITAL_COMMUNITY)
Admission: RE | Admit: 2021-09-27 | Discharge: 2021-09-27 | Disposition: A | Discharge status: Home / Self Care | Payer: Medicare Other | Source: Ambulatory Visit | Attending: Cardiology | Admitting: Cardiology

## 2021-09-27 DIAGNOSIS — Z951 Presence of aortocoronary bypass graft: Secondary | ICD-10-CM | POA: Diagnosis not present

## 2021-09-27 NOTE — Progress Notes (Signed)
Daily Session Note  Patient Details  Name: Lauren David MRN: [id number] Date of Birth: 11/26/1960 Referring Provider:   Flowsheet Row CARDIAC REHAB PHASE II ORIENTATION from 08/09/2021 in [facility]  Referring Provider Dr. Kipp Brood       Encounter Date: 09/27/2021  Check In:  Session Check In - 09/27/21 1100       Check-In   Supervising physician immediately available to respond to emergencies [facility] MD immediately available    Physician(s) Dr. Domenic Polite    [facility]    Staff Present Redge Gainer, BS, Exercise Physiologist;Jobeth Pangilinan Wynetta Emery, RN, BSN;Other   Benay Pillow   Virtual Visit No    Medication changes reported     No    Fall or balance concerns reported    Yes    Comments She frequently loses her balance but does not fall due to her knees and hips giving out.    Tobacco Cessation No Change    Warm-up and Cool-down Performed as group-led instruction    Resistance Training Performed Yes    VAD Patient? No    PAD/SET Patient? No      Pain Assessment   Currently in Pain? No/denies    Pain Score 0-No pain    Multiple Pain Sites No             Capillary Blood Glucose: No results found for this or any previous visit (from the past 24 hour(s)).    Social History   Tobacco Use  Smoking Status Former   Packs/day: 1.00   Types: Cigarettes  Smokeless Tobacco Never  Tobacco Comments   cut back to 1 pack a day. Smokig x 57yr. Did smoke 2 packs x 5 yrs, but cut back a month ago.     Goals Met:  Independence with exercise equipment Exercise tolerated well No report of concerns or symptoms today Strength training completed today  Goals Unmet:  Not Applicable  Comments: Check out 1200.   Dr. JCarlyle Dollyis Medical Director [facility]

## 2021-09-30 ENCOUNTER — Encounter (HOSPITAL_COMMUNITY)
Admission: RE | Admit: 2021-09-30 | Discharge: 2021-09-30 | Disposition: A | Discharge status: Home / Self Care | Payer: Medicare Other | Source: Ambulatory Visit | Attending: Cardiology | Admitting: Cardiology

## 2021-09-30 VITALS — Wt 169.5 lb

## 2021-09-30 DIAGNOSIS — Z951 Presence of aortocoronary bypass graft: Secondary | ICD-10-CM

## 2021-09-30 NOTE — Progress Notes (Signed)
Daily Session Note ° °Patient Details  °Name: Lauren David °MRN: [id number] °Date of Birth: 01/04/1961 °Referring Provider:   °Flowsheet Row CARDIAC REHAB PHASE II ORIENTATION from 08/09/2021 in [facility]  °Referring Provider Dr. Lightfoot  ° °  ° ° °Encounter Date: 09/30/2021 ° °Check In: ° Session Check In - 09/30/21 1100   ° °  ° Check-In  ° Supervising physician immediately available to respond to emergencies [facility] MD immediately available   ° Physician(s) Dr Branch   ° Location [facility]   ° Staff Present Danny Gleber, RN, BSN;Dalton Fletcher, MS, ACSM-CEP, Exercise Physiologist;Heather [place]ach[place]ak, BS, Exercise Physiologist   ° Virtual Visit No   ° Medication changes reported     No   ° Fall or balance concerns reported    Yes   ° Comments She frequently loses her balance but does not fall due to her knees and hips giving out.   ° Tobacco Cessation No Change   ° Warm-up and Cool-down Performed as group-led instruction   ° Resistance Training Performed Yes   ° VAD Patient? No   ° PAD/SET Patient? No   °  ° Pain Assessment  ° Currently in Pain? No/denies   ° Pain Score 0-No pain   ° °  °  ° °  ° ° °Capillary Blood Glucose: °No results found for this or any previous visit (from the past 24 hour(s)). ° ° ° °Social History  ° °Tobacco Use  °Smoking Status Former  ° Packs/day: 1.00  ° Types: Cigarettes  °Smokeless Tobacco Never  °Tobacco Comments  ° cut back to 1 pack a day. Smokig x 40yrs. Did smoke 2 packs x 5 yrs, but cut back a month ago.   ° ° °Goals Met:  °Independence with exercise equipment °Exercise tolerated well °No report of concerns or symptoms today °Strength training completed today ° °Goals Unmet:  °Not Applicable ° °Comments: checkout 1200  ° ° °Dr. Jonathan Branch is Medical Director for [facility] °

## 2021-10-01 ENCOUNTER — Other Ambulatory Visit: Payer: Self-pay

## 2021-10-01 ENCOUNTER — Ambulatory Visit: Payer: Medicare Other

## 2021-10-02 ENCOUNTER — Encounter (HOSPITAL_COMMUNITY)
Admission: RE | Admit: 2021-10-02 | Discharge: 2021-10-02 | Disposition: A | Discharge status: Home / Self Care | Payer: Medicare Other | Source: Ambulatory Visit | Attending: Cardiology | Admitting: Cardiology

## 2021-10-02 DIAGNOSIS — Z951 Presence of aortocoronary bypass graft: Secondary | ICD-10-CM

## 2021-10-02 NOTE — Progress Notes (Signed)
Cardiac Individual Treatment Plan  Patient Details  Name: Lauren David MRN: [id number] Date of Birth: 07/13/1961 Referring Provider:   Flowsheet Row CARDIAC REHAB PHASE II ORIENTATION from 08/09/2021 in [place]  Referring Provider Dr. Kipp Brood       Initial Encounter Date:  Flowsheet Row CARDIAC REHAB PHASE II ORIENTATION from 08/09/2021 in [place]  Date 08/09/21       Visit Diagnosis: S/P CABG x 4  Patient's Home Medications on Admission:  Current Outpatient Medications:    acetaminophen (TYLENOL) 500 MG tablet, Take 1,000 mg by mouth every 6 (six) hours as needed for moderate pain or headache., Disp: , Rfl:    albuterol (PROVENTIL) (2.5 MG/3ML) 0.083% nebulizer solution, ONE VIAL BY NEBULIZATION EVERY 6 HOURS AS NEEDED FOR WHEEZING OR SHORTNESS OF BREATH. (Patient taking differently: Take 2.5 mg by nebulization every 6 (six) hours as needed for wheezing.), Disp: 180 mL, Rfl: 0   albuterol (VENTOLIN HFA) 108 (90 Base) MCG/ACT inhaler, Inhale 2 puffs into the lungs every 4 (four) hours as needed for wheezing or shortness of breath., Disp: 17 g, Rfl: 0   amLODipine (NORVASC) 2.5 MG tablet, Take 1 tablet (2.5 mg total) by mouth daily., Disp: 30 tablet, Rfl: 5   aspirin 81 MG chewable tablet, Chew 4 tablets (324 mg total) by mouth daily. (Patient taking differently: Chew 81 mg by mouth daily.), Disp: , Rfl:    blood glucose meter kit and supplies, Dispense based on patient and insurance preference. Use up to four times daily as directed. (FOR ICD-10 E10.9, E11.9)., Disp: 1 each, Rfl: 0   dicyclomine (BENTYL) 10 MG capsule, Take 1 capsule (10 mg total) by mouth 3 (three) times daily as needed. (Patient taking differently: Take 10 mg by mouth 3 (three) times daily as needed for spasms.), Disp: 60 capsule, Rfl: 1   empagliflozin (JARDIANCE) 10 MG TABS tablet, Take 1 tablet (10 mg total) by mouth daily before breakfast., Disp: 30 tablet, Rfl:  2   glipiZIDE (GLUCOTROL) 5 MG tablet, Take 1 tablet (5 mg total) by mouth 2 (two) times daily before a meal., Disp: 60 tablet, Rfl: 3   metoprolol tartrate (LOPRESSOR) 25 MG tablet, Take 0.5 tablets (12.5 mg total) by mouth 2 (two) times daily., Disp: 60 tablet, Rfl: 1   nicotine (NICODERM CQ - DOSED IN MG/24 HOURS) 14 mg/24hr patch, Place 1 patch (14 mg total) onto the skin daily., Disp: 28 patch, Rfl: 1   rosuvastatin (CRESTOR) 20 MG tablet, Take 1 tablet (20 mg total) by mouth daily., Disp: 30 tablet, Rfl: 6   sitaGLIPtin (JANUVIA) 100 MG tablet, Take 1 tablet (100 mg total) by mouth daily., Disp: 30 tablet, Rfl: 0   traZODone (DESYREL) 50 MG tablet, Take 0.5-1 tablets (25-50 mg total) by mouth at bedtime as needed for sleep., Disp: 30 tablet, Rfl: 3   umeclidinium bromide (INCRUSE ELLIPTA) 62.5 MCG/INH AEPB, Inhale 1 puff into the lungs daily., Disp: 30 each, Rfl: 2   varenicline (CHANTIX CONTINUING MONTH PAK) 1 MG tablet, Take 1 tablet (1 mg total) by mouth 2 (two) times daily., Disp: 60 tablet, Rfl: 2  Past Medical History: Past Medical History:  Diagnosis Date   Anxiety    Asthma    CAD (coronary artery disease)    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 Smokig x 76yr. Did smoke 2 packs x 5 yrs, but cut back a month ago.     Labs: Recent Review Flowsheet Data     Labs for ITP Cardiac and Pulmonary Rehab Latest Ref Rng & Units 05/21/2021 05/21/2021 05/21/2021 05/22/2021 09/17/2021   Cholestrol 0 - 200 mg/dL - - - 105 -   LDLCALC 0 - 99 mg/dL - - - 64 -   HDL >40  mg/dL - - - 27(L) -   Trlycerides <150 mg/dL - - - 72 -   Hemoglobin A1c 4.8 - 5.6 % - - - - 6.7(H)   PHART 7.350 - 7.450 7.170(LL) 7.293(L) 7.306(L) - -   PCO2ART 32.0 - 48.0 mmHg 72.4(HH) 46.2 48.4(H) - -   HCO3 20.0 - 28.0 mmol/L 26.4 22.3 24.1 - -   TCO2 22 - 32 mmol/L _0 - -   ACIDBASEDEF 0.0 - 2.0 mmol/L 3.0(H) 4.0(H) 2.0 - -   O2SAT % 94.0 92.0 92.0 - -       Capillary Blood Glucose: Lab Results  Component Value Date   GLUCAP 202 (H) 08/09/2021   GLUCAP 196 (H) 05/26/2021   GLUCAP 124 (H) 05/26/2021   GLUCAP 150 (H) 05/25/2021   GLUCAP 141 (H) 05/25/2021     Exercise Target Goals: Exercise Program Goal: Individual exercise prescription set using results from initial 6 min walk test and THRR while considering  patients activity barriers and safety.   Exercise Prescription Goal: Starting with aerobic activity 30 plus minutes a day, 3 days per week for initial exercise prescription. Provide home exercise prescription and guidelines that participant acknowledges understanding prior to discharge.  Activity Barriers & Risk Stratification:  Activity Barriers & Cardiac Risk Stratification - 08/09/21 1324       Activity Barriers & Cardiac Risk Stratification   Activity Barriers Arthritis;Back Problems;Joint Problems;Deconditioning;Shortness of Breath;Balance Concerns    Cardiac Risk Stratification High             6 Minute Walk:  6 Minute Walk     Row Name 08/09/21 1457         6 Minute Walk   Phase Initial     Distance 1150 feet     Walk Time 6 minutes     # of Rest Breaks 0     MPH 2.18     METS 2.41     RPE 13     VO2 Peak 8.43     Symptoms Yes (comment)     Comments R hip pain 3/10     Resting HR 70 bpm     Resting BP 102/56     Resting Oxygen Saturation  98 %     Exercise Oxygen Saturation  during 6 min walk 98 %     Max Ex. HR 71 bpm     Max Ex. BP 118/66     2 Minute Post BP 104/58              Oxygen Initial  Assessment:   Oxygen Re-Evaluation:   Oxygen Discharge (Final Oxygen Re-Evaluation):   Initial Exercise Prescription:  Initial Exercise  Prescription - 08/09/21 1400       Date of Initial Exercise RX and Referring Provider   Date 08/09/21    Referring Provider Dr. Kipp Brood    Expected Discharge Date 11/01/21      Treadmill   MPH 1    Grade 0    Minutes 17      NuStep   Level 1    SPM 80    Minutes 22      Prescription Details   Frequency (times per week) 3    Duration Progress to 30 minutes of continuous aerobic without signs/symptoms of physical distress      Intensity   THRR 40-80% of Max Heartrate 63-126    Ratings of Perceived Exertion 11-13    Perceived Dyspnea 0-4      Resistance Training   Training Prescription Yes    Weight 2 lbs    Reps 10-15             Perform Capillary Blood Glucose checks as needed.  Exercise Prescription Changes:   Exercise Prescription Changes     Row Name 08/19/21 1300 08/30/21 1100 09/02/21 1300 09/16/21 1300 09/30/21 1200     Response to Exercise   Blood Pressure (Admit) 120/60 -- 128/60 118/58 106/54   Blood Pressure (Exercise) 125/65 -- 110/66 112/68 126/60   Blood Pressure (Exit) 120/60 -- 110/60 102/60 116/58   Heart Rate (Admit) 70 bpm -- 68 bpm 69 bpm 66 bpm   Heart Rate (Exercise) 79 bpm -- 76 bpm 83 bpm 81 bpm   Heart Rate (Exit) 79 bpm -- 77 bpm 78 bpm 75 bpm   Rating of Perceived Exertion (Exercise) 12 -- _0 Duration Continue with 30 min of aerobic exercise without signs/symptoms of physical distress. -- Continue with 30 min of aerobic exercise without signs/symptoms of physical distress. Continue with 30 min of aerobic exercise without signs/symptoms of physical distress. Continue with 30 min of aerobic exercise without signs/symptoms of physical distress.   Intensity THRR unchanged -- THRR unchanged THRR unchanged THRR unchanged     Progression   Progression Continue to progress workloads to  maintain intensity without signs/symptoms of physical distress. -- Continue to progress workloads to maintain intensity without signs/symptoms of physical distress. Continue to progress workloads to maintain intensity without signs/symptoms of physical distress. Continue to progress workloads to maintain intensity without signs/symptoms of physical distress.     Resistance Training   Training Prescription Yes -- Yes Yes Yes   Weight 2 lbs -- _1 Reps 10-15 -- 10-15 10-15 10-15   Time 10 Minutes -- 10 Minutes 10 Minutes 10 Minutes     Treadmill   MPH 1 -- 1.2 1.6 1.6   Grade 0 -- 0 0 0   Minutes 17 -- _2 METs 1.77 -- 1.92 2.23 2.23     NuStep   Level 1 -- _3 SPM 64 -- 78 98 93   Minutes 22 -- _4 METs 1.58 -- 1.7 2.06 2.1     Home Exercise Plan   Plans to continue exercise at -- Home (comment) -- -- --   Frequency -- Add 2 additional days to program exercise sessions. -- -- --   Initial Home Exercises Provided -- 08/30/21 -- -- --            Exercise Comments:   Exercise Comments     Row  Name 08/30/21 1140           Exercise Comments home exercise reviewed                Exercise Goals and Review:   Exercise Goals     Row Name 08/09/21 1500 09/02/21 1357 09/30/21 1233         Exercise Goals   Increase Physical Activity Yes Yes Yes     Intervention Provide advice, education, support and counseling about physical activity/exercise needs.;Develop an individualized exercise prescription for aerobic and resistive training based on initial evaluation findings, risk stratification, comorbidities and participant's personal goals. Provide advice, education, support and counseling about physical activity/exercise needs.;Develop an individualized exercise prescription for aerobic and resistive training based on initial evaluation findings, risk stratification, comorbidities and participant's personal goals. Provide advice, education, support and  counseling about physical activity/exercise needs.;Develop an individualized exercise prescription for aerobic and resistive training based on initial evaluation findings, risk stratification, comorbidities and participant's personal goals.     Expected Outcomes Short Term: Attend rehab on a regular basis to increase amount of physical activity.;Long Term: Add in home exercise to make exercise part of routine and to increase amount of physical activity.;Long Term: Exercising regularly at least 3-5 days a week. Short Term: Attend rehab on a regular basis to increase amount of physical activity.;Long Term: Add in home exercise to make exercise part of routine and to increase amount of physical activity.;Long Term: Exercising regularly at least 3-5 days a week. Short Term: Attend rehab on a regular basis to increase amount of physical activity.;Long Term: Add in home exercise to make exercise part of routine and to increase amount of physical activity.;Long Term: Exercising regularly at least 3-5 days a week.     Increase Strength and Stamina Yes Yes Yes     Intervention Provide advice, education, support and counseling about physical activity/exercise needs.;Develop an individualized exercise prescription for aerobic and resistive training based on initial evaluation findings, risk stratification, comorbidities and participant's personal goals. Provide advice, education, support and counseling about physical activity/exercise needs.;Develop an individualized exercise prescription for aerobic and resistive training based on initial evaluation findings, risk stratification, comorbidities and participant's personal goals. Provide advice, education, support and counseling about physical activity/exercise needs.;Develop an individualized exercise prescription for aerobic and resistive training based on initial evaluation findings, risk stratification, comorbidities and participant's personal goals.     Expected  Outcomes Short Term: Increase workloads from initial exercise prescription for resistance, speed, and METs.;Short Term: Perform resistance training exercises routinely during rehab and add in resistance training at home;Long Term: Improve cardiorespiratory fitness, muscular endurance and strength as measured by increased METs and functional capacity (6MWT) Short Term: Increase workloads from initial exercise prescription for resistance, speed, and METs.;Short Term: Perform resistance training exercises routinely during rehab and add in resistance training at home;Long Term: Improve cardiorespiratory fitness, muscular endurance and strength as measured by increased METs and functional capacity (6MWT) Short Term: Increase workloads from initial exercise prescription for resistance, speed, and METs.;Short Term: Perform resistance training exercises routinely during rehab and add in resistance training at home;Long Term: Improve cardiorespiratory fitness, muscular endurance and strength as measured by increased METs and functional capacity (6MWT)     Able to understand and use rate of perceived exertion (RPE) scale Yes Yes Yes     Intervention Provide education and explanation on how to use RPE scale Provide education and explanation on how to use RPE scale Provide education and explanation on how to  use RPE scale     Expected Outcomes Short Term: Able to use RPE daily in rehab to express subjective intensity level;Long Term:  Able to use RPE to guide intensity level when exercising independently Short Term: Able to use RPE daily in rehab to express subjective intensity level;Long Term:  Able to use RPE to guide intensity level when exercising independently Short Term: Able to use RPE daily in rehab to express subjective intensity level;Long Term:  Able to use RPE to guide intensity level when exercising independently     Knowledge and understanding of Target Heart Rate Range (THRR) Yes Yes Yes     Intervention  Provide education and explanation of THRR including how the numbers were predicted and where they are located for reference Provide education and explanation of THRR including how the numbers were predicted and where they are located for reference Provide education and explanation of THRR including how the numbers were predicted and where they are located for reference     Expected Outcomes Short Term: Able to state/look up THRR;Long Term: Able to use THRR to govern intensity when exercising independently;Short Term: Able to use daily as guideline for intensity in rehab Short Term: Able to state/look up THRR;Long Term: Able to use THRR to govern intensity when exercising independently;Short Term: Able to use daily as guideline for intensity in rehab Short Term: Able to state/look up THRR;Long Term: Able to use THRR to govern intensity when exercising independently;Short Term: Able to use daily as guideline for intensity in rehab     Able to check pulse independently Yes Yes Yes     Intervention Provide education and demonstration on how to check pulse in carotid and radial arteries.;Review the importance of being able to check your own pulse for safety during independent exercise Provide education and demonstration on how to check pulse in carotid and radial arteries.;Review the importance of being able to check your own pulse for safety during independent exercise Provide education and demonstration on how to check pulse in carotid and radial arteries.;Review the importance of being able to check your own pulse for safety during independent exercise     Expected Outcomes Short Term: Able to explain why pulse checking is important during independent exercise;Long Term: Able to check pulse independently and accurately Short Term: Able to explain why pulse checking is important during independent exercise;Long Term: Able to check pulse independently and accurately Short Term: Able to explain why pulse checking is  important during independent exercise;Long Term: Able to check pulse independently and accurately     Understanding of Exercise Prescription Yes Yes Yes     Intervention Provide education, explanation, and written materials on patient's individual exercise prescription Provide education, explanation, and written materials on patient's individual exercise prescription Provide education, explanation, and written materials on patient's individual exercise prescription     Expected Outcomes Short Term: Able to explain program exercise prescription;Long Term: Able to explain home exercise prescription to exercise independently Short Term: Able to explain program exercise prescription;Long Term: Able to explain home exercise prescription to exercise independently Short Term: Able to explain program exercise prescription;Long Term: Able to explain home exercise prescription to exercise independently              Exercise Goals Re-Evaluation :  Exercise Goals Re-Evaluation     Row Name 09/02/21 1357 09/30/21 1233           Exercise Goal Re-Evaluation   Exercise Goals Review Increase Strength and Stamina;Increase Physical Activity;Able  to understand and use rate of perceived exertion (RPE) scale;Knowledge and understanding of Target Heart Rate Range (THRR);Able to check pulse independently;Understanding of Exercise Prescription Increase Strength and Stamina;Increase Physical Activity;Able to understand and use rate of perceived exertion (RPE) scale;Knowledge and understanding of Target Heart Rate Range (THRR);Able to check pulse independently;Understanding of Exercise Prescription      Comments Pt has completed 11 sessions of cardiac rehab. She is progressing well and is motivated to improve her fitness. She is currently exercising at 1.92 METs. Will continue to monitor and progress as able. Pt has completed 23 sessions of cardiac rehab. Her attendance has been good and she is motivated to improve her  workloads. She reports that she feels as if she is getting stronger. She is currently exercising at 2.23 METs. Will continue to monitor and progress as able.      Expected Outcomes Through exercise at home and at rehab, the patient will meet their stated goals. Through exercise at home and at rehab, the patient will meet their stated goals.                Discharge Exercise Prescription (Final Exercise Prescription Changes):  Exercise Prescription Changes - 09/30/21 1200       Response to Exercise   Blood Pressure (Admit) 106/54    Blood Pressure (Exercise) 126/60    Blood Pressure (Exit) 116/58    Heart Rate (Admit) 66 bpm    Heart Rate (Exercise) 81 bpm    Heart Rate (Exit) 75 bpm    Rating of Perceived Exertion (Exercise) 12    Duration Continue with 30 min of aerobic exercise without signs/symptoms of physical distress.    Intensity THRR unchanged      Progression   Progression Continue to progress workloads to maintain intensity without signs/symptoms of physical distress.      Resistance Training   Training Prescription Yes    Weight 2    Reps 10-15    Time 10 Minutes      Treadmill   MPH 1.6    Grade 0    Minutes 22    METs 2.23      NuStep   Level 3    SPM 93    Minutes 17    METs 2.1             Nutrition:  Target Goals: Understanding of nutrition guidelines, daily intake of sodium <1526m, cholesterol <2055m calories 30% from fat and 7% or less from saturated fats, daily to have 5 or more servings of fruits and vegetables.  Biometrics:  Pre Biometrics - 08/09/21 1500       Pre Biometrics   Height 5' 1" (1.549 m)    Weight 75.8 kg    Waist Circumference 41.5 inches    Hip Circumference 44.5 inches    Waist to Hip Ratio 0.93 %    BMI (Calculated) 31.59    Triceps Skinfold 29 mm    % Body Fat 43.7 %    Grip Strength 25.1 kg    Flexibility 10.5 in    Single Leg Stand 10 seconds              Nutrition Therapy Plan and Nutrition  Goals:  Nutrition Therapy & Goals - 08/26/21 0909       Personal Nutrition Goals   Comments Patient socred 24 on her diet assessment. We offer 2 educational sessions on heart healthy nutrition with handouts and assistance with RD referrals if patient is  interested.      Intervention Plan   Intervention Nutrition handout(s) given to patient.    Expected Outcomes Short Term Goal: Understand basic principles of dietary content, such as calories, fat, sodium, cholesterol and nutrients.             Nutrition Assessments:  Nutrition Assessments - 08/09/21 1331       MEDFICTS Scores   Pre Score 24            MEDIFICTS Score Key: ?70 Need to make dietary changes  40-70 Heart Healthy Diet ? 40 Therapeutic Level Cholesterol Diet   Picture Your Plate Scores: <86 Unhealthy dietary pattern with much room for improvement. 41-50 Dietary pattern unlikely to meet recommendations for good health and room for improvement. 51-60 More healthful dietary pattern, with some room for improvement.  >60 Healthy dietary pattern, although there may be some specific behaviors that could be improved.    Nutrition Goals Re-Evaluation:   Nutrition Goals Discharge (Final Nutrition Goals Re-Evaluation):   Psychosocial: Target Goals: Acknowledge presence or absence of significant depression and/or stress, maximize coping skills, provide positive support system. Participant is able to verbalize types and ability to use techniques and skills needed for reducing stress and depression.  Initial Review & Psychosocial Screening:  Initial Psych Review & Screening - 08/09/21 1328       Initial Review   Current issues with Current Anxiety/Panic;Current Sleep Concerns      Family Dynamics   Good Support System? Yes    Comments Her support system include three of her grandchildren who live with her. One of her daughters also supports her.      Barriers   Psychosocial barriers to participate in  program There are no identifiable barriers or psychosocial needs.      Screening Interventions   Interventions Encouraged to exercise;Provide feedback about the scores to participant    Expected Outcomes Long Term goal: The participant improves quality of Life and PHQ9 Scores as seen by post scores and/or verbalization of changes;Short Term goal: Identification and review with participant of any Quality of Life or Depression concerns found by scoring the questionnaire.             Quality of Life Scores:  Quality of Life - 08/09/21 1503       Quality of Life   Select Quality of Life      Quality of Life Scores   Health/Function Pre 24.43 %    Socioeconomic Pre 29.14 %    Psych/Spiritual Pre 27.36 %    Family Pre 27 %    GLOBAL Pre 26.42 %            Scores of 19 and below usually indicate a poorer quality of life in these areas.  A difference of  2-3 points is a clinically meaningful difference.  A difference of 2-3 points in the total score of the Quality of Life Index has been associated with significant improvement in overall quality of life, self-image, physical symptoms, and general health in studies assessing change in quality of life.  PHQ-9: Recent Review Flowsheet Data     Depression screen [facility] 2/9 09/17/2021 08/09/2021 06/06/2021 03/11/2021 03/11/2021   Decreased Interest 0 0 0 0 0   Down, Depressed, Hopeless 0 0 0 0 0   PHQ - 2 Score 0 0 0 0 0   Altered sleeping - 2  - - -   Tired, decreased energy - 1 - - -   Change  in appetite - 1 - - -   Feeling bad or failure about yourself  - 0 - - -   Trouble concentrating - 0 - - -   Moving slowly or fidgety/restless - 0 - - -   Suicidal thoughts - 0 - - -   PHQ-9 Score - 4 - - -   Difficult doing work/chores - Not difficult at all - - -      Interpretation of Total Score  Total Score Depression Severity:  1-4 = Minimal depression, 5-9 = Mild depression, 10-14 = Moderate depression, 15-19 = Moderately severe depression,  20-27 = Severe depression   Psychosocial Evaluation and Intervention:  Psychosocial Evaluation - 08/09/21 1503       Psychosocial Evaluation & Interventions   Interventions Stress management education;Relaxation education;Encouraged to exercise with the program and follow exercise prescription    Comments Pt has no barriers to completing cardiac rehab. She does have anxiety but no other psychosocial concerns. She stated that she has been on medication and seen a therapist in the past, but that she no longer requires it anymore. She states that she has her anxiety under control. She does report that she has trouble with her sleep, but does not have any specific reason as to why, nor does she take anything for sleep. She reports that she has a good support system with her daughter and her three adult grand children who live with her. Her goals in the program are to lose weight, gain strength, and to improve her mobility. Another goal is to continue to to improve her diabetic management. She has made improvements with her diet and is now taking all of her prescribed medications for this. She is eager to start the program.    Expected Outcomes Pt's anxiety will continue to be controlled and she will have no other identfiable psychosocial issues.    Continue Psychosocial Services  No Follow up required             Psychosocial Re-Evaluation:  Psychosocial Re-Evaluation     Pinch Name 08/26/21 0913 09/23/21 1249           Psychosocial Re-Evaluation   Current issues with Current Anxiety/Panic;Current Sleep Concerns Current Anxiety/Panic;Current Sleep Concerns      Comments Patient is new to the program completing 7 sessions. She continues to have no psychosocial barriers identified. She seems to enjoy coming to the program and demonstrates an interest in improving her health. We will continue to montior her progress. Patient has completed 7 sessions. She saw her pcp 2/14 and reported to him she  felt her chronic insomina impeded her ability in cardiac rehab. He added Trazadone. We will continue to monitor.  She continues to enjoy coming to the program and  and demonstrates an interest in improving her health. We will continue to montior her progress.      Expected Outcomes Patient will continue to have no psychosocial barriers identified. Patient will continue to have no psychosocial barriers identified.      Interventions Stress management education;Encouraged to attend Cardiac Rehabilitation for the exercise;Relaxation education Stress management education;Encouraged to attend Cardiac Rehabilitation for the exercise;Relaxation education      Continue Psychosocial Services  No Follow up required No Follow up required               Psychosocial Discharge (Final Psychosocial Re-Evaluation):  Psychosocial Re-Evaluation - 09/23/21 1249       Psychosocial Re-Evaluation   Current issues with Current  Anxiety/Panic;Current Sleep Concerns    Comments Patient has completed 7 sessions. She saw her pcp 2/14 and reported to him she felt her chronic insomina impeded her ability in cardiac rehab. He added Trazadone. We will continue to monitor.  She continues to enjoy coming to the program and  and demonstrates an interest in improving her health. We will continue to montior her progress.    Expected Outcomes Patient will continue to have no psychosocial barriers identified.    Interventions Stress management education;Encouraged to attend Cardiac Rehabilitation for the exercise;Relaxation education    Continue Psychosocial Services  No Follow up required             Vocational Rehabilitation: Provide vocational rehab assistance to qualifying candidates.   Vocational Rehab Evaluation & Intervention:  Vocational Rehab - 08/09/21 1335       Initial Vocational Rehab Evaluation & Intervention   Assessment shows need for Vocational Rehabilitation No   on disability             Education: Education Goals: Education classes will be provided on a weekly basis, covering required topics. Participant will state understanding/return demonstration of topics presented.  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 Discus how high blood pressure affects the body and ways to reduce high blood pressure. Flowsheet Row CARDIAC REHAB PHASE II EXERCISE from 09/25/2021 in [place]  Date 08/14/21  Educator [place]  Instruction Review Code 1- Verbalizes Understanding       Exercise and Your Heart -Discuss why it is important to exercise, the FITT principles of exercise, normal and abnormal responses to exercise, and how to exercise safely. Flowsheet Row CARDIAC REHAB PHASE II EXERCISE from 09/25/2021 in [place]  Date 08/21/21  Educator [place]  Instruction Review Code 2- Demonstrated Understanding       Angina -Discuss definition of angina, causes of angina, treatment of angina, and how to decrease risk of having angina. Flowsheet Row CARDIAC REHAB PHASE II EXERCISE from 09/25/2021 in [place]  Date 08/28/21  Educator pb  Instruction Review Code 1- Verbalizes Understanding       Cardiac Medications -Review what the following cardiac medications are used for, how they affect the body, and side effects that may occur when taking the medications.  Medications include Aspirin, Beta blockers, calcium channel blockers, ACE Inhibitors, angiotensin receptor blockers, diuretics, digoxin, and antihyperlipidemics.   Congestive Heart Failure -Discuss the definition of CHF, how to live with CHF, the signs and  symptoms of CHF, and how keep track of weight and sodium intake. Flowsheet Row CARDIAC REHAB PHASE II EXERCISE from 09/25/2021 in [place]  Date 09/04/21  Educator [place]  Instruction Review Code 2- Demonstrated Understanding       Heart Disease and Intimacy -Discus the effect sexual activity has on the heart, how changes occur during intimacy as we age, and safety during sexual activity. Flowsheet Row CARDIAC REHAB PHASE II EXERCISE from 09/25/2021 in [place]  Date 09/11/21  Educator [place]  Instruction Review Code 2- Demonstrated Understanding       Smoking Cessation / COPD -Discuss different methods to quit smoking, the health  benefits of quitting smoking, and the definition of COPD. Flowsheet Row CARDIAC REHAB PHASE II EXERCISE from 09/25/2021 in [place]  Date 09/18/21  Educator [place]  Instruction Review Code 1- Verbalizes Understanding       Nutrition I: Fats -Discuss the types of cholesterol, what cholesterol does to the heart, and how cholesterol levels can be controlled. Flowsheet Row CARDIAC REHAB PHASE II EXERCISE from 09/25/2021 in [place]  Date 09/25/21  Educator [place]  Instruction Review Code 2- Demonstrated Understanding       Nutrition II: Labels -Discuss the different components of food labels and how to read food label   Heart Parts/Heart Disease and PAD -Discuss the anatomy of the heart, the pathway of blood circulation through the heart, and these are affected by heart disease.   Stress I: Signs and Symptoms -Discuss the causes of stress, how stress may lead to anxiety and depression, and ways to limit stress.   Stress II: Relaxation -Discuss different types of relaxation techniques to limit stress.   Warning Signs of Stroke / TIA -Discuss definition of a stroke, what the signs and symptoms are of a stroke, and how to identify when someone is having  stroke.   Knowledge Questionnaire Score:  Knowledge Questionnaire Score - 08/09/21 1333       Knowledge Questionnaire Score   Pre Score 22/24             Core Components/Risk Factors/Patient Goals at Admission:  Personal Goals and Risk Factors at Admission - 08/09/21 1335       Core Components/Risk Factors/Patient Goals on Admission    Weight Management Yes;Weight Loss;Obesity    Intervention Weight Management: Develop a combined nutrition and exercise program designed to reach desired caloric intake, while maintaining appropriate intake of nutrient and fiber, sodium and fats, and appropriate energy expenditure required for the weight goal.;Weight Management: Provide education and appropriate resources to help participant work on and attain dietary goals.;Weight Management/Obesity: Establish reasonable short term and long term weight goals.;Obesity: Provide education and appropriate resources to help participant work on and attain dietary goals.    Expected Outcomes Short Term: Continue to assess and modify interventions until short term weight is achieved;Long Term: Adherence to nutrition and physical activity/exercise program aimed toward attainment of established weight goal;Weight Maintenance: Understanding of the daily nutrition guidelines, which includes 25-35% calories from fat, 7% or less cal from saturated fats, less than 254m cholesterol, less than 1.5gm of sodium, & 5 or more servings of fruits and vegetables daily;Weight Loss: Understanding of general recommendations for a balanced deficit meal plan, which promotes 1-2 lb weight loss per week and includes a negative energy balance of [phone] kcal/d;Understanding recommendations for meals to include 15-35% energy as protein, 25-35% energy from fat, 35-60% energy from carbohydrates, less than 2033mof dietary cholesterol, 20-35 gm of total fiber daily;Understanding of distribution of calorie intake throughout the day with the  consumption of 4-5 meals/snacks    Improve shortness of breath with ADL's Yes    Intervention Provide education, individualized exercise plan and daily activity instruction to help decrease symptoms of SOB with activities of daily living.    Expected Outcomes Short Term: Improve cardiorespiratory fitness to achieve a reduction of symptoms when performing ADLs;Long Term: Be able to perform more ADLs without symptoms or delay the onset of symptoms    Diabetes Yes    Intervention Provide education about signs/symptoms and action to take for hypo/hyperglycemia.;Provide education about proper nutrition, including  hydration, and aerobic/resistive exercise prescription along with prescribed medications to achieve blood glucose in normal ranges: Fasting glucose 65-99 mg/dL    Expected Outcomes Long Term: Attainment of HbA1C < 7%.;Short Term: Participant verbalizes understanding of the signs/symptoms and immediate care of hyper/hypoglycemia, proper foot care and importance of medication, aerobic/resistive exercise and nutrition plan for blood glucose control.    Personal Goal Other Yes    Personal Goal improve strength and mobility    Intervention Attend cardiac rehab and begin a home exercise program.    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 She has multiple risk factors for CAD and is participating in the program for risk modification. She has completed 7 sessions. Her initial weight was 167.0 lbs and her current weight is 165.2 lbs. Her blood pressure is well controlled. Her personal goals for the program are to  improve her strength nad mobility and to lose weight. We will continue to monitor her progress as she works towards meeting these goals. Patient has completed 19 sessions. Her current weight is 170.7 lbs up 3 lbs from last 30 day review. She continues to do well in the program with consistent attendance and progressions. Her blood pressure is at goal. She saw her pcp 2/15 and requested to go back on Chantix. She quit smoking when she had her CABG and fills this will help her abstain and deal with her urges to smoke. She saw Dr. Domenic Polite 2/15 for routine check. He decreased her ASA to 81 mg daily. Her personal goals continue to be to improve her strength and mobility and lose weight. We will continue to monitor her progress as she works towards these goals.      Expected Outcomes Patient will complete the program meeting both personal and program goals. Patient will complete the program meeting both personal and program goals.               Core Components/Risk Factors/Patient Goals at Discharge (Final Review):   Goals and Risk Factor Review - 09/23/21 1251       Core Components/Risk Factors/Patient Goals Review   Personal Goals Review Weight Management/Obesity;Diabetes;Other    Review Patient has completed 19 sessions. Her current weight is 170.7 lbs up 3 lbs from last 30 day review. She continues to do well in the program with consistent attendance and progressions. Her blood pressure is at goal. She saw her pcp 2/15 and requested to go back on Chantix. She quit smoking when she had her CABG and fills this will help her abstain and deal with her urges to smoke. She saw Dr. Domenic Polite 2/15 for routine check. He decreased her ASA to 81 mg daily. Her personal goals continue to be to improve her strength and mobility and lose weight. We will continue to monitor her progress as she works towards these goals.    Expected Outcomes Patient will complete the program meeting both personal and program goals.              ITP Comments:   Comments: ITP REVIEW Pt is  making expected progress toward Cardiac Rehab goals after completing 23 sessions. Recommend continued exercise, life style modification, education, and increased stamina and strength.

## 2021-10-02 NOTE — Progress Notes (Signed)
Daily Session Note ? ?Patient Details  ?Name: Lauren David ?MRN: [id number] ?Date of Birth: Sep 19, 1960 ?Referring Provider:   ?Flowsheet Row CARDIAC REHAB PHASE II ORIENTATION from 08/09/2021 in [place]  ?Referring Provider Dr. Kipp Brood  ? ?  ? ? ?Encounter Date: 10/02/2021 ? ?Check In: ? Session Check In - 10/02/21 1100   ? ?  ? Check-In  ? Supervising physician immediately available to respond to emergencies [facility] MD immediately available   ? Physician(s) Dr Harl Bowie   ? Location [facility]   ? Staff Present Hoy Register, MS, ACSM-CEP, Exercise Physiologist;Heather Otho Ket, BS, Exercise Physiologist;Nirvana Blanchett Wynetta Emery, RN, BSN   ? Virtual Visit No   ? Medication changes reported     No   ? Fall or balance concerns reported    Yes   ? Comments She frequently loses her balance but does not fall due to her knees and hips giving out.   ? Tobacco Cessation No Change   ? Warm-up and Cool-down Performed as group-led instruction   ? Resistance Training Performed Yes   ? VAD Patient? No   ? PAD/SET Patient? No   ?  ? Pain Assessment  ? Currently in Pain? No/denies   ? Pain Score 0-No pain   ? Multiple Pain Sites No   ? ?  ?  ? ?  ? ? ?Capillary Blood Glucose: ?No results found for this or any previous visit (from the past 24 hour(s)). ? ? ? ?Social History  ? ?Tobacco Use  ?Smoking Status Former  ? Packs/day: 1.00  ? Types: Cigarettes  ?Smokeless Tobacco Never  ?Tobacco Comments  ? cut back to 1 pack a day. Smokig x 72yr. Did smoke 2 packs x 5 yrs, but cut back a month ago.   ? ? ?Goals Met:  ?Independence with exercise equipment ?Exercise tolerated well ?No report of concerns or symptoms today ?Strength training completed today ? ?Goals Unmet:  ?Not Applicable ? ?Comments: Check out 1200. ? ? ?Dr. JCarlyle Dollyis Medical Director for [place]?

## 2021-10-04 ENCOUNTER — Encounter (HOSPITAL_COMMUNITY)
Admission: RE | Admit: 2021-10-04 | Discharge: 2021-10-04 | Disposition: A | Discharge status: Home / Self Care | Payer: Medicare Other | Source: Ambulatory Visit | Attending: Cardiology | Admitting: Cardiology

## 2021-10-04 DIAGNOSIS — Z951 Presence of aortocoronary bypass graft: Secondary | ICD-10-CM | POA: Diagnosis not present

## 2021-10-04 NOTE — Progress Notes (Signed)
Daily Session Note ? ?Patient Details  ?Name: Lauren David ?MRN: [id number] ?Date of Birth: April 29, 1961 ?Referring Provider:   ?Flowsheet Row CARDIAC REHAB PHASE II ORIENTATION from 08/09/2021 in [place]  ?Referring Provider Dr. Kipp Brood  ? ?  ? ? ?Encounter Date: 10/04/2021 ? ?Check In: ? Session Check In - 10/04/21 1059   ? ?  ? Check-In  ? Supervising physician immediately available to respond to emergencies [facility] MD immediately available   ? Physician(s) Dr Harl Bowie   ? Location [facility]   ? Staff Present Hoy Register, MS, ACSM-CEP, Exercise Physiologist;Debra Wynetta Emery, RN, BSN;Darcee Dekker, RN   ? Virtual Visit No   ? Medication changes reported     No   ? Fall or balance concerns reported    Yes   ? Comments She frequently loses her balance but does not fall due to her knees and hips giving out.   ? Tobacco Cessation No Change   ? Warm-up and Cool-down Performed as group-led instruction   ? Resistance Training Performed Yes   ? VAD Patient? No   ? PAD/SET Patient? No   ?  ? Pain Assessment  ? Currently in Pain? No/denies   ? Pain Score 0-No pain   ? Multiple Pain Sites No   ? ?  ?  ? ?  ? ? ?Capillary Blood Glucose: ?No results found for this or any previous visit (from the past 24 hour(s)). ? ? ? ?Social History  ? ?Tobacco Use  ?Smoking Status Former  ? Packs/day: 1.00  ? Types: Cigarettes  ?Smokeless Tobacco Never  ?Tobacco Comments  ? cut back to 1 pack a day. Smokig x 39yr. Did smoke 2 packs x 5 yrs, but cut back a month ago.   ? ? ?Goals Met:  ?Independence with exercise equipment ?Exercise tolerated well ?No report of concerns or symptoms today ?Strength training completed today ? ?Goals Unmet:  ?Not Applicable ? ?Comments: check out 12:00pm ? ? ?Dr. JCarlyle Dollyis Medical Director for [place]?

## 2021-10-07 ENCOUNTER — Encounter (HOSPITAL_COMMUNITY)
Admission: RE | Admit: 2021-10-07 | Discharge: 2021-10-07 | Disposition: A | Discharge status: Home / Self Care | Payer: Medicare Other | Source: Ambulatory Visit | Attending: Cardiology | Admitting: Cardiology

## 2021-10-07 DIAGNOSIS — Z951 Presence of aortocoronary bypass graft: Secondary | ICD-10-CM | POA: Diagnosis not present

## 2021-10-07 NOTE — Progress Notes (Signed)
Daily Session Note ? ?Patient Details  ?Name: Lauren David ?MRN: [id number] ?Date of Birth: 01-25-1961 ?Referring Provider:   ?Flowsheet Row CARDIAC REHAB PHASE II ORIENTATION from 08/09/2021 in [employer]  ?Referring Provider Dr. Kipp Brood  ? ?  ? ? ?Encounter Date: 10/07/2021 ? ?Check In: ? Session Check In - 10/07/21 1100   ? ?  ? Check-In  ? Supervising physician immediately available to respond to emergencies [facility] MD immediately available   ? Physician(s) Dr Johnsie Cancel   ? Location [facility]   ? Staff Present Jilda Roche, RN, Bjorn Loser, MS, ACSM-CEP, Exercise Physiologist;Heather Zigmund Braidyn Scorsone, Exercise Physiologist   ? Virtual Visit No   ? Medication changes reported     No   ? Fall or balance concerns reported    Yes   ? Comments She frequently loses her balance but does not fall due to her knees and hips giving out.   ? Tobacco Cessation No Change   ? Warm-up and Cool-down Performed as group-led instruction   ? Resistance Training Performed Yes   ? VAD Patient? No   ?  ? Pain Assessment  ? Currently in Pain? No/denies   ? Pain Score 0-No pain   ? ?  ?  ? ?  ? ? ?Capillary Blood Glucose: ?No results found for this or any previous visit (from the past 24 hour(s)). ? ? ? ?Social History  ? ?Tobacco Use  ?Smoking Status Former  ? Packs/day: 1.00  ? Types: Cigarettes  ?Smokeless Tobacco Never  ?Tobacco Comments  ? cut back to 1 pack a day. Smokig x 62yr. Did smoke 2 packs x 5 yrs, but cut back a month ago.   ? ? ?Goals Met:  ?Independence with exercise equipment ?Exercise tolerated well ?No report of concerns or symptoms today ?Strength training completed today ? ?Goals Unmet:  ?Not Applicable ? ?Comments: checkout 1200 ? ? ? ?Dr. JCarlyle Dollyis Medical Director for [place]?

## 2021-10-09 ENCOUNTER — Encounter (HOSPITAL_COMMUNITY)
Admission: RE | Admit: 2021-10-09 | Discharge: 2021-10-09 | Disposition: A | Discharge status: Home / Self Care | Payer: Medicare Other | Source: Ambulatory Visit | Attending: Cardiology | Admitting: Cardiology

## 2021-10-09 DIAGNOSIS — Z951 Presence of aortocoronary bypass graft: Secondary | ICD-10-CM | POA: Diagnosis not present

## 2021-10-09 NOTE — Progress Notes (Signed)
Daily Session Note ? ?Patient Details  ?Name: Lauren David ?MRN: [id number] ?Date of Birth: 05-Nov-1960 ?Referring Provider:   ?Flowsheet Row CARDIAC REHAB PHASE II ORIENTATION from 08/09/2021 in [place]  ?Referring Provider Dr. Kipp Brood  ? ?  ? ? ?Encounter Date: 10/09/2021 ? ?Check In: ? Session Check In - 10/09/21 1100   ? ?  ? Check-In  ? Supervising physician immediately available to respond to emergencies [facility] MD immediately available   ? Physician(s) Dr. Domenic Polite   ? Location [facility]   ? Staff Present Hoy Register, MS, ACSM-CEP, Exercise Physiologist;Heather Otho Ket, BS, Exercise Physiologist;Debra Wynetta Emery, RN, BSN   ? Virtual Visit No   ? Medication changes reported     No   ? Fall or balance concerns reported    Yes   ? Comments She frequently loses her balance but does not fall due to her knees and hips giving out.   ? Tobacco Cessation No Change   ? Warm-up and Cool-down Performed as group-led instruction   ? Resistance Training Performed Yes   ? VAD Patient? No   ? PAD/SET Patient? No   ?  ? Pain Assessment  ? Currently in Pain? No/denies   ? Pain Score 0-No pain   ? Multiple Pain Sites No   ? ?  ?  ? ?  ? ? ?Capillary Blood Glucose: ?No results found for this or any previous visit (from the past 24 hour(s)). ? ? ? ?Social History  ? ?Tobacco Use  ?Smoking Status Former  ? Packs/day: 1.00  ? Types: Cigarettes  ?Smokeless Tobacco Never  ?Tobacco Comments  ? cut back to 1 pack a day. Smokig x 23yr. Did smoke 2 packs x 5 yrs, but cut back a month ago.   ? ? ?Goals Met:  ?Independence with exercise equipment ?Exercise tolerated well ?No report of concerns or symptoms today ?Strength training completed today ? ?Goals Unmet:  ?Not Applicable ? ?Comments: checkout time is 1200 ? ? ?Dr. JCarlyle Dollyis Medical Director for [facility]?

## 2021-10-11 ENCOUNTER — Encounter (HOSPITAL_COMMUNITY)
Admission: RE | Admit: 2021-10-11 | Discharge: 2021-10-11 | Disposition: A | Discharge status: Home / Self Care | Payer: Medicare Other | Source: Ambulatory Visit | Attending: Cardiology | Admitting: Cardiology

## 2021-10-11 DIAGNOSIS — Z951 Presence of aortocoronary bypass graft: Secondary | ICD-10-CM

## 2021-10-11 NOTE — Progress Notes (Signed)
Daily Session Note ? ?Patient Details  ?Name: Lauren David ?MRN: [id number] ?Date of Birth: 31-Oct-1960 ?Referring Provider:   ?Flowsheet Row CARDIAC REHAB PHASE II ORIENTATION from 08/09/2021 in [place]  ?Referring Provider Dr. Kipp Brood  ? ?  ? ? ?Encounter Date: 10/11/2021 ? ?Check In: ? Session Check In - 10/11/21 1056   ? ?  ? Check-In  ? Supervising physician immediately available to respond to emergencies [facility] MD immediately available   ? Physician(s) Dr. Domenic Polite   ? Location [facility]   ? Staff Present Hoy Register, MS, ACSM-CEP, Exercise Physiologist;Gurvir Schrom Otho Ket, BS, Exercise Physiologist;Debra Wynetta Emery, RN, BSN   ? Virtual Visit No   ? Medication changes reported     No   ? Fall or balance concerns reported    Yes   ? Comments She frequently loses her balance but does not fall due to her knees and hips giving out.   ? Tobacco Cessation No Change   ? Warm-up and Cool-down Performed as group-led instruction   ? Resistance Training Performed Yes   ? VAD Patient? No   ? PAD/SET Patient? No   ?  ? Pain Assessment  ? Currently in Pain? No/denies   ? Pain Score 0-No pain   ? Multiple Pain Sites No   ? ?  ?  ? ?  ? ? ?Capillary Blood Glucose: ?No results found for this or any previous visit (from the past 24 hour(s)). ? ? ? ?Social History  ? ?Tobacco Use  ?Smoking Status Former  ? Packs/day: 1.00  ? Types: Cigarettes  ?Smokeless Tobacco Never  ?Tobacco Comments  ? cut back to 1 pack a day. Smokig x 27yr. Did smoke 2 packs x 5 yrs, but cut back a month ago.   ? ? ?Goals Met:  ?Independence with exercise equipment ?Exercise tolerated well ?No report of concerns or symptoms today ?Strength training completed today ? ?Goals Unmet:  ?Not Applicable ? ?Comments: check out 1200 ? ? ?Dr. JCarlyle Dollyis Medical Director for [place]?

## 2021-10-14 ENCOUNTER — Encounter (HOSPITAL_COMMUNITY): Payer: Medicare Other

## 2021-10-16 ENCOUNTER — Other Ambulatory Visit: Payer: Self-pay | Admitting: Internal Medicine

## 2021-10-16 ENCOUNTER — Encounter (HOSPITAL_COMMUNITY)
Admission: RE | Admit: 2021-10-16 | Discharge: 2021-10-16 | Disposition: A | Discharge status: Home / Self Care | Payer: Medicare Other | Source: Ambulatory Visit | Attending: Cardiology | Admitting: Cardiology

## 2021-10-16 DIAGNOSIS — Z951 Presence of aortocoronary bypass graft: Secondary | ICD-10-CM

## 2021-10-16 DIAGNOSIS — E119 Type 2 diabetes mellitus without complications: Secondary | ICD-10-CM

## 2021-10-16 NOTE — Progress Notes (Signed)
Daily Session Note ? ?Patient Details  ?Name: Lauren David ?MRN: [id number] ?Date of Birth: Sep 19, 1960 ?Referring Provider:   ?Flowsheet Row CARDIAC REHAB PHASE II ORIENTATION from 08/09/2021 in [place]  ?Referring Provider Dr. Kipp Brood  ? ?  ? ? ?Encounter Date: 10/16/2021 ? ?Check In: ? Session Check In - 10/16/21 1056   ? ?  ? Check-In  ? Supervising physician immediately available to respond to emergencies [facility] MD immediately available   ? Physician(s) Dr Harl Bowie   ? Location [facility]   ? Staff Present Hoy Register, MS, ACSM-CEP, Exercise Physiologist;Heather Otho Ket, BS, Exercise Physiologist;Debra Wynetta Emery, RN, Joanette Gula, RN, BSN   ? Virtual Visit No   ? Medication changes reported     No   ? Fall or balance concerns reported    Yes   ? Comments She frequently loses her balance but does not fall due to her knees and hips giving out.   ? Tobacco Cessation No Change   ? Warm-up and Cool-down Performed as group-led instruction   ? Resistance Training Performed Yes   ? VAD Patient? No   ? PAD/SET Patient? No   ?  ? Pain Assessment  ? Currently in Pain? No/denies   ? Pain Score 0-No pain   ? Multiple Pain Sites No   ? ?  ?  ? ?  ? ? ?Capillary Blood Glucose: ?No results found for this or any previous visit (from the past 24 hour(s)). ? ? ? ?Social History  ? ?Tobacco Use  ?Smoking Status Former  ? Packs/day: 1.00  ? Types: Cigarettes  ?Smokeless Tobacco Never  ?Tobacco Comments  ? cut back to 1 pack a day. Smokig x 21yr. Did smoke 2 packs x 5 yrs, but cut back a month ago.   ? ? ?Goals Met:  ?Independence with exercise equipment ?Exercise tolerated well ?No report of concerns or symptoms today ?Strength training completed today ? ?Goals Unmet:  ?Not Applicable ? ?Comments: Check out 1200 ? ? ?Dr. JCarlyle Dollyis Medical Director for [place]?

## 2021-10-17 ENCOUNTER — Telehealth: Payer: Self-pay | Admitting: Internal Medicine

## 2021-10-17 NOTE — Telephone Encounter (Signed)
Patient called in for DRS results  ?

## 2021-10-17 NOTE — Telephone Encounter (Signed)
Pt advised results have not came back yet. Will call when results are back  ?

## 2021-10-18 ENCOUNTER — Encounter (HOSPITAL_COMMUNITY)
Admission: RE | Admit: 2021-10-18 | Discharge: 2021-10-18 | Disposition: A | Discharge status: Home / Self Care | Payer: Medicare Other | Source: Ambulatory Visit | Attending: Cardiology | Admitting: Cardiology

## 2021-10-18 DIAGNOSIS — Z951 Presence of aortocoronary bypass graft: Secondary | ICD-10-CM

## 2021-10-18 NOTE — Progress Notes (Signed)
Daily Session Note ? ?Patient Details  ?Name: Lauren David ?MRN: [id number] ?Date of Birth: 09/09/1960 ?Referring Provider:   ?Flowsheet Row CARDIAC REHAB PHASE II ORIENTATION from 08/09/2021 in [place]  ?Referring Provider Dr. Kipp Brood  ? ?  ? ? ?Encounter Date: 10/18/2021 ? ?Check In: ? Session Check In - 10/18/21 1045   ? ?  ? Check-In  ? Supervising physician immediately available to respond to emergencies [facility] MD immediately available   ? Physician(s) Dr Harl Bowie   ? Location [facility]   ? Staff Present Geanie Cooley, RN;Debra Wynetta Emery, RN, Joanette Gula, RN, Madlyn Frankel, RN, BSN   ? Virtual Visit No   ? Medication changes reported     No   ? Fall or balance concerns reported    Yes   ? Comments She frequently loses her balance but does not fall due to her knees and hips giving out.   ? Tobacco Cessation No Change   ? Warm-up and Cool-down Performed as group-led instruction   ? Resistance Training Performed Yes   ? VAD Patient? No   ? PAD/SET Patient? No   ?  ? Pain Assessment  ? Currently in Pain? No/denies   ? Pain Score 0-No pain   ? Multiple Pain Sites No   ? ?  ?  ? ?  ? ? ?Capillary Blood Glucose: ?No results found for this or any previous visit (from the past 24 hour(s)). ? ? ? ?Social History  ? ?Tobacco Use  ?Smoking Status Former  ? Packs/day: 1.00  ? Types: Cigarettes  ?Smokeless Tobacco Never  ?Tobacco Comments  ? cut back to 1 pack a day. Smokig x 28yr. Did smoke 2 packs x 5 yrs, but cut back a month ago.   ? ? ?Goals Met:  ?Independence with exercise equipment ?Exercise tolerated well ?No report of concerns or symptoms today ?Strength training completed today ? ?Goals Unmet:  ?Not Applicable ? ?Comments: checkout 1200 ? ? ? ?Dr. JCarlyle Dollyis Medical Director for [facility]?

## 2021-10-21 ENCOUNTER — Encounter (HOSPITAL_COMMUNITY)
Admission: RE | Admit: 2021-10-21 | Discharge: 2021-10-21 | Disposition: A | Discharge status: Home / Self Care | Payer: Medicare Other | Source: Ambulatory Visit | Attending: Cardiology | Admitting: Cardiology

## 2021-10-21 DIAGNOSIS — Z951 Presence of aortocoronary bypass graft: Secondary | ICD-10-CM

## 2021-10-21 IMAGING — MG MM DIGITAL DIAGNOSTIC UNILAT*L* W/ TOMO W/ CAD
4 series · 4 of 8 positions shown · non-contrast
Comparison: Previous exams.

CLINICAL DATA: Screening recall for left breast calcifications.
History of benign stereotactic guided biopsy of calcifications in
the left breast 0907.

EXAM:
DIGITAL DIAGNOSTIC UNILATERAL LEFT MAMMOGRAM WITH TOMOSYNTHESIS AND
CAD
TECHNIQUE: Left digital diagnostic mammography and breast tomosynthesis was
performed. The images were evaluated with computer-aided detection.

[L CC]
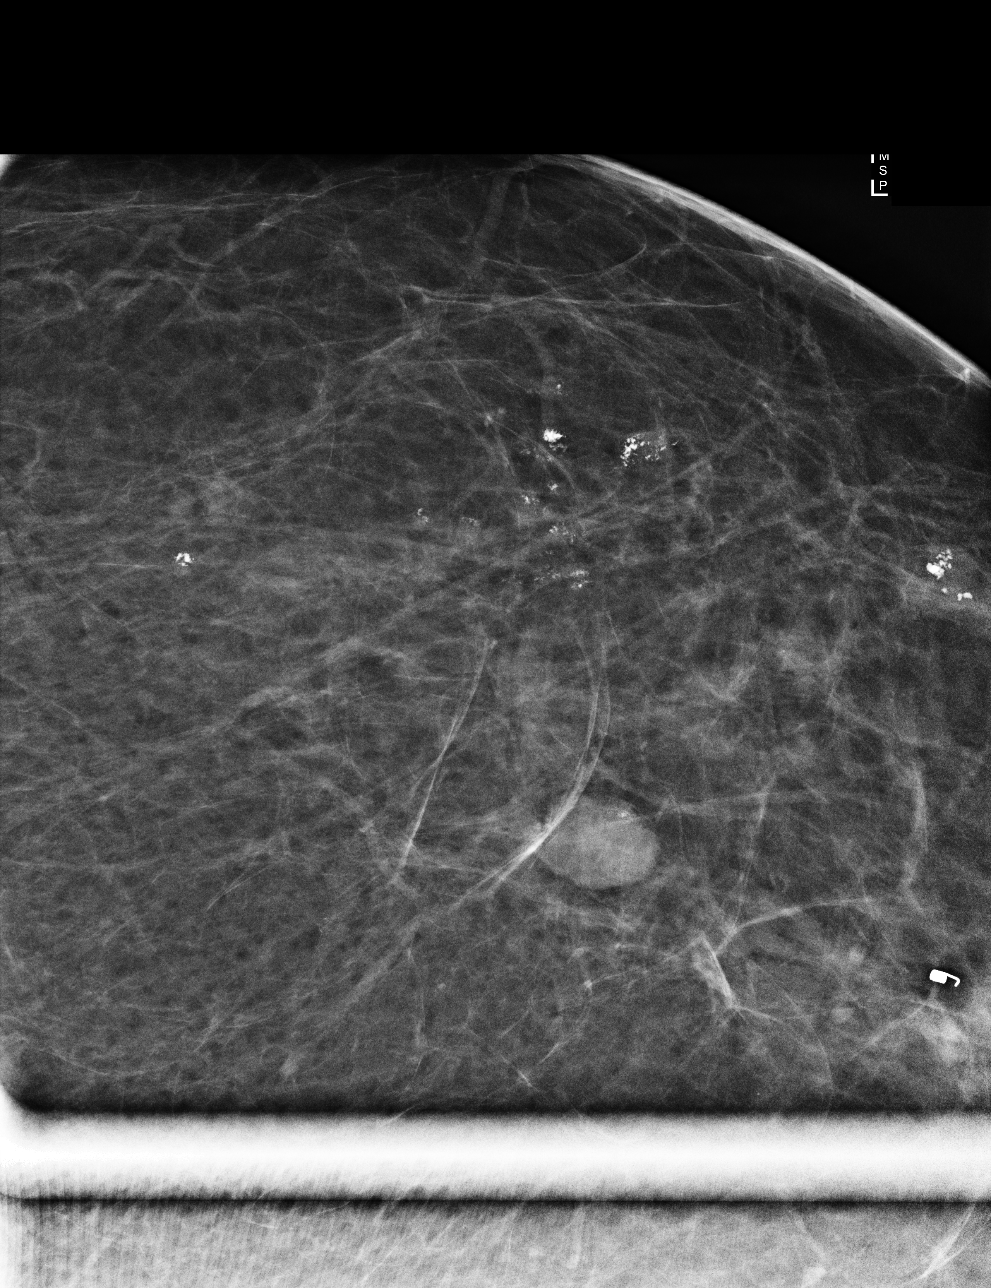

[L ML]
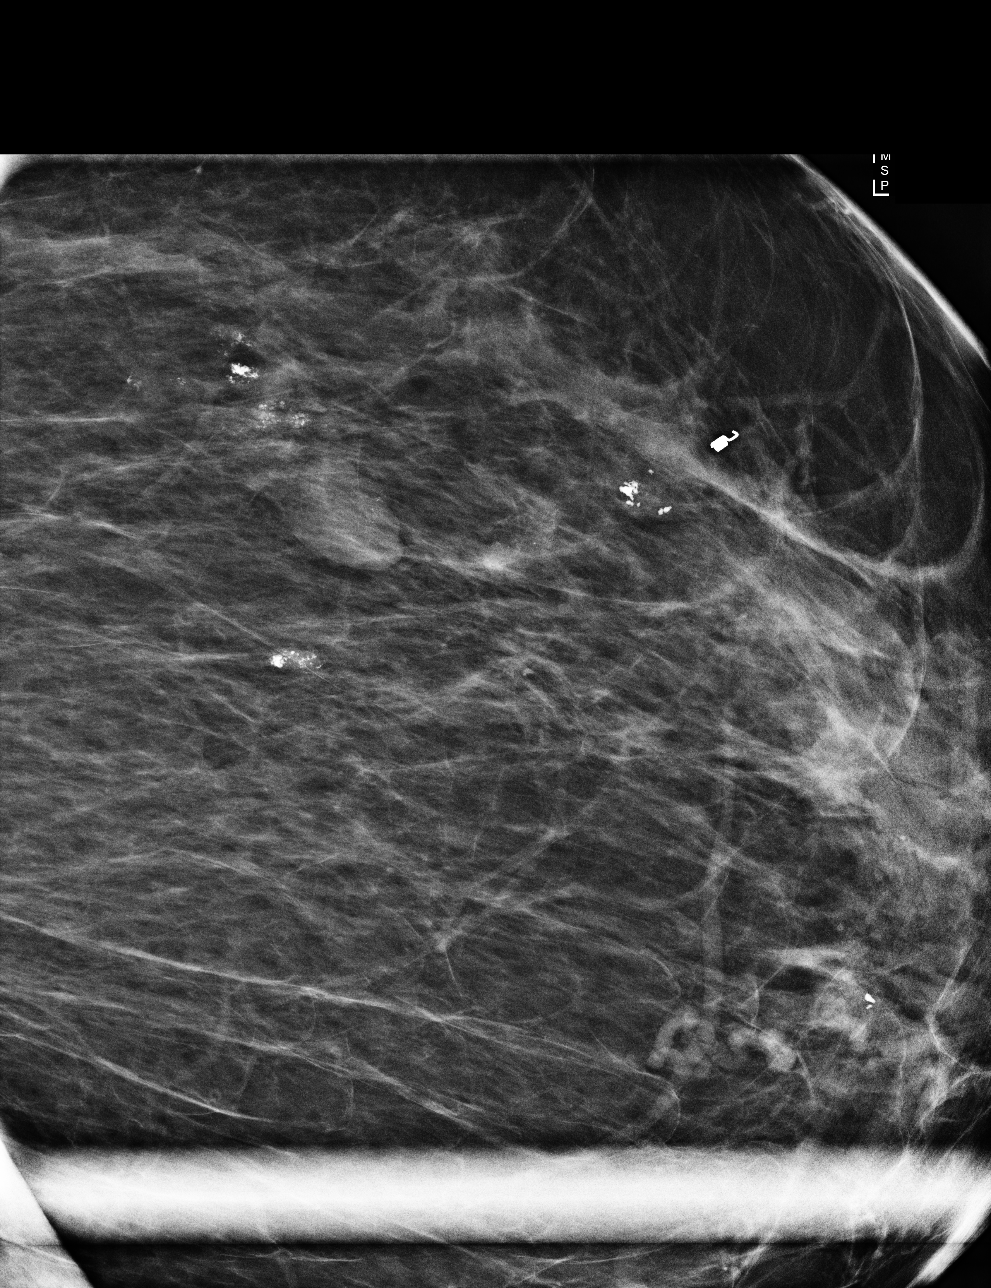

[L ML synth-2D]
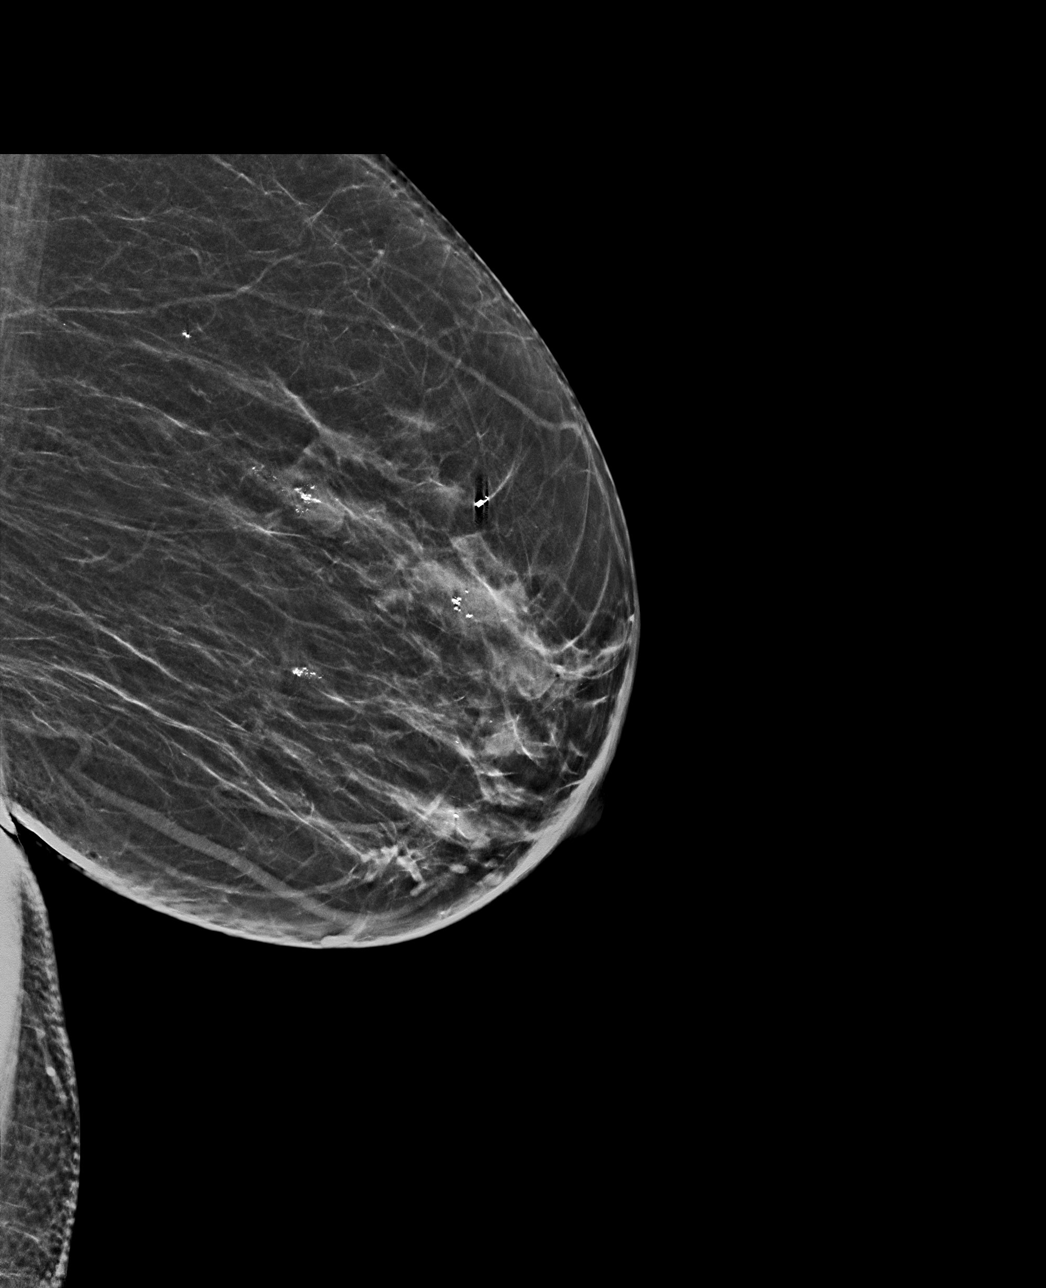

[L ML tomo · tomo slice 29/57.0]
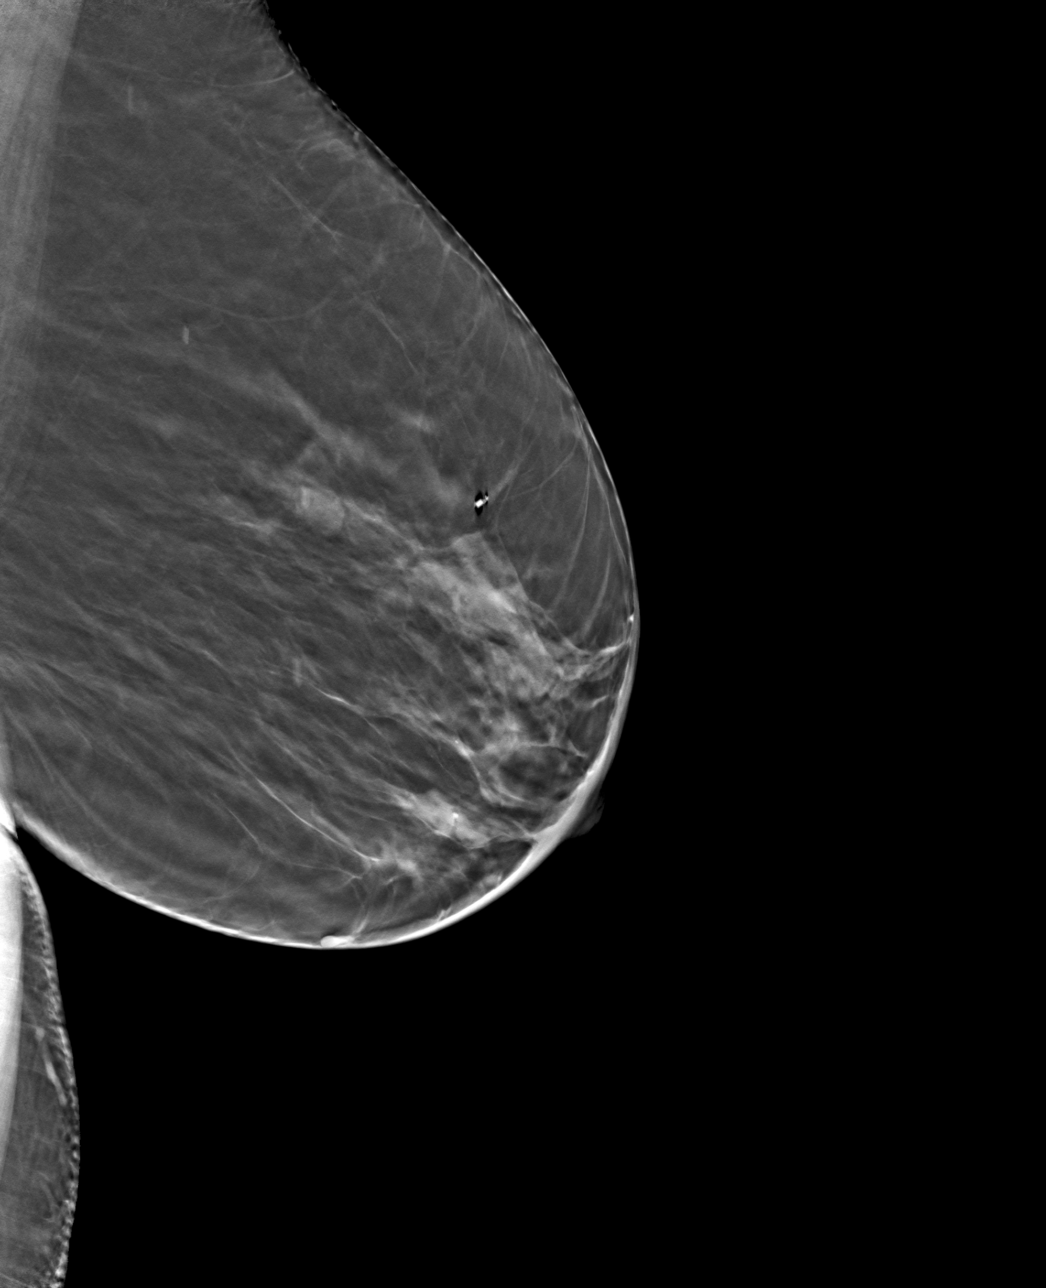

[4 of 8 positions shown; findings below may reference images not displayed]

ACR Breast Density Category b: There are scattered areas of
fibroglandular density.
FINDINGS: Spot compression magnification views were performed of the
upper-outer left breast demonstrating loosely grouped coarse likely
early dystrophic type calcifications. A coil shaped biopsy marking
clip is present slightly medial to these calcifications at site of
prior benign biopsy of calcifications in 0907.
IMPRESSION: Probably benign left breast calcifications.

RECOMMENDATION:
Diagnostic mammography with magnification views of the left breast
in 6 months.

I have discussed the findings and recommendations with the patient.
If applicable, a reminder letter will be sent to the patient
regarding the next appointment.

BI-RADS CATEGORY  3: Probably benign.

## 2021-10-21 NOTE — Progress Notes (Signed)
Daily Session Note ? ?Patient Details  ?Name: Lauren David ?MRN: [id number] ?Date of Birth: Jun 08, 1961 ?Referring Provider:   ?Flowsheet Row CARDIAC REHAB PHASE II ORIENTATION from 08/09/2021 in [place]  ?Referring Provider Dr. Kipp Brood  ? ?  ? ? ?Encounter Date: 10/21/2021 ? ?Check In: ? Session Check In - 10/21/21 1057   ? ?  ? Check-In  ? Supervising physician immediately available to respond to emergencies [facility] MD immediately available   ? Physician(s) Dr Domenic Polite   ? Location [facility]   ? Staff Present Redge Gainer, BS, Exercise Physiologist;Debra Wynetta Emery, RN, Joanette Gula, RN, Madlyn Frankel, RN, BSN   ? Virtual Visit No   ? Medication changes reported     No   ? Fall or balance concerns reported    Yes   ? Comments She frequently loses her balance but does not fall due to her knees and hips giving out.   ? Tobacco Cessation No Change   ? Warm-up and Cool-down Performed as group-led instruction   ? Resistance Training Performed Yes   ? VAD Patient? No   ? PAD/SET Patient? No   ?  ? Pain Assessment  ? Currently in Pain? No/denies   ? Pain Score 0-No pain   ? ?  ?  ? ?  ? ? ?Capillary Blood Glucose: ?No results found for this or any previous visit (from the past 24 hour(s)). ? ? ? ?Social History  ? ?Tobacco Use  ?Smoking Status Former  ? Packs/day: 1.00  ? Types: Cigarettes  ?Smokeless Tobacco Never  ?Tobacco Comments  ? cut back to 1 pack a day. Smokig x 30yr. Did smoke 2 packs x 5 yrs, but cut back a month ago.   ? ? ?Goals Met:  ?Independence with exercise equipment ?Exercise tolerated well ?No report of concerns or symptoms today ?Strength training completed today ? ?Goals Unmet:  ?Not Applicable ? ?Comments: checkout 1200 ? ? ? ?Dr. JCarlyle Dollyis Medical Director for [facility]?

## 2021-10-23 ENCOUNTER — Encounter (HOSPITAL_COMMUNITY)
Admission: RE | Admit: 2021-10-23 | Discharge: 2021-10-23 | Disposition: A | Discharge status: Home / Self Care | Payer: Medicare Other | Source: Ambulatory Visit | Attending: Cardiology | Admitting: Cardiology

## 2021-10-23 DIAGNOSIS — Z951 Presence of aortocoronary bypass graft: Secondary | ICD-10-CM | POA: Diagnosis not present

## 2021-10-23 NOTE — Progress Notes (Signed)
Daily Session Note ? ?Patient Details  ?Name: Lauren David ?MRN: [id number] ?Date of Birth: [birth year] ?Referring Provider:   ?Flowsheet Row CARDIAC REHAB PHASE II ORIENTATION from 08/09/2021 in [place]  ?Referring Provider Dr. Kipp Brood  ? ?  ? ? ?Encounter Date: 10/23/2021 ? ?Check In: ? Session Check In - 10/23/21 1100   ? ?  ? Check-In  ? Supervising physician immediately available to respond to emergencies [facility] MD immediately available   ? Physician(s) Dr. Domenic Polite   ? Location [facility]   ? Staff Present Geanie Cooley, RN;Dalton Kris Mouton, MS, ACSM-CEP, Exercise Physiologist;Heather Zigmund Daniel, Exercise Physiologist   ? Virtual Visit No   ? Medication changes reported     No   ? Fall or balance concerns reported    Yes   ? Comments She frequently loses her balance but does not fall due to her knees and hips giving out.   ? Tobacco Cessation No Change   ? Warm-up and Cool-down Performed as group-led instruction   ? Resistance Training Performed Yes   ? VAD Patient? No   ? PAD/SET Patient? No   ?  ? Pain Assessment  ? Currently in Pain? No/denies   ? Pain Score 0-No pain   ? Multiple Pain Sites No   ? ?  ?  ? ?  ? ? ?Capillary Blood Glucose: ?No results found for this or any previous visit (from the past 24 hour(s)). ? ? ? ?Social History  ? ?Tobacco Use  ?Smoking Status Former  ? Packs/day: 1.00  ? Types: Cigarettes  ?Smokeless Tobacco Never  ?Tobacco Comments  ? cut back to 1 pack a day. Smokig x 85yr. Did smoke 2 packs x 5 yrs, but cut back a month ago.   ? ? ?Goals Met:  ?Independence with exercise equipment ?Exercise tolerated well ?No report of concerns or symptoms today ?Strength training completed today ? ?Goals Unmet:  ?Not Applicable ? ?Comments: check out @ 12:00pm ? ? ?Dr. JCarlyle Dollyis Medical Director for [place]?

## 2021-10-25 ENCOUNTER — Encounter (HOSPITAL_COMMUNITY)
Admission: RE | Admit: 2021-10-25 | Discharge: 2021-10-25 | Disposition: A | Discharge status: Home / Self Care | Payer: Medicare Other | Source: Ambulatory Visit | Attending: Cardiology | Admitting: Cardiology

## 2021-10-25 DIAGNOSIS — Z951 Presence of aortocoronary bypass graft: Secondary | ICD-10-CM | POA: Diagnosis not present

## 2021-10-25 NOTE — Progress Notes (Signed)
Daily Session Note ? ?Patient Details  ?Name: Lauren David ?MRN: [id number] ?Date of Birth: [birth year] ?Referring Provider:   ?Flowsheet Row CARDIAC REHAB PHASE II ORIENTATION from 08/09/2021 in [place]  ?Referring Provider Dr. Kipp Brood  ? ?  ? ? ?Encounter Date: 10/25/2021 ? ?Check In: ? Session Check In - 10/25/21 1057   ? ?  ? Check-In  ? Supervising physician immediately available to respond to emergencies [facility] MD immediately available   ? Physician(s) Dr Gardiner Rhyme   ? Location [facility]   ? Staff Present Redge Gainer, BS, Exercise Physiologist;Latrica Clowers Hassell Done, RN, BSN;Christy Edwards, RN, BSN   ? Virtual Visit No   ? Medication changes reported     No   ? Fall or balance concerns reported    Yes   ? Comments She frequently loses her balance but does not fall due to her knees and hips giving out.   ? Tobacco Cessation No Change   ? Warm-up and Cool-down Performed as group-led instruction   ? Resistance Training Performed Yes   ? VAD Patient? No   ? PAD/SET Patient? No   ?  ? Pain Assessment  ? Currently in Pain? Yes   ? Pain Score 0-No pain   ? Multiple Pain Sites No   ? ?  ?  ? ?  ? ? ?Capillary Blood Glucose: ?No results found for this or any previous visit (from the past 24 hour(s)). ? ? ? ?Social History  ? ?Tobacco Use  ?Smoking Status Former  ? Packs/day: 1.00  ? Types: Cigarettes  ?Smokeless Tobacco Never  ?Tobacco Comments  ? cut back to 1 pack a day. Smokig x 71yr. Did smoke 2 packs x 5 yrs, but cut back a month ago.   ? ? ?Goals Met:  ?Independence with exercise equipment ?Exercise tolerated well ?No report of concerns or symptoms today ?Strength training completed today ? ?Goals Unmet:  ?Not Applicable ? ?Comments: Checkout 1200. ? ? ?Dr. JCarlyle Dollyis Medical Director for [facility]?

## 2021-10-28 ENCOUNTER — Encounter (HOSPITAL_COMMUNITY)
Admission: RE | Admit: 2021-10-28 | Discharge: 2021-10-28 | Disposition: A | Discharge status: Home / Self Care | Payer: Medicare Other | Source: Ambulatory Visit | Attending: Cardiology | Admitting: Cardiology

## 2021-10-28 DIAGNOSIS — Z951 Presence of aortocoronary bypass graft: Secondary | ICD-10-CM | POA: Diagnosis not present

## 2021-10-28 NOTE — Progress Notes (Signed)
Daily Session Note ? ?Patient Details  ?Name: Lauren David ?MRN: [id number] ?Date of Birth: 10/04/1960 ?Referring Provider:   ?Flowsheet Row CARDIAC REHAB PHASE II ORIENTATION from 08/09/2021 in [name]  ?Referring Provider Dr. Kipp Brood  ? ?  ? ? ?Encounter Date: 10/28/2021 ? ?Check In: ? Session Check In - 10/28/21 1100   ? ?  ? Check-In  ? Supervising physician immediately available to respond to emergencies [facility] MD immediately available   ? Physician(s) Dr Marlou Porch   ? Staff Present Lauren Flavors, RN, Lauren Loser, MS, ACSM-CEP, Exercise Physiologist;Lauren David, Exercise Physiologist   ? Virtual Visit No   ? Medication changes reported     No   ? Fall or balance concerns reported    Yes   ? Comments She frequently loses her balance but does not fall due to her knees and hips giving out.   ? Tobacco Cessation No Change   ? Warm-up and Cool-down Performed as group-led instruction   ? Resistance Training Performed Yes   ? VAD Patient? No   ? PAD/SET Patient? No   ?  ? Pain Assessment  ? Currently in Pain? No/denies   ? Pain Score 0-No pain   ? Multiple Pain Sites No   ? ?  ?  ? ?  ? ? ?Capillary Blood Glucose: ?No results found for this or any previous visit (from the past 24 hour(s)). ? ? ? ?Social History  ? ?Tobacco Use  ?Smoking Status Former  ? Packs/day: 1.00  ? Types: Cigarettes  ?Smokeless Tobacco Never  ?Tobacco Comments  ? cut back to 1 pack a day. Smokig x 70yr. Did smoke 2 packs x 5 yrs, but cut back a month ago.   ? ? ?Goals Met:  ?Independence with exercise equipment ?Exercise tolerated well ?No report of concerns or symptoms today ?Strength training completed today ? ?Goals Unmet:  ?Not Applicable ? ?Comments: Check out 1200 ? ? ?Dr. JCarlyle Dollyis Medical Director for [place]?

## 2021-10-29 ENCOUNTER — Telehealth: Payer: Self-pay | Admitting: Internal Medicine

## 2021-10-29 NOTE — Telephone Encounter (Signed)
Patient called back in regard to results from [facility] in FEB  ?

## 2021-10-30 ENCOUNTER — Encounter (HOSPITAL_COMMUNITY)
Admission: RE | Admit: 2021-10-30 | Discharge: 2021-10-30 | Disposition: A | Discharge status: Home / Self Care | Payer: Medicare Other | Source: Ambulatory Visit | Attending: Cardiology | Admitting: Cardiology

## 2021-10-30 VITALS — Ht 61.0 in | Wt 173.5 lb

## 2021-10-30 DIAGNOSIS — Z951 Presence of aortocoronary bypass graft: Secondary | ICD-10-CM | POA: Diagnosis not present

## 2021-10-30 NOTE — Progress Notes (Signed)
Daily Session Note ? ?Patient Details  ?Name: Lauren David ?MRN: [id number] ?Date of Birth: 1961/05/26 ?Referring Provider:   ?Flowsheet Row CARDIAC REHAB PHASE II ORIENTATION from 08/09/2021 in [place]  ?Referring Provider Dr. Kipp Brood  ? ?  ? ? ?Encounter Date: 10/30/2021 ? ?Check In: ? Session Check In - 10/30/21 1054   ? ?  ? Check-In  ? Supervising physician immediately available to respond to emergencies [facility] MD immediately available   ? Physician(s) Dr Harl Bowie   ? Location [facility]   ? Staff Present Jilda Roche, RN, BSN;Heather Otho Ket, BS, Exercise Physiologist;Dalton Kris Mouton, MS, ACSM-CEP, Exercise Physiologist;Carletta Feasel Hassell Done, RN, BSN   ? Virtual Visit No   ? Medication changes reported     No   ? Fall or balance concerns reported    Yes   ? Comments She frequently loses her balance but does not fall due to her knees and hips giving out.   ? Tobacco Cessation No Change   ? Warm-up and Cool-down Performed as group-led instruction   ? Resistance Training Performed Yes   ? VAD Patient? No   ? PAD/SET Patient? No   ?  ? Pain Assessment  ? Currently in Pain? No/denies   ? Pain Score 0-No pain   ? Multiple Pain Sites No   ? ?  ?  ? ?  ? ? ?Capillary Blood Glucose: ?No results found for this or any previous visit (from the past 24 hour(s)). ? ? ? ?Social History  ? ?Tobacco Use  ?Smoking Status Former  ? Packs/day: 1.00  ? Types: Cigarettes  ?Smokeless Tobacco Never  ?Tobacco Comments  ? cut back to 1 pack a day. Smokig x 19yr. Did smoke 2 packs x 5 yrs, but cut back a month ago.   ? ? ?Goals Met:  ?Independence with exercise equipment ?Exercise tolerated well ?No report of concerns or symptoms today ?Strength training completed today ? ?Goals Unmet:  ?Not Applicable ? ?Comments: Checkout at 1200 ? ? ?Dr. JCarlyle Dollyis Medical Director for [place]?

## 2021-11-01 ENCOUNTER — Encounter (HOSPITAL_COMMUNITY)
Admission: RE | Admit: 2021-11-01 | Discharge: 2021-11-01 | Disposition: A | Discharge status: Home / Self Care | Payer: Medicare Other | Source: Ambulatory Visit | Attending: Cardiology | Admitting: Cardiology

## 2021-11-01 DIAGNOSIS — Z951 Presence of aortocoronary bypass graft: Secondary | ICD-10-CM

## 2021-11-01 NOTE — Progress Notes (Signed)
Daily Session Note ? ?Patient Details  ?Name: Lauren David ?MRN: [id number] ?Date of Birth: Dec 27, 1960 ?Referring Provider:   ?Flowsheet Row CARDIAC REHAB PHASE II ORIENTATION from 08/09/2021 in [place]  ?Referring Provider Dr. Kipp Brood  ? ?  ? ? ?Encounter Date: 11/01/2021 ? ?Check In: ? Session Check In - 11/01/21 1056   ? ?  ? Check-In  ? Supervising physician immediately available to respond to emergencies [facility] MD immediately available   ? Physician(s) Dr Harl Bowie   ? Location [facility]   ? Staff Present Aundra Dubin, RN, Joanette Gula, RN, Bjorn Loser, MS, ACSM-CEP, Exercise Physiologist;Christy Oletta Lamas, RN, BSN   ? Virtual Visit No   ? Medication changes reported     No   ? Fall or balance concerns reported    Yes   ? Comments She frequently loses her balance but does not fall due to her knees and hips giving out.   ? Tobacco Cessation No Change   ? Warm-up and Cool-down Performed as group-led instruction   ? Resistance Training Performed Yes   ? VAD Patient? No   ? PAD/SET Patient? No   ?  ? Pain Assessment  ? Currently in Pain? No/denies   ? Pain Score 0-No pain   ? Multiple Pain Sites No   ? ?  ?  ? ?  ? ? ?Capillary Blood Glucose: ?No results found for this or any previous visit (from the past 24 hour(s)). ? ? ? ?Social History  ? ?Tobacco Use  ?Smoking Status Former  ? Packs/day: 1.00  ? Types: Cigarettes  ?Smokeless Tobacco Never  ?Tobacco Comments  ? cut back to 1 pack a day. Smokig x 10yr. Did smoke 2 packs x 5 yrs, but cut back a month ago.   ? ? ?Goals Met:  ?Independence with exercise equipment ?Exercise tolerated well ?No report of concerns or symptoms today ?Strength training completed today ? ?Goals Unmet:  ?Not Applicable ? ?Comments: check out at 1200. ? ? ?Dr. JCarlyle Dollyis Medical Director for [facility]?

## 2021-11-05 NOTE — Progress Notes (Signed)
Discharge Progress Report ? ?Patient Details  ?Name: Lauren David ?MRN: [id number] ?Date of Birth: Aug 28, 1960 ?Referring Provider:   ?Flowsheet Row CARDIAC REHAB PHASE II ORIENTATION from 08/09/2021 in [place]  ?Referring Provider Dr. Kipp Brood  ? ?  ? ? ? ?Number of Visits: 40 ? ?Reason for Discharge:  ?Patient reached a stable level of exercise. ?Patient independent in their exercise. ?Patient has met program and personal goals. ? ?Smoking History:  ?Social History  ? ?Tobacco Use  ?Smoking Status Former  ? Packs/day: 1.00  ? Types: Cigarettes  ?Smokeless Tobacco Never  ?Tobacco Comments  ? cut back to 1 pack a day. Smokig x 90yrs. Did smoke 2 packs x 5 yrs, but cut back a month ago.   ? ? ?Diagnosis:  ?S/P CABG x 4 ? ?ADL UCSD: ? ? ?Initial Exercise Prescription: ? Initial Exercise Prescription - 08/09/21 1400   ? ?  ? Date of Initial Exercise RX and Referring Provider  ? Date 08/09/21   ? Referring Provider Dr. Kipp Brood   ? Expected Discharge Date 11/01/21   ?  ? Treadmill  ? MPH 1   ? Grade 0   ? Minutes 17   ?  ? NuStep  ? Level 1   ? SPM 80   ? Minutes 22   ?  ? Prescription Details  ? Frequency (times per week) 3   ? Duration Progress to 30 minutes of continuous aerobic without signs/symptoms of physical distress   ?  ? Intensity  ? THRR 40-80% of Max Heartrate 63-126   ? Ratings of Perceived Exertion 11-13   ? Perceived Dyspnea 0-4   ?  ? Resistance Training  ? Training Prescription Yes   ? Weight 2 lbs   ? Reps 10-15   ? ?  ?  ? ?  ? ? ?Discharge Exercise Prescription (Final Exercise Prescription Changes): ? Exercise Prescription Changes - 10/11/21 1151   ? ?  ? Response to Exercise  ? Blood Pressure (Admit) 108/56   ? Blood Pressure (Exercise) 128/60   ? Blood Pressure (Exit) 102/58   ? Heart Rate (Admit) 75 bpm   ? Heart Rate (Exercise) 80 bpm   ? Heart Rate (Exit) 80 bpm   ? Rating of Perceived Exertion (Exercise) 11   ? Duration Continue with 30 min of aerobic exercise without  signs/symptoms of physical distress.   ? Intensity THRR unchanged   ?  ? Progression  ? Progression Continue to progress workloads to maintain intensity without signs/symptoms of physical distress.   ?  ? Resistance Training  ? Training Prescription Yes   ? Weight 2   ? Reps 10-15   ? Time 10 Minutes   ?  ? Treadmill  ? MPH 1.6   ? Grade 0   ? Minutes 17   ? METs 2.23   ?  ? NuStep  ? Level 3   ? SPM 89   ? Minutes 22   ? METs 2.02   ? ?  ?  ? ?  ? ? ?Functional Capacity: ? 6 Minute Walk   ? ? [place] Name 08/09/21 1457 10/30/21 1129  ?  ?  ? 6 Minute Walk  ? Phase Initial Discharge   ? Distance 1150 feet 1400 feet   ? Distance Feet Change -- 250 ft   ? Walk Time 6 minutes 6 minutes   ? # of Rest Breaks 0 0   ? MPH 2.18 2.65   ?  METS 2.41 2.99   ? RPE 13 12   ? VO2 Peak 8.43 10.48   ? Symptoms Yes (comment) No   ? Comments R hip pain 3/10 --   ? Resting HR 70 bpm 68 bpm   ? Resting BP 102/56 112/58   ? Resting Oxygen Saturation  98 % 98 %   ? Exercise Oxygen Saturation  during 6 min walk 98 % 97 %   ? Max Ex. HR 71 bpm 88 bpm   ? Max Ex. BP 118/66 130/60   ? 2 Minute Post BP 104/58 110/60   ? ?  ?  ? ?  ? ? ?Psychological, QOL, Others - Outcomes: ?PHQ 2/9: ? ?  11/05/2021  ?  1:38 PM 09/17/2021  ?  1:24 PM 08/09/2021  ?  1:25 PM 06/06/2021  ?  2:54 PM 03/11/2021  ? 11:00 AM  ?Depression screen PHQ 2/9  ?Decreased Interest 0 0 0 0 0  ?Down, Depressed, Hopeless 0 0 0 0 0  ?PHQ - 2 Score 0 0 0 0 0  ?Altered sleeping 1  2    ?Tired, decreased energy 0  1    ?Change in appetite 0  1    ?Feeling bad or failure about yourself  0  0    ?Trouble concentrating 0  0    ?Moving slowly or fidgety/restless 0  0    ?Suicidal thoughts 0  0    ?PHQ-9 Score 1  4    ?Difficult doing work/chores Not difficult at all  Not difficult at all    ? ? ?Quality of Life: ? Quality of Life - 10/30/21 1138   ? ?  ? Quality of Life Scores  ? Health/Function Pre 24.43 %   ? Health/Function Post 27.43 %   ? Health/Function % Change 12.28 %   ? Socioeconomic Pre  29.14 %   ? Socioeconomic Post 27 %   ? Socioeconomic % Change  -7.34 %   ? Psych/Spiritual Pre 27.36 %   ? Psych/Spiritual Post 30 %   ? Psych/Spiritual % Change 9.65 %   ? Family Pre 27 %   ? Family Post 30 %   ? Family % Change 11.11 %   ? GLOBAL Pre 26.42 %   ? GLOBAL Post 28.2 %   ? GLOBAL % Change 6.74 %   ? ?  ?  ? ?  ? ? ?Personal Goals: ?Goals established at orientation with interventions provided to work toward goal. ? Personal Goals and Risk Factors at Admission - 08/09/21 1335   ? ?  ? Core Components/Risk Factors/Patient Goals on Admission  ?  Weight Management Yes;Weight Loss;Obesity   ? Intervention Weight Management: Develop a combined nutrition and exercise program designed to reach desired caloric intake, while maintaining appropriate intake of nutrient and fiber, sodium and fats, and appropriate energy expenditure required for the weight goal.;Weight Management: Provide education and appropriate resources to help participant work on and attain dietary goals.;Weight Management/Obesity: Establish reasonable short term and long term weight goals.;Obesity: Provide education and appropriate resources to help participant work on and attain dietary goals.   ? Expected Outcomes Short Term: Continue to assess and modify interventions until short term weight is achieved;Long Term: Adherence to nutrition and physical activity/exercise program aimed toward attainment of established weight goal;Weight Maintenance: Understanding of the daily nutrition guidelines, which includes 25-35% calories from fat, 7% or less cal from saturated fats, less than $RemoveB'200mg'VicZjmns$  cholesterol, less than 1.5gm  of sodium, & 5 or more servings of fruits and vegetables daily;Weight Loss: Understanding of general recommendations for a balanced deficit meal plan, which promotes 1-2 lb weight loss per week and includes a negative energy balance of [id number] kcal/d;Understanding recommendations for meals to include 15-35% energy as protein,  25-35% energy from fat, 35-60% energy from carbohydrates, less than 280m of dietary cholesterol, 20-35 gm of total fiber daily;Understanding of distribution of calorie intake throughout the day with the consumption of 4-5 meals/snacks   ? Improve shortness of breath with ADL's Yes   ? Intervention Provide education, individualized exercise plan and daily activity instruction to help decrease symptoms of SOB with activities of daily living.   ? Expected Outcomes Short Term: Improve cardiorespiratory fitness to achieve a reduction of symptoms when performing ADLs;Long Term: Be able to perform more ADLs without symptoms or delay the onset of symptoms   ? Diabetes Yes   ? Intervention Provide education about signs/symptoms and action to take for hypo/hyperglycemia.;Provide education about proper nutrition, including hydration, and aerobic/resistive exercise prescription along with prescribed medications to achieve blood glucose in normal ranges: Fasting glucose 65-99 mg/dL   ? Expected Outcomes Long Term: Attainment of HbA1C < 7%.;Short Term: Participant verbalizes understanding of the signs/symptoms and immediate care of hyper/hypoglycemia, proper foot care and importance of medication, aerobic/resistive exercise and nutrition plan for blood glucose control.   ? Personal Goal Other Yes   ? Personal Goal improve strength and mobility   ? Intervention Attend cardiac rehab and begin a home exercise program.   ? Expected Outcomes Pt will be able to improve their strength and stamina   ? ?  ?  ? ?  ?  ? ?Personal Goals Discharge: ? Goals and Risk Factor Review   ? ? R[place] FernandoName 08/26/21 0174002/20/23 1251 11/05/21 1342  ?  ?  ?  ? Core Components/Risk Factors/Patient Goals Review  ? Personal Goals Review Weight Management/Obesity;Diabetes;Other Weight Management/Obesity;Diabetes;Other Weight Management/Obesity;Diabetes;Other    ? Review Patient was referred to CR with CABGx4. She has multiple risk factors for CAD and is  participating in the program for risk modification. She has completed 7 sessions. Her initial weight was 167.0 lbs and her current weight is 165.2 lbs. Her blood pressure is well controlled. Her personal goals

## 2021-11-05 NOTE — Telephone Encounter (Signed)
Pt advised we would follow up with her once this is taken care of  ?

## 2021-11-18 ENCOUNTER — Other Ambulatory Visit: Payer: Self-pay | Admitting: Internal Medicine

## 2021-11-18 ENCOUNTER — Other Ambulatory Visit: Payer: Self-pay | Admitting: Cardiology

## 2021-11-18 DIAGNOSIS — E119 Type 2 diabetes mellitus without complications: Secondary | ICD-10-CM

## 2021-12-20 ENCOUNTER — Telehealth: Payer: Self-pay | Admitting: Internal Medicine

## 2021-12-20 ENCOUNTER — Other Ambulatory Visit: Payer: Self-pay | Admitting: *Deleted

## 2021-12-20 ENCOUNTER — Other Ambulatory Visit: Payer: Self-pay | Admitting: Physician Assistant

## 2021-12-20 ENCOUNTER — Other Ambulatory Visit: Payer: Self-pay | Admitting: Cardiology

## 2021-12-20 ENCOUNTER — Other Ambulatory Visit: Payer: Self-pay | Admitting: Internal Medicine

## 2021-12-20 DIAGNOSIS — I10 Essential (primary) hypertension: Secondary | ICD-10-CM

## 2021-12-20 DIAGNOSIS — Z720 Tobacco use: Secondary | ICD-10-CM

## 2021-12-20 DIAGNOSIS — E119 Type 2 diabetes mellitus without complications: Secondary | ICD-10-CM

## 2021-12-20 DIAGNOSIS — E1151 Type 2 diabetes mellitus with diabetic peripheral angiopathy without gangrene: Secondary | ICD-10-CM

## 2021-12-20 MED ORDER — VARENICLINE TARTRATE 1 MG PO TABS: 1.0000 mg | ORAL_TABLET | Freq: Two times a day (BID) | ORAL | 2 refills | Status: DC

## 2021-12-20 MED ORDER — SITAGLIPTIN PHOSPHATE 100 MG PO TABS: 100.0000 mg | ORAL_TABLET | Freq: Every day | ORAL | 0 refills | Status: DC

## 2021-12-20 MED ORDER — EMPAGLIFLOZIN 10 MG PO TABS: 10.0000 mg | ORAL_TABLET | Freq: Every day | ORAL | 2 refills | Status: DC

## 2021-12-20 NOTE — Telephone Encounter (Signed)
Pt referred to ophthalmology as last eye exam was insufficient medication sent to pharmacy

## 2021-12-20 NOTE — Telephone Encounter (Signed)
Pt called stating she called the phar this morning for med refills but has had an issues with meds not being filled so she asked if you could please refill JANUVIA '100MG'$ , JARDIANCE '10MG'$  & CHANTIX '1MG'$ ? She is needing these by Monday.   [facility] [place]    Pt is also asking to results of eye screening she has done here about 3 months ago. States her vision is getting worse.

## 2022-01-20 ENCOUNTER — Ambulatory Visit: Payer: Medicare Other | Admitting: Internal Medicine

## 2022-01-27 ENCOUNTER — Telehealth: Payer: Self-pay | Admitting: Internal Medicine

## 2022-01-27 ENCOUNTER — Ambulatory Visit (INDEPENDENT_AMBULATORY_CARE_PROVIDER_SITE_OTHER): Payer: Medicare Other | Admitting: Internal Medicine

## 2022-01-27 ENCOUNTER — Other Ambulatory Visit (HOSPITAL_COMMUNITY): Payer: Self-pay | Admitting: Internal Medicine

## 2022-01-27 ENCOUNTER — Encounter: Payer: Self-pay | Admitting: Internal Medicine

## 2022-01-27 ENCOUNTER — Other Ambulatory Visit: Payer: Self-pay | Admitting: *Deleted

## 2022-01-27 VITALS — BP 128/70 | HR 71 | Resp 18 | Ht 61.0 in | Wt 187.8 lb

## 2022-01-27 DIAGNOSIS — F17210 Nicotine dependence, cigarettes, uncomplicated: Secondary | ICD-10-CM

## 2022-01-27 DIAGNOSIS — J449 Chronic obstructive pulmonary disease, unspecified: Secondary | ICD-10-CM | POA: Diagnosis not present

## 2022-01-27 DIAGNOSIS — I10 Essential (primary) hypertension: Secondary | ICD-10-CM

## 2022-01-27 DIAGNOSIS — F5101 Primary insomnia: Secondary | ICD-10-CM

## 2022-01-27 DIAGNOSIS — E1149 Type 2 diabetes mellitus with other diabetic neurological complication: Secondary | ICD-10-CM | POA: Insufficient documentation

## 2022-01-27 DIAGNOSIS — R921 Mammographic calcification found on diagnostic imaging of breast: Secondary | ICD-10-CM

## 2022-01-27 DIAGNOSIS — Z1231 Encounter for screening mammogram for malignant neoplasm of breast: Secondary | ICD-10-CM

## 2022-01-27 DIAGNOSIS — Z951 Presence of aortocoronary bypass graft: Secondary | ICD-10-CM

## 2022-01-27 DIAGNOSIS — E782 Mixed hyperlipidemia: Secondary | ICD-10-CM

## 2022-01-27 DIAGNOSIS — E559 Vitamin D deficiency, unspecified: Secondary | ICD-10-CM

## 2022-01-27 DIAGNOSIS — Z720 Tobacco use: Secondary | ICD-10-CM

## 2022-01-27 DIAGNOSIS — E1151 Type 2 diabetes mellitus with diabetic peripheral angiopathy without gangrene: Secondary | ICD-10-CM | POA: Diagnosis not present

## 2022-01-27 LAB — POCT GLYCOSYLATED HEMOGLOBIN (HGB A1C)
HbA1c POC (<> result, manual entry): 6.9 % (ref 4.0–5.6)
HbA1c, POC (controlled diabetic range): 6.9 % (ref 0.0–7.0)

## 2022-01-27 MED ORDER — TRULICITY 1.5 MG/0.5ML ~~LOC~~ SOAJ: 1.5000 mg | SUBCUTANEOUS | 0 refills | Status: DC

## 2022-01-27 MED ORDER — TRULICITY 0.75 MG/0.5ML ~~LOC~~ SOAJ: 0.7500 mg | SUBCUTANEOUS | 0 refills | Status: DC

## 2022-01-27 MED ORDER — GABAPENTIN 100 MG PO CAPS: 100.0000 mg | ORAL_CAPSULE | Freq: Every day | ORAL | 1 refills | Status: DC

## 2022-01-27 NOTE — Assessment & Plan Note (Signed)
Smokes intermittently now  Asked about quitting: confirms that she currently smokes cigarettes Advise to quit smoking: Educated about QUITTING to reduce the risk of cancer, cardio and cerebrovascular disease. Assess willingness: Unwilling to quit at this time, but is working on cutting back. Assist with counseling and pharmacotherapy: Counseled for 5 minutes and literature provided. Chantix prescribed. Arrange for follow up: Follow up in 3 months and continue to offer help.

## 2022-01-27 NOTE — Assessment & Plan Note (Signed)
On aspirin and statin Followed by thoracic surgery and cardiology No chest pain or dyspnea currently

## 2022-01-27 NOTE — Assessment & Plan Note (Addendum)
Lab Results  Component Value Date   HGBA1C 6.9 01/27/2022   HGBA1C 6.9 01/27/2022   Well-controlled with Januvia 100 mg QD, Glipizide 5 mg BID and Jardiance 10 mg daily Switched from [place] to [employer] for added weight loss benefit Does not tolerate Metformin, 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 - added Gabapentin Advised to follow diabetic diet F/u CMP and lipid panel Diabetic eye exam: Advised to follow up with Ophthalmology for diabetic eye exam  Considering her CAD and DM, will check ABI to r/o PVD/PAD as she has b/l LE pain

## 2022-01-27 NOTE — Telephone Encounter (Signed)
Order to changed please schedule

## 2022-01-29 LAB — MICROALBUMIN / CREATININE URINE RATIO
Creatinine, Urine: 41.8 mg/dL
Microalb/Creat Ratio: 19 mg/g creat (ref 0–29)
Microalbumin, Urine: 7.9 ug/mL

## 2022-02-03 ENCOUNTER — Other Ambulatory Visit: Payer: Self-pay | Admitting: Internal Medicine

## 2022-02-03 ENCOUNTER — Other Ambulatory Visit: Payer: Self-pay

## 2022-02-03 ENCOUNTER — Other Ambulatory Visit: Payer: Self-pay | Admitting: Cardiology

## 2022-02-03 ENCOUNTER — Telehealth: Payer: Self-pay | Admitting: Internal Medicine

## 2022-02-03 DIAGNOSIS — E119 Type 2 diabetes mellitus without complications: Secondary | ICD-10-CM

## 2022-02-03 DIAGNOSIS — I10 Essential (primary) hypertension: Secondary | ICD-10-CM

## 2022-02-03 MED ORDER — GLIPIZIDE 5 MG PO TABS: 5.0000 mg | ORAL_TABLET | Freq: Two times a day (BID) | ORAL | 3 refills | Status: DC

## 2022-02-03 NOTE — Telephone Encounter (Signed)
Pt called stating she is out of glipiZIDE (GLUCOTROL) 5 MG tablet. Can you please refill?       [place]

## 2022-02-03 NOTE — Telephone Encounter (Signed)
Refills sent

## 2022-02-05 ENCOUNTER — Other Ambulatory Visit: Payer: Self-pay | Admitting: Internal Medicine

## 2022-02-13 ENCOUNTER — Ambulatory Visit (HOSPITAL_COMMUNITY)
Admission: RE | Admit: 2022-02-13 | Discharge: 2022-02-13 | Disposition: A | Discharge status: Home / Self Care | Payer: Medicare Other | Source: Ambulatory Visit | Attending: Internal Medicine | Admitting: Internal Medicine

## 2022-02-13 DIAGNOSIS — R921 Mammographic calcification found on diagnostic imaging of breast: Secondary | ICD-10-CM | POA: Diagnosis present

## 2022-02-19 ENCOUNTER — Other Ambulatory Visit: Payer: Self-pay | Admitting: Cardiology

## 2022-03-23 NOTE — Progress Notes (Unsigned)
Cardiology Office Note  Date: 03/24/2022   ID: Lauren, David [birth year], MRN [id number]  PCP:  Lindell Spar, MD  Cardiologist:  Rozann Lesches, MD Electrophysiologist:  None   Chief Complaint  Patient presents with   Cardiac follow-up    History of Present Illness: Lauren David is a 61 y.o. female last seen in February.  She is here for a follow-up visit.  Reports no angina symptoms on medical therapy.  Did well with cardiac rehabilitation.  She does have a membership to [employer], but admits that she has not been exercising regularly.  Otherwise active with ADLs.  I reviewed her medications which are stable from a cardiac perspective and outlined below.  She is due for a follow-up fasting lipid panel in October.  LDL was 64 last year.  Past Medical History:  Diagnosis Date   Anxiety    Asthma    CAD (coronary artery disease)    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   Chronic back pain    COPD (chronic obstructive pulmonary disease) (HCC)    Depression    Essential hypertension    Headache    Hyperlipidemia    Myocardial infarction ([place])    Pneumonia    Type 2 diabetes mellitus ([place])     Past Surgical History:  Procedure Laterality Date   BREAST BIOPSY Left    COLONOSCOPY N/A 04/26/2018   Procedure: COLONOSCOPY;  Surgeon: Rogene Houston, MD;  [facility];  Service: Endoscopy;  Laterality: N/A;  1:15   CORONARY ARTERY BYPASS GRAFT N/A 05/21/2021   Procedure: CORONARY ARTERY BYPASS GRAFTING (CABG) X4 ON PUMP USING LEFT INTERNAL MAMMARY ARTERY AND ENDOSCOPICALLY HARVESTED RIGHT GREATER SAPHENOUS VEIN;  Surgeon: Lajuana Matte, MD;  Location: [facility];  Service: Open Heart Surgery;  Laterality: N/A;   ENDOVEIN HARVEST OF GREATER SAPHENOUS VEIN Right 05/21/2021   Procedure: ENDOVEIN HARVEST OF GREATER SAPHENOUS VEIN;  Surgeon: Lajuana Matte, MD;  Location: [facility];  Service: Open Heart Surgery;  Laterality: Right;    INTRAVASCULAR PRESSURE WIRE/FFR STUDY N/A 05/12/2018   Procedure: INTRAVASCULAR PRESSURE WIRE/FFR STUDY;  Surgeon: Nelva Bush, MD;  Location: [facility];  Service: Cardiovascular;  Laterality: N/A;   INTRAVASCULAR PRESSURE WIRE/FFR STUDY N/A 04/22/2021   Procedure: INTRAVASCULAR PRESSURE WIRE/FFR STUDY;  Surgeon: Nelva Bush, MD;  Location: [facility];  Service: Cardiovascular;  Laterality: N/A;   LEFT HEART CATH AND CORONARY ANGIOGRAPHY N/A 05/12/2018   Procedure: LEFT HEART CATH AND CORONARY ANGIOGRAPHY;  Surgeon: Nelva Bush, MD;  Location: [facility];  Service: Cardiovascular;  Laterality: N/A;   LEFT HEART CATH AND CORONARY ANGIOGRAPHY N/A 04/22/2021   Procedure: LEFT HEART CATH AND CORONARY ANGIOGRAPHY;  Surgeon: Nelva Bush, MD;  Location: [facility];  Service: Cardiovascular;  Laterality: N/A;   POLYPECTOMY  04/26/2018   Procedure: POLYPECTOMY;  Surgeon: Rogene Houston, MD;  [facility];  Service: Endoscopy;;  colon    RADIAL ARTERY HARVEST Left 05/21/2021   Procedure: RADIAL ARTERY HARVEST;  Surgeon: Lajuana Matte, MD;  Location: [facility];  Service: Open Heart Surgery;  Laterality: Left;   TEE WITHOUT CARDIOVERSION N/A 05/21/2021   Procedure: TRANSESOPHAGEAL ECHOCARDIOGRAM (TEE);  Surgeon: Lajuana Matte, MD;  Location: [facility];  Service: Open Heart Surgery;  Laterality: N/A;   TOTAL VAGINAL HYSTERECTOMY      Current Outpatient Medications  Medication Sig Dispense Refill   acetaminophen (  TYLENOL) 500 MG tablet Take 1,000 mg by mouth every 6 (six) hours as needed for moderate pain or headache.     albuterol (PROVENTIL) (2.5 MG/3ML) 0.083% nebulizer solution ONE VIAL BY NEBULIZATION EVERY 6 HOURS AS NEEDED FOR WHEEZING OR SHORTNESS OF BREATH. (Patient taking differently: Take 2.5 mg by nebulization every 6 (six) hours as needed for wheezing.) 180 mL 0   albuterol (VENTOLIN HFA) 108 (90 Base) MCG/ACT inhaler Inhale 2  puffs into the lungs every 4 (four) hours as needed for wheezing or shortness of breath. 17 g 0   amLODipine (NORVASC) 2.5 MG tablet TAKE 1 TABLET ONCE DAILY. 30 tablet 5   aspirin EC 81 MG tablet Take 81 mg by mouth daily. Swallow whole.     blood glucose meter kit and supplies Dispense based on patient and insurance preference. Use up to four times daily as directed. (FOR ICD-10 E10.9, E11.9). 1 each 0   Dulaglutide (TRULICITY) 1.5 PG/9.8MK SOPN Inject 1.5 mg into the skin once a week. 6 mL 0   empagliflozin (JARDIANCE) 10 MG TABS tablet Take 1 tablet (10 mg total) by mouth daily before breakfast. 30 tablet 2   gabapentin (NEURONTIN) 100 MG capsule Take 1 capsule (100 mg total) by mouth at bedtime. 90 capsule 1   glipiZIDE (GLUCOTROL) 5 MG tablet TAKE 1 TABLET BY MOUTH TWICE DAILY BEFORE A MEAL. 60 tablet 0   metoprolol tartrate (LOPRESSOR) 25 MG tablet TAKE (1/2) TABLET BY MOUTH 2 TIMES A DAY. 30 tablet 6   nicotine (NICODERM CQ - DOSED IN MG/24 HOURS) 14 mg/24hr patch Place 1 patch (14 mg total) onto the skin daily. 28 patch 1   rosuvastatin (CRESTOR) 20 MG tablet TAKE 1 TABLET ONCE DAILY. 30 tablet 6   umeclidinium bromide (INCRUSE ELLIPTA) 62.5 MCG/INH AEPB Inhale 1 puff into the lungs daily. 30 each 2   varenicline (CHANTIX) 1 MG tablet TAKE (1) TABLET TWICE DAILY. 60 tablet 0   No current facility-administered medications for this visit.   Allergies:  Percocet [oxycodone-acetaminophen]   ROS:  No palpitations or syncope.  Physical Exam: VS:  BP 138/70 Comment: hasn't taken meds yet this morning  Pulse 76   Ht '5\' 1"'  (1.549 m)   Wt 191 lb 3.2 oz (86.7 kg)   SpO2 99%   BMI 36.13 kg/m , BMI Body mass index is 36.13 kg/m.  Wt Readings from Last 3 Encounters:  03/24/22 191 lb 3.2 oz (86.7 kg)  01/27/22 187 lb 12.8 oz (85.2 kg)  10/30/21 173 lb 8 oz (78.7 kg)    General: Patient appears comfortable at rest. HEENT: Conjunctiva and lids normal. Neck: Supple, no elevated JVP or  carotid bruits, no thyromegaly. Lungs: Clear to auscultation, nonlabored breathing at rest. Cardiac: Regular rate and rhythm, no S3 or significant systolic murmur. Extremities: No pitting edema.  ECG:  An ECG dated 07/10/2021 was personally reviewed today and demonstrated:  Sinus rhythm with nonspecific T wave changes.  Recent Labwork: 05/22/2021: Magnesium 2.2 09/17/2021: ALT 14; AST 15; BUN 9; Creatinine, Ser 0.67; Hemoglobin 13.6; Platelets 276; Potassium 4.3; Sodium 138     Component Value Date/Time   CHOL 105 05/22/2021 0308   CHOL 251 (H) 01/24/2021 0937   TRIG 72 05/22/2021 0308   HDL 27 (L) 05/22/2021 0308   HDL 48 01/24/2021 0937   CHOLHDL 3.9 05/22/2021 0308   VLDL 14 05/22/2021 0308   LDLCALC 64 05/22/2021 0308   LDLCALC 179 (H) 01/24/2021 0937    Other  Studies Reviewed Today:  Echocardiogram 04/25/2021:  1. Left ventricular ejection fraction, by estimation, is 60 to 65%. The  left ventricle has normal function. The left ventricle has no regional  wall motion abnormalities. Left ventricular diastolic parameters are  consistent with Grade I diastolic  dysfunction (impaired relaxation).   2. Right ventricular systolic function is normal. The right ventricular  size is normal.   3. The mitral valve is normal in structure. No evidence of mitral valve  regurgitation. No evidence of mitral stenosis.   4. The aortic valve is tricuspid. Aortic valve regurgitation is not  visualized. No aortic stenosis is present.   5. The inferior vena cava is normal in size with greater than 50%  respiratory variability, suggesting right atrial pressure of 3 mmHg.    Carotid and PV Dopplers 05/17/2021: Summary:  Right Carotid: Velocities in the right ICA are consistent with a 40-59%                 stenosis.   Left Carotid: Velocities in the left ICA are consistent with a 1-39%  stenosis.  Vertebrals: Bilateral vertebral arteries demonstrate antegrade flow.   Right ABI: Resting right  ankle-brachial index is within normal range. No  evidence of significant right lower extremity arterial disease.  Left ABI: Resting left ankle-brachial index is within normal range. No  evidence of significant left lower extremity arterial disease.  Right Upper Extremity: Doppler waveforms remain within normal limits with  right radial compression. Doppler waveforms remain within normal limits  with right ulnar compression.  Left Upper Extremity: Doppler waveforms remain within normal limits with  left radial compression. Doppler waveforms remain within normal limits  with left ulnar compression.  Assessment and Plan:  1.  Multivessel CAD status post CABG in October 2022.  She is doing well without angina on medical therapy.  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  Continue aspirin, Norvasc, Jardiance, Lopressor, and Crestor.  2.  Mixed hyperlipidemia, continue Crestor and follow-up FLP in October with routine lab work.  3.  Essential hypertension, no changes to current regimen.  Medication Adjustments/Labs and Tests Ordered: Current medicines are reviewed at length with the patient today.  Concerns regarding medicines are outlined above.   Tests Ordered: Orders Placed This Encounter  Procedures   Lipid panel    Medication Changes: No orders of the defined types were placed in this encounter.   Disposition:  Follow up  6 months.  Signed, Satira Sark, MD, [facility] 03/24/2022 11:52 AM    [facility] [place], [place], [place] [address] Phone: [phone]; Fax: [phone]

## 2022-03-24 ENCOUNTER — Ambulatory Visit (INDEPENDENT_AMBULATORY_CARE_PROVIDER_SITE_OTHER): Payer: Medicare Other | Admitting: Cardiology

## 2022-03-24 ENCOUNTER — Other Ambulatory Visit: Payer: Self-pay | Admitting: *Deleted

## 2022-03-24 ENCOUNTER — Other Ambulatory Visit: Payer: Self-pay | Admitting: Internal Medicine

## 2022-03-24 ENCOUNTER — Encounter: Payer: Self-pay | Admitting: Cardiology

## 2022-03-24 VITALS — BP 138/70 | HR 76 | Ht 61.0 in | Wt 191.2 lb

## 2022-03-24 DIAGNOSIS — E782 Mixed hyperlipidemia: Secondary | ICD-10-CM | POA: Diagnosis not present

## 2022-03-24 DIAGNOSIS — I10 Essential (primary) hypertension: Secondary | ICD-10-CM | POA: Diagnosis not present

## 2022-03-24 DIAGNOSIS — Z79899 Other long term (current) drug therapy: Secondary | ICD-10-CM

## 2022-03-24 DIAGNOSIS — Z720 Tobacco use: Secondary | ICD-10-CM

## 2022-03-24 DIAGNOSIS — I25119 Atherosclerotic heart disease of native coronary artery with unspecified angina pectoris: Secondary | ICD-10-CM | POA: Diagnosis not present

## 2022-03-24 NOTE — Patient Instructions (Addendum)
Medication Instructions:  Your physician recommends that you continue on your current medications as directed. Please refer to the Current Medication list given to you today.  Labwork: Your physician recommends that you return for a FASTING lipid profile: October 2023 with your family doctor's lab work. Please do not eat or drink for at least 8 hours when you have this done. You may take your medications that morning with a sip of water.  Testing/Procedures: none  Follow-Up: Your physician recommends that you schedule a follow-up appointment in: 6 months  Any Other Special Instructions Will Be Listed Below (If Applicable).  If you need a refill on your cardiac medications before your next appointment, please call your pharmacy.

## 2022-04-08 ENCOUNTER — Ambulatory Visit (INDEPENDENT_AMBULATORY_CARE_PROVIDER_SITE_OTHER): Payer: Medicare Other

## 2022-04-08 DIAGNOSIS — Z0000 Encounter for general adult medical examination without abnormal findings: Secondary | ICD-10-CM

## 2022-04-08 NOTE — Patient Instructions (Signed)
Lauren David , Thank you for taking time to come for your Medicare Wellness Visit. I appreciate your ongoing commitment to your health goals. Please review the following plan we discussed and let me know if I can assist you in the future.   Screening recommendations/referrals: Colonoscopy: Completed Mammogram: Completed Recommended yearly ophthalmology/optometry visit for glaucoma screening and checkup Recommended yearly dental visit for hygiene and checkup  Vaccinations: Influenza vaccine: Due Tdap vaccine: Due Shingles vaccine: Due    Advanced directives: patient declined  Conditions/risks identified: Falls, hypertension  Next appointment: 1 year  Preventive Care 40-64 Years, Female Preventive care refers to lifestyle choices and visits with your health care provider that can promote health and wellness. What does preventive care include? A yearly physical exam. This is also called an annual well check. Dental exams once or twice a year. Routine eye exams. Ask your health care provider how often you should have your eyes checked. Personal lifestyle choices, including: Daily care of your teeth and gums. Regular physical activity. Eating a healthy diet. Avoiding tobacco and drug use. Limiting alcohol use. Practicing safe sex. Taking low-dose aspirin daily starting at age 80. Taking vitamin and mineral supplements as recommended by your health care provider. What happens during an annual well check? The services and screenings done by your health care provider during your annual well check will depend on your age, overall health, lifestyle risk factors, and family history of disease. Counseling  Your health care provider may ask you questions about your: Alcohol use. Tobacco use. Drug use. Emotional well-being. Home and relationship well-being. Sexual activity. Eating habits. Work and work Statistician. Method of birth control. Menstrual cycle. Pregnancy  history. Screening  You may have the following tests or measurements: Height, weight, and BMI. Blood pressure. Lipid and cholesterol levels. These may be checked every 5 years, or more frequently if you are over 6 years old. Skin check. Lung cancer screening. You may have this screening every year starting at age 8 if you have a 30-pack-year history of smoking and currently smoke or have quit within the past 15 years. Fecal occult blood test (FOBT) of the stool. You may have this test every year starting at age 12. Flexible sigmoidoscopy or colonoscopy. You may have a sigmoidoscopy every 5 years or a colonoscopy every 10 years starting at age 38. Hepatitis C blood test. Hepatitis B blood test. Sexually transmitted disease (STD) testing. Diabetes screening. This is done by checking your blood sugar (glucose) after you have not eaten for a while (fasting). You may have this done every 1-3 years. Mammogram. This may be done every 1-2 years. Talk to your health care provider about when you should start having regular mammograms. This may depend on whether you have a family history of breast cancer. BRCA-related cancer screening. This may be done if you have a family history of breast, ovarian, tubal, or peritoneal cancers. Pelvic exam and Pap test. This may be done every 3 years starting at age 62. Starting at age 25, this may be done every 5 years if you have a Pap test in combination with an HPV test. Bone density scan. This is done to screen for osteoporosis. You may have this scan if you are at high risk for osteoporosis. Discuss your test results, treatment options, and if necessary, the need for more tests with your health care provider. Vaccines  Your health care provider may recommend certain vaccines, such as: Influenza vaccine. This is recommended every year. Tetanus, diphtheria, and  acellular pertussis (Tdap, Td) vaccine. You may need a Td booster every 10 years. Zoster vaccine. You  may need this after age 22. Pneumococcal 13-valent conjugate (PCV13) vaccine. You may need this if you have certain conditions and were not previously vaccinated. Pneumococcal polysaccharide (PPSV23) vaccine. You may need one or two doses if you smoke cigarettes or if you have certain conditions. Talk to your health care provider about which screenings and vaccines you need and how often you need them. This information is not intended to replace advice given to you by your health care provider. Make sure you discuss any questions you have with your health care provider. Document Released: 08/17/2015 Document Revised: 04/09/2016 Document Reviewed: 05/22/2015 Elsevier Interactive Patient Education  2017 [place] Prevention in the Home Falls can cause injuries. They can happen to people of all ages. There are many things you can do to make your home safe and to help prevent falls. What can I do on the outside of my home? Regularly fix the edges of walkways and driveways and fix any cracks. Remove anything that might make you trip as you walk through a door, such as a raised step or threshold. Trim any bushes or trees on the path to your home. Use bright outdoor lighting. Clear any walking paths of anything that might make someone trip, such as rocks or tools. Regularly check to see if handrails are loose or broken. Make sure that both sides of any steps have handrails. Any raised decks and porches should have guardrails on the edges. Have any leaves, snow, or ice cleared regularly. Use sand or salt on walking paths during winter. Clean up any spills in your garage right away. This includes oil or grease spills. What can I do in the bathroom? Use night lights. Install grab bars by the toilet and in the tub and shower. Do not use towel bars as grab bars. Use non-skid mats or decals in the tub or shower. If you need to sit down in the shower, use a plastic, non-slip stool. Keep  the floor dry. Clean up any water that spills on the floor as soon as it happens. Remove soap buildup in the tub or shower regularly. Attach bath mats securely with double-sided non-slip rug tape. Do not have throw rugs and other things on the floor that can make you trip. What can I do in the bedroom? Use night lights. Make sure that you have a light by your bed that is easy to reach. Do not use any sheets or blankets that are too big for your bed. They should not hang down onto the floor. Have a firm chair that has side arms. You can use this for support while you get dressed. Do not have throw rugs and other things on the floor that can make you trip. What can I do in the kitchen? Clean up any spills right away. Avoid walking on wet floors. Keep items that you use a lot in easy-to-reach places. If you need to reach something above you, use a strong step stool that has a grab bar. Keep electrical cords out of the way. Do not use floor polish or wax that makes floors slippery. If you must use wax, use non-skid floor wax. Do not have throw rugs and other things on the floor that can make you trip. What can I do with my stairs? Do not leave any items on the stairs. Make sure that there are handrails  on both sides of the stairs and use them. Fix handrails that are broken or loose. Make sure that handrails are as long as the stairways. Check any carpeting to make sure that it is firmly attached to the stairs. Fix any carpet that is loose or worn. Avoid having throw rugs at the top or bottom of the stairs. If you do have throw rugs, attach them to the floor with carpet tape. Make sure that you have a light switch at the top of the stairs and the bottom of the stairs. If you do not have them, ask someone to add them for you. What else can I do to help prevent falls? Wear shoes that: Do not have high heels. Have rubber bottoms. Are comfortable and fit you well. Are closed at the toe. Do not  wear sandals. If you use a stepladder: Make sure that it is fully opened. Do not climb a closed stepladder. Make sure that both sides of the stepladder are locked into place. Ask someone to hold it for you, if possible. Clearly mark and make sure that you can see: Any grab bars or handrails. First and last steps. Where the edge of each step is. Use tools that help you move around (mobility aids) if they are needed. These include: Canes. Walkers. Scooters. Crutches. Turn on the lights when you go into a dark area. Replace any light bulbs as soon as they burn out. Set up your furniture so you have a clear path. Avoid moving your furniture around. If any of your floors are uneven, fix them. If there are any pets around you, be aware of where they are. Review your medicines with your doctor. Some medicines can make you feel dizzy. This can increase your chance of falling. Ask your doctor what other things that you can do to help prevent falls. This information is not intended to replace advice given to you by your health care provider. Make sure you discuss any questions you have with your health care provider. Document Released: 05/17/2009 Document Revised: 12/27/2015 Document Reviewed: 08/25/2014 Elsevier Interactive Patient Education  2017 [employer].

## 2022-04-08 NOTE — Progress Notes (Signed)
Subjective:   Lauren David is a 61 y.o. female who presents for Medicare Annual (Subsequent) preventive examination.  I connected with  Lauren David on 04/08/22 by a audio enabled telemedicine application and verified that I am speaking with the correct person using two identifiers.  Patient Location: Home  Provider Location: Office/Clinic  I discussed the limitations of evaluation and management by telemedicine. The patient expressed understanding and agreed to proceed.   Review of Systems     Lauren David , Thank you for taking time to come for your Medicare Wellness Visit. I appreciate your ongoing commitment to your health goals. Please review the following plan we discussed and let me know if I can assist you in the future.   These are the goals we discussed:  Goals      Patient Stated     She would like to be more compliant with healthcare         This is a list of the screening recommended for you and due dates:  Health Maintenance  Topic Date Due   Eye exam for diabetics  Never done   Tetanus Vaccine  Never done   Zoster (Shingles) Vaccine (1 of 2) Never done   Pap Smear  Never done   COVID-19 Vaccine (5 - Mixed Product risk series) 07/04/2021   Flu Shot  03/04/2022   Complete foot exam   03/11/2022   Hemoglobin A1C  07/29/2022   Urine Protein Check  01/28/2023   Mammogram  02/14/2024   Colon Cancer Screening  04/26/2025   Hepatitis C Screening: USPSTF Recommendation to screen - Ages 18-79 yo.  Completed   HIV Screening  Completed   HPV Vaccine  Aged Out          Objective:    There were no vitals filed for this visit. There is no height or weight on file to calculate BMI.     08/09/2021    1:23 PM 05/21/2021    6:04 AM 05/17/2021    2:24 PM 04/22/2021    9:18 AM 03/11/2021   10:59 AM 04/26/2018    8:33 AM  Advanced Directives  Does Patient Have a Medical Advance Directive? _0  No  Would patient like information on creating a medical  advance directive? Yes (MAU/Ambulatory/Procedural Areas - Information given) No - Patient declined No - Patient declined No - Patient declined Yes (MAU/Ambulatory/Procedural Areas - Information given) No - Patient declined    Current Medications (verified) Outpatient Encounter Medications as of 04/08/2022  Medication Sig   acetaminophen (TYLENOL) 500 MG tablet Take 1,000 mg by mouth every 6 (six) hours as needed for moderate pain or headache.   albuterol (PROVENTIL) (2.5 MG/3ML) 0.083% nebulizer solution ONE VIAL BY NEBULIZATION EVERY 6 HOURS AS NEEDED FOR WHEEZING OR SHORTNESS OF BREATH. (Patient taking differently: Take 2.5 mg by nebulization every 6 (six) hours as needed for wheezing.)   albuterol (VENTOLIN HFA) 108 (90 Base) MCG/ACT inhaler Inhale 2 puffs into the lungs every 4 (four) hours as needed for wheezing or shortness of breath.   amLODipine (NORVASC) 2.5 MG tablet TAKE 1 TABLET ONCE DAILY.   aspirin EC 81 MG tablet Take 81 mg by mouth daily. Swallow whole.   blood glucose meter kit and supplies Dispense based on patient and insurance preference. Use up to four times daily as directed. (FOR ICD-10 E10.9, E11.9).   Dulaglutide (TRULICITY) 1.5 MG/8.6PY SOPN Inject 1.5 mg into the skin once a  week.   empagliflozin (JARDIANCE) 10 MG TABS tablet Take 1 tablet (10 mg total) by mouth daily before breakfast.   gabapentin (NEURONTIN) 100 MG capsule Take 1 capsule (100 mg total) by mouth at bedtime.   glipiZIDE (GLUCOTROL) 5 MG tablet TAKE 1 TABLET BY MOUTH TWICE DAILY BEFORE A MEAL.   metoprolol tartrate (LOPRESSOR) 25 MG tablet TAKE (1/2) TABLET BY MOUTH 2 TIMES A DAY.   nicotine (NICODERM CQ - DOSED IN MG/24 HOURS) 14 mg/24hr patch Place 1 patch (14 mg total) onto the skin daily.   rosuvastatin (CRESTOR) 20 MG tablet TAKE 1 TABLET ONCE DAILY.   umeclidinium bromide (INCRUSE ELLIPTA) 62.5 MCG/INH AEPB Inhale 1 puff into the lungs daily.   varenicline (CHANTIX) 1 MG tablet TAKE (1) TABLET TWICE  DAILY.   No facility-administered encounter medications on file as of 04/08/2022.    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   Chronic back pain    COPD (chronic obstructive pulmonary disease) (HCC)    Depression    Essential hypertension    Headache    Hyperlipidemia    Myocardial infarction ([place])    Pneumonia    Type 2 diabetes mellitus ([place])    Past Surgical History:  Procedure Laterality Date   BREAST BIOPSY Left    COLONOSCOPY N/A 04/26/2018   Procedure: COLONOSCOPY;  Surgeon: Rogene Houston, MD;  [facility];  Service: Endoscopy;  Laterality: N/A;  1:15   CORONARY ARTERY BYPASS GRAFT N/A 05/21/2021   Procedure: CORONARY ARTERY BYPASS GRAFTING (CABG) X4 ON PUMP USING LEFT INTERNAL MAMMARY ARTERY AND ENDOSCOPICALLY HARVESTED RIGHT GREATER SAPHENOUS VEIN;  Surgeon: Lajuana Matte, MD;  Location: [place];  Service: Open Heart Surgery;  Laterality: N/A;   ENDOVEIN HARVEST OF GREATER SAPHENOUS VEIN Right 05/21/2021   Procedure: ENDOVEIN HARVEST OF GREATER SAPHENOUS VEIN;  Surgeon: Lajuana Matte, MD;  Location: [place];  Service: Open Heart Surgery;  Laterality: Right;   INTRAVASCULAR PRESSURE WIRE/FFR STUDY N/A 05/12/2018   Procedure: INTRAVASCULAR PRESSURE WIRE/FFR STUDY;  Surgeon: Nelva Bush, MD;  Location: [place] [facility];  Service: Cardiovascular;  Laterality: N/A;   INTRAVASCULAR PRESSURE WIRE/FFR STUDY N/A 04/22/2021   Procedure: INTRAVASCULAR PRESSURE WIRE/FFR STUDY;  Surgeon: Nelva Bush, MD;  Location: [place] [facility];  Service: Cardiovascular;  Laterality: N/A;   LEFT HEART CATH AND CORONARY ANGIOGRAPHY N/A 05/12/2018   Procedure: LEFT HEART CATH AND CORONARY ANGIOGRAPHY;  Surgeon: Nelva Bush, MD;  Location: [place] [facility];  Service: Cardiovascular;   Laterality: N/A;   LEFT HEART CATH AND CORONARY ANGIOGRAPHY N/A 04/22/2021   Procedure: LEFT HEART CATH AND CORONARY ANGIOGRAPHY;  Surgeon: Nelva Bush, MD;  Location: [place] [facility];  Service: Cardiovascular;  Laterality: N/A;   POLYPECTOMY  04/26/2018   Procedure: POLYPECTOMY;  Surgeon: Rogene Houston, MD;  [facility];  Service: Endoscopy;;  colon    RADIAL ARTERY HARVEST Left 05/21/2021   Procedure: RADIAL ARTERY HARVEST;  Surgeon: Lajuana Matte, MD;  Location: [place];  Service: Open Heart Surgery;  Laterality: Left;   TEE WITHOUT CARDIOVERSION N/A 05/21/2021   Procedure: TRANSESOPHAGEAL ECHOCARDIOGRAM (TEE);  Surgeon: Lajuana Matte, MD;  Location: [place];  Service: Open Heart Surgery;  Laterality: N/A;   TOTAL VAGINAL HYSTERECTOMY     Family  History  Problem Relation Age of Onset   Stroke Father    Hypertension Father    Breast cancer Paternal Aunt    Breast cancer Paternal Aunt    Breast cancer Paternal Grandmother    Social History   Socioeconomic History   Marital status: Married    Spouse name: Not on file   Number of children: Not on file   Years of education: Not on file   Highest education level: Not on file  Occupational History   Not on file  Tobacco Use   Smoking status: Former    Packs/day: 1.00    Types: Cigarettes   Smokeless tobacco: Never   Tobacco comments:    cut back to 1 pack a day. Smokig x 3yr. Did smoke 2 packs x 5 yrs, but cut back a month ago.   Vaping Use   Vaping Use: Never used  Substance and Sexual Activity   Alcohol use: Never   Drug use: Never   Sexual activity: Not on file  Other Topics Concern   Not on file  Social History Narrative   Not on file   Social Determinants of Health   Financial Resource Strain: Low Risk  (03/11/2021)   Overall Financial Resource Strain (CARDIA)    Difficulty of Paying Living Expenses: Not hard at all  Food Insecurity: No Food Insecurity (03/11/2021)   Hunger Vital  Sign    Worried About Running Out of Food in the Last Year: Never true    RGeyserin the Last Year: Never true  Transportation Needs: No Transportation Needs (03/11/2021)   PRAPARE - THydrologist(Medical): No    Lack of Transportation (Non-Medical): No  Physical Activity: Insufficiently Active (03/11/2021)   Exercise Vital Sign    Days of Exercise per Week: 3 days    Minutes of Exercise per Session: 30 min  Stress: No Stress Concern Present (03/11/2021)   [place]   Feeling of Stress : Only a little  Social Connections: Moderately Isolated (03/11/2021)   Social Connection and Isolation Panel [NHANES]    Frequency of Communication with Friends and Family: More than three times a week    Frequency of Social Gatherings with Friends and Family: More than three times a week    Attends Religious Services: More than 4 times per year    Active Member of [employer] Organizations: No    Attends CArchivistMeetings: Never    Marital Status: Separated    Tobacco Counseling Counseling given: Not Answered Tobacco comments: cut back to 1 pack a day. Smokig x 428yr Did smoke 2 packs x 5 yrs, but cut back a month ago.    Clinical Intake:                 Diabetic? No         Activities of Daily Living    05/24/2021   12:48 AM 05/17/2021    2:27 PM  In your present state of health, do you have any difficulty performing the following activities:  Hearing? 0   Vision? 0   Difficulty concentrating or making decisions? 0   Walking or climbing stairs? 1   Dressing or bathing? 0   Doing errands, shopping? 0 0    Patient Care Team: PaLindell SparMD as PCP - General (Internal Medicine) McSatira SarkMD as PCP - Cardiology (Cardiology)  Indicate any recent Medical Services you may have received from other than [facility] providers in the past year (date may be  approximate).     Assessment:   This is a routine wellness examination for Zilah.  Hearing/Vision screen No results found.  Dietary issues and exercise activities discussed:     Goals Addressed   None   Depression Screen    01/27/2022    1:45 PM 11/05/2021    1:38 PM 09/17/2021    1:24 PM 08/09/2021    1:25 PM 06/06/2021    2:54 PM 03/11/2021   11:00 AM 03/11/2021   10:57 AM  PHQ 2/9 Scores  PHQ - 2 Score 0 0 0 0 0 0 0  PHQ- 9 Score  1  4       Fall Risk    01/27/2022    1:45 PM 09/17/2021    1:24 PM 08/09/2021    1:25 PM 06/06/2021    2:54 PM 03/11/2021   11:00 AM  Fall Risk   Falls in the past year? 0 0 0 0 0  Number falls in past yr: 0 0 0 0 0  Injury with Fall? 0 0 0 0 0  Risk for fall due to : No Fall Risks No Fall Risks Impaired balance/gait No Fall Risks No Fall Risks  Follow up _0     FALL RISK PREVENTION PERTAINING TO THE HOME:  Any stairs in or around the home? Yes  If so, are there any without handrails? No  Home free of loose throw rugs in walkways, pet beds, electrical cords, etc? Yes  Adequate lighting in your home to reduce risk of falls? Yes   ASSISTIVE DEVICES UTILIZED TO PREVENT FALLS:  Life alert? No  Use of a cane, walker or w/c? No  Grab bars in the bathroom? Yes  Shower chair or bench in shower? Yes  Elevated toilet seat or a handicapped toilet? Yes    Cognitive Function:    03/11/2021   11:01 AM  MMSE - Mini Mental State Exam  Not completed: Unable to complete        03/11/2021   11:01 AM  6CIT Screen  What Year? 0 points  What month? 0 points  What time? 0 points  Count back from 20 0 points  Months in reverse 0 points  Repeat phrase 0 points  Total Score 0 points    Immunizations Immunization History  Administered Date(s) Administered   Influenza,inj,Quad PF,6+ Mos 09/10/2020   Moderna SARS-COV2  Booster Vaccination 05/09/2021   Moderna Sars-Covid-2 Vaccination 08/10/2019, 10/20/2019, 11/18/2019   PFIZER(Purple Top)SARS-COV-2 Vaccination 10/20/2019    TDAP status: Due, Education has been provided regarding the importance of this vaccine. Advised may receive this vaccine at local pharmacy or Health Dept. Aware to provide a copy of the vaccination record if obtained from local pharmacy or Health Dept. Verbalized acceptance and understanding.  Flu Vaccine status: Due, Education has been provided regarding the importance of this vaccine. Advised may receive this vaccine at local pharmacy or Health Dept. Aware to provide a copy of the vaccination record if obtained from local pharmacy or Health Dept. Verbalized acceptance and understanding.   Covid-19 vaccine status: Completed vaccines  Qualifies for Shingles Vaccine? Yes   Zostavax completed No   Shingrix Completed?: No.    Education has been provided regarding the importance of this vaccine. Patient has been advised to call insurance  company to determine out of pocket expense if they have not yet received this vaccine. Advised may also receive vaccine at local pharmacy or Health Dept. Verbalized acceptance and understanding.  Screening Tests Health Maintenance  Topic Date Due   OPHTHALMOLOGY EXAM  Never done   TETANUS/TDAP  Never done   Zoster Vaccines- Shingrix (1 of 2) Never done   PAP SMEAR-Modifier  Never done   COVID-19 Vaccine (5 - Mixed Product risk series) 07/04/2021   INFLUENZA VACCINE  03/04/2022   FOOT EXAM  03/11/2022   HEMOGLOBIN A1C  07/29/2022   URINE MICROALBUMIN  01/28/2023   MAMMOGRAM  02/14/2024   COLONOSCOPY (Pts 45-68yr Insurance coverage will need to be confirmed)  04/26/2025   Hepatitis C Screening  Completed   HIV Screening  Completed   HPV VACCINES  Aged Out    Health Maintenance  Health Maintenance Due  Topic Date Due   OPHTHALMOLOGY EXAM  Never done   TETANUS/TDAP  Never done   Zoster  Vaccines- Shingrix (1 of 2) Never done   PAP SMEAR-Modifier  Never done   COVID-19 Vaccine (5 - Mixed Product risk series) 07/04/2021   INFLUENZA VACCINE  03/04/2022   FOOT EXAM  03/11/2022    Colorectal cancer screening: Type of screening: Colonoscopy. Completed 04/26/2018. Repeat every 10 years  Mammogram status: Completed 02/13/2022. Repeat every year  Bone Density status: Completed 07/18/2021. Results reflect: Bone density results: OSTEOPENIA. Repeat every 3 years.  Lung Cancer Screening: (Low Dose CT Chest recommended if Age 63-80years, 30 pack-year currently smoking OR have quit w/in 15years.) does not qualify.    Additional Screening:  Hepatitis C Screening: does qualify; Completed 09/17/2021  Vision Screening: Recommended annual ophthalmology exams for early detection of glaucoma and other disorders of the eye. Is the patient up to date with their annual eye exam?  Yes  Who is the provider or what is the name of the office in which the patient attends annual eye exams? [facility]: Recommended annual dental exams for proper oral hygiene  Community Resource Referral / Chronic Care Management: CRR required this visit?  No   CCM required this visit?  No      Plan:     I have personally reviewed and noted the following in the patient's chart:   Medical and social history Use of alcohol, tobacco or illicit drugs  Current medications and supplements including opioid prescriptions. Patient is not currently taking opioid prescriptions. Functional ability and status Nutritional status Physical activity Advanced directives List of other physicians Hospitalizations, surgeries, and ER visits in previous 12 months Vitals Screenings to include cognitive, depression, and falls Referrals and appointments  In addition, I have reviewed and discussed with patient certain preventive protocols, quality metrics, and best practice recommendations. A written  personalized care plan for preventive services as well as general preventive health recommendations were provided to patient.     KJohny Drilling [place]  04/08/2022   Nurse Notes:  Ms. EHorton, Thank you for taking time to come for your Medicare Wellness Visit. I appreciate your ongoing commitment to your health goals. Please review the following plan we discussed and let me know if I can assist you in the future.   These are the goals we discussed:  Goals      Patient Stated     She would like to be more compliant with healthcare         This is a list of  the screening recommended for you and due dates:  Health Maintenance  Topic Date Due   Eye exam for diabetics  Never done   Tetanus Vaccine  Never done   Zoster (Shingles) Vaccine (1 of 2) Never done   Pap Smear  Never done   COVID-19 Vaccine (5 - Mixed Product risk series) 07/04/2021   Flu Shot  03/04/2022   Complete foot exam   03/11/2022   Hemoglobin A1C  07/29/2022   Urine Protein Check  01/28/2023   Mammogram  02/14/2024   Colon Cancer Screening  04/26/2025   Hepatitis C Screening: USPSTF Recommendation to screen - Ages 18-79 yo.  Completed   HIV Screening  Completed   HPV Vaccine  Aged Out

## 2022-04-11 ENCOUNTER — Other Ambulatory Visit: Payer: Self-pay | Admitting: Internal Medicine

## 2022-04-11 DIAGNOSIS — E1151 Type 2 diabetes mellitus with diabetic peripheral angiopathy without gangrene: Secondary | ICD-10-CM

## 2022-05-06 ENCOUNTER — Other Ambulatory Visit: Payer: Self-pay | Admitting: Internal Medicine

## 2022-05-06 DIAGNOSIS — E1151 Type 2 diabetes mellitus with diabetic peripheral angiopathy without gangrene: Secondary | ICD-10-CM

## 2022-05-29 LAB — CMP14+EGFR
ALT: 17 IU/L (ref 0–32)
AST: 17 IU/L (ref 0–40)
Albumin/Globulin Ratio: 1.5 (ref 1.2–2.2)
Albumin: 4 g/dL (ref 3.9–4.9)
Alkaline Phosphatase: 82 IU/L (ref 44–121)
BUN/Creatinine Ratio: 11 — ABNORMAL LOW (ref 12–28)
BUN: 7 mg/dL — ABNORMAL LOW (ref 8–27)
Bilirubin Total: <0.2 mg/dL (ref 0.0–1.2)
CO2: 23 mmol/L (ref 20–29)
Calcium: 9.1 mg/dL (ref 8.7–10.3)
Chloride: 105 mmol/L (ref 96–106)
Creatinine, Ser: 0.61 mg/dL (ref 0.57–1.00)
Globulin, Total: 2.6 g/dL (ref 1.5–4.5)
Glucose: 106 mg/dL — ABNORMAL HIGH (ref 70–99)
Potassium: 4 mmol/L (ref 3.5–5.2)
Sodium: 141 mmol/L (ref 134–144)
Total Protein: 6.6 g/dL (ref 6.0–8.5)
eGFR: 101 mL/min/{1.73_m2} (ref >59)

## 2022-05-29 LAB — CBC WITH DIFFERENTIAL/PLATELET
Basophils Absolute: 0.1 10*3/uL (ref 0.0–0.2)
Basos: 1 %
EOS (ABSOLUTE): 0.5 10*3/uL — ABNORMAL HIGH (ref 0.0–0.4)
Eos: 5 %
Hematocrit: 42.3 % (ref 34.0–46.6)
Hemoglobin: 13.2 g/dL (ref 11.1–15.9)
Immature Grans (Abs): 0 10*3/uL (ref 0.0–0.1)
Immature Granulocytes: 0 %
Lymphocytes Absolute: 3.2 10*3/uL — ABNORMAL HIGH (ref 0.7–3.1)
Lymphs: 30 %
MCH: 27.7 pg (ref 26.6–33.0)
MCHC: 31.2 g/dL — ABNORMAL LOW (ref 31.5–35.7)
MCV: 89 fL (ref 79–97)
Monocytes Absolute: 0.6 10*3/uL (ref 0.1–0.9)
Monocytes: 6 %
Neutrophils Absolute: 6.4 10*3/uL (ref 1.4–7.0)
Neutrophils: 58 %
Platelets: 252 10*3/uL (ref 150–450)
RBC: 4.77 x10E6/uL (ref 3.77–5.28)
RDW: 12.9 % (ref 11.7–15.4)
WBC: 10.9 10*3/uL — ABNORMAL HIGH (ref 3.4–10.8)

## 2022-05-29 LAB — LIPID PANEL
Chol/HDL Ratio: 2.4 ratio (ref 0.0–4.4)
Cholesterol, Total: 125 mg/dL (ref 100–199)
HDL: 52 mg/dL (ref >39)
LDL Chol Calc (NIH): 56 mg/dL (ref 0–99)
Triglycerides: 86 mg/dL (ref 0–149)
VLDL Cholesterol Cal: 17 mg/dL (ref 5–40)

## 2022-05-29 LAB — VITAMIN D 25 HYDROXY (VIT D DEFICIENCY, FRACTURES): Vit D, 25-Hydroxy: 9.7 ng/mL — ABNORMAL LOW (ref 30.0–100.0)

## 2022-05-29 LAB — HEMOGLOBIN A1C
Est. average glucose Bld gHb Est-mCnc: 143 mg/dL
Hgb A1c MFr Bld: 6.6 % — ABNORMAL HIGH (ref 4.8–5.6)

## 2022-06-02 ENCOUNTER — Other Ambulatory Visit: Payer: Self-pay | Admitting: Cardiology

## 2022-06-02 ENCOUNTER — Ambulatory Visit (INDEPENDENT_AMBULATORY_CARE_PROVIDER_SITE_OTHER): Payer: Medicare Other | Admitting: Internal Medicine

## 2022-06-02 ENCOUNTER — Encounter: Payer: Self-pay | Admitting: Internal Medicine

## 2022-06-02 VITALS — BP 120/74 | HR 87 | Resp 16 | Ht 61.0 in | Wt 191.2 lb

## 2022-06-02 DIAGNOSIS — J0110 Acute frontal sinusitis, unspecified: Secondary | ICD-10-CM

## 2022-06-02 DIAGNOSIS — Z90710 Acquired absence of both cervix and uterus: Secondary | ICD-10-CM | POA: Insufficient documentation

## 2022-06-02 DIAGNOSIS — I10 Essential (primary) hypertension: Secondary | ICD-10-CM

## 2022-06-02 DIAGNOSIS — E119 Type 2 diabetes mellitus without complications: Secondary | ICD-10-CM

## 2022-06-02 DIAGNOSIS — E1151 Type 2 diabetes mellitus with diabetic peripheral angiopathy without gangrene: Secondary | ICD-10-CM

## 2022-06-02 DIAGNOSIS — F5101 Primary insomnia: Secondary | ICD-10-CM

## 2022-06-02 DIAGNOSIS — E1149 Type 2 diabetes mellitus with other diabetic neurological complication: Secondary | ICD-10-CM | POA: Diagnosis not present

## 2022-06-02 DIAGNOSIS — J449 Chronic obstructive pulmonary disease, unspecified: Secondary | ICD-10-CM | POA: Diagnosis not present

## 2022-06-02 DIAGNOSIS — L304 Erythema intertrigo: Secondary | ICD-10-CM

## 2022-06-02 DIAGNOSIS — Z720 Tobacco use: Secondary | ICD-10-CM

## 2022-06-02 DIAGNOSIS — E559 Vitamin D deficiency, unspecified: Secondary | ICD-10-CM

## 2022-06-02 MED ORDER — EMPAGLIFLOZIN 10 MG PO TABS
10.0000 mg | ORAL_TABLET | Freq: Every day | ORAL | 1 refills | Status: DC
Start: 2022-06-02 — End: 2022-11-06

## 2022-06-02 MED ORDER — VITAMIN D (ERGOCALCIFEROL) 1.25 MG (50000 UNIT) PO CAPS: 50000.0000 [IU] | ORAL_CAPSULE | ORAL | 1 refills | Status: DC

## 2022-06-02 MED ORDER — TRULICITY 1.5 MG/0.5ML ~~LOC~~ SOAJ: 1.5000 mg | SUBCUTANEOUS | 1 refills | Status: DC

## 2022-06-02 MED ORDER — ALBUTEROL SULFATE HFA 108 (90 BASE) MCG/ACT IN AERS: 2.0000 | INHALATION_SPRAY | RESPIRATORY_TRACT | 2 refills | Status: DC | PRN

## 2022-06-02 MED ORDER — UMECLIDINIUM BROMIDE 62.5 MCG/ACT IN AEPB: 1.0000 | INHALATION_SPRAY | Freq: Every day | RESPIRATORY_TRACT | 5 refills | Status: DC

## 2022-06-02 MED ORDER — BUPROPION HCL ER (XL) 150 MG PO TB24: 150.0000 mg | ORAL_TABLET | Freq: Every day | ORAL | 3 refills | Status: DC

## 2022-06-02 MED ORDER — GLIPIZIDE 5 MG PO TABS: 5.0000 mg | ORAL_TABLET | Freq: Two times a day (BID) | ORAL | 1 refills | Status: DC

## 2022-06-02 MED ORDER — GABAPENTIN 100 MG PO CAPS: 200.0000 mg | ORAL_CAPSULE | Freq: Every day | ORAL | 1 refills | Status: DC

## 2022-06-02 MED ORDER — AZITHROMYCIN 250 MG PO TABS: ORAL_TABLET | ORAL | 0 refills | Status: AC

## 2022-06-02 MED ORDER — KETOCONAZOLE 2 % EX CREA: 1.0000 | TOPICAL_CREAM | Freq: Every day | CUTANEOUS | 0 refills | Status: AC

## 2022-06-02 NOTE — Patient Instructions (Signed)
Please start taking Azithromycin as prescribed.  Please start taking Vitamin D as prescribed.  Please take Gabapentin 200 mg instead of 100 mg.  Please continue taking other medications as prescribed.  Please continue to follow low carb diet and perform moderate exercise/walking at least 150 mins/week.

## 2022-06-02 NOTE — Progress Notes (Signed)
Established Patient Office Visit  Subjective:  Patient ID: Lauren David, female    DOB: 08/12/1960  Age: 61 y.o. MRN: [id number]  CC:  Chief Complaint  Patient presents with   Follow-up    4 month follow up DM and HTN pt has had congestion sneezing runny nose since 10/26 grandson was sick has not taken covid test and not otc medication    HPI Lauren David is a 61 y.o. female with past medical history of CAD s/p CABG, HTN, COPD, type 2 DM, Osteopenia, chronic left hip pain and tobacco abuse who presents for f/u of her chronic medical conditions.  CAD s/p CABG: She has completed cardiac rehab. She denies any chest pain or dyspnea currently. She is taking Aspirin and statin currently.  HTN: BP is well-controlled. Takes medications regularly. Patient denies headache, dizziness, chest pain, dyspnea or palpitations.  Type 2 DM: She has been taking Glipizide, Trulicity and Jardiance. She states that her blood glucose ranges around 100-200, but does not check it regularly. Denies any episode of hypoglycemia. She denies any dysuria or hematuria. She c/o b/l feet pain, burning in nature. She also reports more pain upon walking.  She has noticed significant improvement in her foot pain with gabapentin.  She c/o insomnia, which is chronic. She denies any anhedonia, change in appetite or weight. She was on Ambien in the past. She did not get any relief from Trazodone.   She has cut down smoking and does not smoke everyday now. She has tried Chantix, but was not able to quit smoking.  She complains of nasal congestion, sneezing and cough for the last 1 week.  She denies any recent worsening of dyspnea or wheezing.  Denies any fever or chills.  She also reports rash in groin area, which is red and itching.  She has had recurrent rash in lower abdominal area and groin area.    Past Medical History:  Diagnosis Date   Anxiety    Asthma    CAD (coronary artery disease)    Multivessel disease  status post 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   Chronic back pain    COPD (chronic obstructive pulmonary disease) (HCC)    Depression    Essential hypertension    Headache    Hyperlipidemia    Myocardial infarction ([place])    Pneumonia    Type 2 diabetes mellitus ([place])     Past Surgical History:  Procedure Laterality Date   BREAST BIOPSY Left    COLONOSCOPY N/A 04/26/2018   Procedure: COLONOSCOPY;  Surgeon: Rogene Houston, MD;  [facility];  Service: Endoscopy;  Laterality: N/A;  1:15   CORONARY ARTERY BYPASS GRAFT N/A 05/21/2021   Procedure: CORONARY ARTERY BYPASS GRAFTING (CABG) X4 ON PUMP USING LEFT INTERNAL MAMMARY ARTERY AND ENDOSCOPICALLY HARVESTED RIGHT GREATER SAPHENOUS VEIN;  Surgeon: Lajuana Matte, MD;  Location: [place];  Service: Open Heart Surgery;  Laterality: N/A;   ENDOVEIN HARVEST OF GREATER SAPHENOUS VEIN Right 05/21/2021   Procedure: ENDOVEIN HARVEST OF GREATER SAPHENOUS VEIN;  Surgeon: Lajuana Matte, MD;  Location: [place];  Service: Open Heart Surgery;  Laterality: Right;   INTRAVASCULAR PRESSURE WIRE/FFR STUDY N/A 05/12/2018   Procedure: INTRAVASCULAR PRESSURE WIRE/FFR STUDY;  Surgeon: Nelva Bush, MD;  Location: [place] [facility];  Service: Cardiovascular;  Laterality: N/A;   INTRAVASCULAR PRESSURE WIRE/FFR STUDY N/A 04/22/2021   Procedure: INTRAVASCULAR PRESSURE WIRE/FFR STUDY;  Surgeon: Nelva Bush, MD;  Location: [place] [facility];  Service: Cardiovascular;  Laterality: N/A;   LEFT HEART CATH AND CORONARY ANGIOGRAPHY N/A 05/12/2018   Procedure: LEFT HEART CATH AND CORONARY ANGIOGRAPHY;  Surgeon: Nelva Bush, MD;  Location: [place] [facility];  Service: Cardiovascular;  Laterality: N/A;   LEFT HEART CATH AND CORONARY ANGIOGRAPHY N/A 04/22/2021   Procedure: LEFT HEART CATH AND CORONARY ANGIOGRAPHY;  Surgeon: Nelva Bush, MD;  Location: [place] [facility];  Service: Cardiovascular;  Laterality: N/A;   POLYPECTOMY  04/26/2018   Procedure:  POLYPECTOMY;  Surgeon: Rogene Houston, MD;  [facility];  Service: Endoscopy;;  colon    RADIAL ARTERY HARVEST Left 05/21/2021   Procedure: RADIAL ARTERY HARVEST;  Surgeon: Lajuana Matte, MD;  Location: [place];  Service: Open Heart Surgery;  Laterality: Left;   TEE WITHOUT CARDIOVERSION N/A 05/21/2021   Procedure: TRANSESOPHAGEAL ECHOCARDIOGRAM (TEE);  Surgeon: Lajuana Matte, MD;  Location: [place];  Service: Open Heart Surgery;  Laterality: N/A;   TOTAL VAGINAL HYSTERECTOMY      Family History  Problem Relation Age of Onset   Stroke Father    Hypertension Father    Breast cancer Paternal Aunt    Breast cancer Paternal Aunt    Breast cancer Paternal Grandmother     Social History   Socioeconomic History   Marital status: Married    Spouse name: Not on file   Number of children: Not on file   Years of education: Not on file   Highest education level: Not on file  Occupational History   Not on file  Tobacco Use   Smoking status: Former    Packs/day: 1.00    Types: Cigarettes   Smokeless tobacco: Never   Tobacco comments:    cut back to 1 pack a day. Smokig x 48yr. Did smoke 2 packs x 5 yrs, but cut back a month ago.   Vaping Use   Vaping Use: Never used  Substance and Sexual Activity   Alcohol use: Never   Drug use: Never   Sexual activity: Not on file  Other Topics Concern   Not on file  Social History Narrative   Not on file   Social Determinants of Health   Financial Resource Strain: Low Risk  (04/08/2022)   Overall Financial Resource Strain (CARDIA)    Difficulty of Paying Living Expenses: Not hard at all  Food Insecurity: No Food Insecurity (04/08/2022)   Hunger Vital Sign    Worried About Running Out of Food in the Last Year: Never true    Ran Out of Food in the Last Year: Never true  Transportation Needs: No Transportation Needs (03/11/2021)   PRAPARE - THydrologist(Medical): No    Lack of Transportation  (Non-Medical): No  Physical Activity: Insufficiently Active (04/08/2022)   Exercise Vital Sign    Days of Exercise per Week: 3 days    Minutes of Exercise per Session: 30 min  Stress: No Stress Concern Present (04/08/2022)   [place]   Feeling of Stress : Not at all  Social Connections: Moderately Isolated (04/08/2022)   Social Connection and Isolation Panel [NHANES]    Frequency of Communication with Friends and Family: More than three times a week    Frequency of Social Gatherings with Friends and Family: More than three times a week    Attends Religious Services: More than 4 times per year    Active Member of [employer]  or Organizations: No    Attends Archivist Meetings: Never    Marital Status: Separated  Intimate Partner Violence: Not At Risk (04/08/2022)   Humiliation, Afraid, Rape, and Kick questionnaire    Fear of Current or Ex-Partner: No    Emotionally Abused: No    Physically Abused: No    Sexually Abused: No    Outpatient Medications Prior to Visit  Medication Sig Dispense Refill   acetaminophen (TYLENOL) 500 MG tablet Take 1,000 mg by mouth every 6 (six) hours as needed for moderate pain or headache.     albuterol (PROVENTIL) (2.5 MG/3ML) 0.083% nebulizer solution ONE VIAL BY NEBULIZATION EVERY 6 HOURS AS NEEDED FOR WHEEZING OR SHORTNESS OF BREATH. (Patient taking differently: Take 2.5 mg by nebulization every 6 (six) hours as needed for wheezing.) 180 mL 0   amLODipine (NORVASC) 2.5 MG tablet TAKE 1 TABLET ONCE DAILY. 30 tablet 5   aspirin EC 81 MG tablet Take 81 mg by mouth daily. Swallow whole.     blood glucose meter kit and supplies Dispense based on patient and insurance preference. Use up to four times daily as directed. (FOR ICD-10 E10.9, E11.9). 1 each 0   nicotine (NICODERM CQ - DOSED IN MG/24 HOURS) 14 mg/24hr patch Place 1 patch (14 mg total) onto the skin daily. 28 patch 1   rosuvastatin  (CRESTOR) 20 MG tablet TAKE 1 TABLET ONCE DAILY. 30 tablet 6   albuterol (VENTOLIN HFA) 108 (90 Base) MCG/ACT inhaler Inhale 2 puffs into the lungs every 4 (four) hours as needed for wheezing or shortness of breath. 17 g 0   gabapentin (NEURONTIN) 100 MG capsule Take 1 capsule (100 mg total) by mouth at bedtime. 90 capsule 1   glipiZIDE (GLUCOTROL) 5 MG tablet TAKE 1 TABLET BY MOUTH TWICE DAILY BEFORE A MEAL. 60 tablet 0   JARDIANCE 10 MG TABS tablet TAKE 1 TABLET ONCE DAILY BEFORE BREAKFAST. 30 tablet 0   metoprolol tartrate (LOPRESSOR) 25 MG tablet TAKE (1/2) TABLET BY MOUTH 2 TIMES A DAY. 30 tablet 6   TRULICITY 1.5 YT/0.3TW SOPN INJECT 1.5 MG INTO THE SKIN ONCE A WEEK. 2 mL 0   umeclidinium bromide (INCRUSE ELLIPTA) 62.5 MCG/INH AEPB Inhale 1 puff into the lungs daily. 30 each 2   varenicline (CHANTIX) 1 MG tablet TAKE (1) TABLET TWICE DAILY. 60 tablet 0   No facility-administered medications prior to visit.    Allergies  Allergen Reactions   Percocet [Oxycodone-Acetaminophen] Rash    ROS Review of Systems  Constitutional:  Negative for chills and fever.  HENT:  Positive for congestion, postnasal drip and sinus pressure. Negative for sinus pain.   Eyes:  Negative for pain and discharge.  Respiratory:  Positive for cough. Negative for shortness of breath and wheezing.   Cardiovascular:  Negative for chest pain and palpitations.  Gastrointestinal:  Negative for abdominal pain, diarrhea, nausea and vomiting.  Endocrine: Negative for polydipsia and polyuria.  Genitourinary:  Negative for dysuria and hematuria.  Musculoskeletal:  Positive for arthralgias and back pain. Negative for neck pain and neck stiffness.  Skin:  Positive for rash.  Neurological:  Negative for dizziness and weakness.  Psychiatric/Behavioral:  Positive for sleep disturbance. Negative for agitation and behavioral problems.       Objective:    Physical Exam Vitals reviewed.  Constitutional:      General: She  is not in acute distress.    Appearance: She is not diaphoretic.  HENT:  Head: Normocephalic and atraumatic.     Nose:     Right Sinus: Frontal sinus tenderness present.     Left Sinus: Frontal sinus tenderness present.     Mouth/Throat:     Mouth: Mucous membranes are moist.  Eyes:     General: No scleral icterus.    Extraocular Movements: Extraocular movements intact.  Cardiovascular:     Rate and Rhythm: Normal rate and regular rhythm.     Heart sounds: Normal heart sounds. No murmur heard. Pulmonary:     Breath sounds: Normal breath sounds. No wheezing or rales.  Musculoskeletal:     Cervical back: Neck supple. No tenderness.     Right lower leg: No edema.     Left lower leg: No edema.  Skin:    General: Skin is warm.     Findings: Rash (Erythematous patches in left groin area) present.  Neurological:     General: No focal deficit present.     Mental Status: She is alert and oriented to person, place, and time.  Psychiatric:        Mood and Affect: Mood normal.        Behavior: Behavior normal.     BP 120/74 (BP Location: Right Arm, Patient Position: Sitting, Cuff Size: Normal)   Pulse 87   Resp 16   Ht _0  (1.549 m)   Wt 191 lb 3.2 oz (86.7 kg)   SpO2 100%   BMI 36.13 kg/m  Wt Readings from Last 3 Encounters:  06/02/22 191 lb 3.2 oz (86.7 kg)  03/24/22 191 lb 3.2 oz (86.7 kg)  01/27/22 187 lb 12.8 oz (85.2 kg)    Lab Results  Component Value Date   TSH 1.380 01/24/2021   Lab Results  Component Value Date   WBC 10.9 (H) 05/28/2022   HGB 13.2 05/28/2022   HCT 42.3 05/28/2022   MCV 89 05/28/2022   PLT 252 05/28/2022   Lab Results  Component Value Date   NA 141 05/28/2022   K 4.0 05/28/2022   CO2 23 05/28/2022   GLUCOSE 106 (H) 05/28/2022   BUN 7 (L) 05/28/2022   CREATININE 0.61 05/28/2022   BILITOT <0.2 05/28/2022   ALKPHOS 82 05/28/2022   AST 17 05/28/2022   ALT 17 05/28/2022   PROT 6.6 05/28/2022   ALBUMIN 4.0 05/28/2022   CALCIUM 9.1  05/28/2022   ANIONGAP 9 05/24/2021   EGFR 101 05/28/2022   Lab Results  Component Value Date   CHOL 125 05/28/2022   Lab Results  Component Value Date   HDL 52 05/28/2022   Lab Results  Component Value Date   LDLCALC 56 05/28/2022   Lab Results  Component Value Date   TRIG 86 05/28/2022   Lab Results  Component Value Date   CHOLHDL 2.4 05/28/2022   Lab Results  Component Value Date   HGBA1C 6.6 (H) 05/28/2022      Assessment & Plan:   Problem List Items Addressed This Visit       Cardiovascular and Mediastinum   Primary hypertension    BP Readings from Last 1 Encounters:  06/02/22 120/74  Well-controlled with amlodipine, metoprolol Counseled for compliance with the medications Advised DASH diet and walking as tolerated      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) - Primary    Lab Results  Component Value Date   HGBA1C 6.6 (H) 05/28/2022  Well-controlled with Trulicity 1.5 mg qw, Glipizide 5 mg BID and Jardiance  10 mg daily Does not tolerate Metformin, 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 - on Gabapentin Advised to follow diabetic diet F/u CMP and lipid panel Diabetic eye exam: Advised to follow up with Ophthalmology for diabetic eye exam      Relevant Medications   empagliflozin (JARDIANCE) 10 MG TABS tablet   Dulaglutide (TRULICITY) 1.5 ZM/6.2HU SOPN   glipiZIDE (GLUCOTROL) 5 MG tablet     Respiratory   Chronic obstructive pulmonary disease (HCC)    Well-controlled with Incruse and PRN Albuterol Has been trying to quit smoking, has tried Chantix, switched to Wellbutrin - may help with anxiety and insomnia as well      Relevant Medications   azithromycin (ZITHROMAX) 250 MG tablet   albuterol (VENTOLIN HFA) 108 (90 Base) MCG/ACT inhaler   umeclidinium bromide (INCRUSE ELLIPTA) 62.5 MCG/ACT AEPB     Endocrine   Type 2 diabetes mellitus with neurological complications (HCC)    Foot pain improved with gabapentin 100 mg  qHS      Relevant Medications   empagliflozin (JARDIANCE) 10 MG TABS tablet   Dulaglutide (TRULICITY) 1.5 TM/5.4YT SOPN   gabapentin (NEURONTIN) 100 MG capsule   glipiZIDE (GLUCOTROL) 5 MG tablet     Other   Tobacco abuse    Smokes intermittently now  Asked about quitting: confirms that she currently smokes cigarettes Advise to quit smoking: Educated about QUITTING to reduce the risk of cancer, cardio and cerebrovascular disease. Assess willingness: Unwilling to quit at this time, but is working on cutting back. Assist with counseling and pharmacotherapy: Counseled for 5 minutes and literature provided.  Wellbutrin prescribed. Arrange for follow up: Follow up in 3 months and continue to offer help.      Relevant Medications   buPROPion (WELLBUTRIN XL) 150 MG 24 hr tablet   Primary insomnia    Had started Trazodone, but she did not get relief She asked for Elavil, but considering her cardiac condition, would avoid Elavil for now as it has QT prolonging action Started with Wellbutrin for smoking cessation, can help with GAD and insomnia      H/O total hysterectomy   Other Visit Diagnoses     Vitamin D deficiency       Relevant Medications   Vitamin D, Ergocalciferol, (DRISDOL) 1.25 MG (50000 UNIT) CAPS capsule   Acute non-recurrent frontal sinusitis    Started empiric azithromycin Advised to use nasal saline spray as needed for nasal congestion Mucinex or Robitussin as needed for cough    Relevant Medications   azithromycin (ZITHROMAX) 250 MG tablet   Intertrigo       Relevant Medications   ketoconazole (NIZORAL) 2 % cream       Meds ordered this encounter  Medications   empagliflozin (JARDIANCE) 10 MG TABS tablet    Sig: Take 1 tablet (10 mg total) by mouth daily.    Dispense:  90 tablet    Refill:  1   Dulaglutide (TRULICITY) 1.5 KP/5.4SF SOPN    Sig: Inject 1.5 mg into the skin once a week.    Dispense:  6 mL    Refill:  1   gabapentin (NEURONTIN) 100 MG  capsule    Sig: Take 2 capsules (200 mg total) by mouth at bedtime.    Dispense:  180 capsule    Refill:  1    Dose change   glipiZIDE (GLUCOTROL) 5 MG tablet    Sig: Take 1 tablet (5 mg total) by mouth 2 (two) times daily before a meal.  Dispense:  180 tablet    Refill:  1   buPROPion (WELLBUTRIN XL) 150 MG 24 hr tablet    Sig: Take 1 tablet (150 mg total) by mouth daily.    Dispense:  30 tablet    Refill:  3   Vitamin D, Ergocalciferol, (DRISDOL) 1.25 MG (50000 UNIT) CAPS capsule    Sig: Take 1 capsule (50,000 Units total) by mouth every 7 (seven) days.    Dispense:  12 capsule    Refill:  1   azithromycin (ZITHROMAX) 250 MG tablet    Sig: Take 2 tablets on day 1, then 1 tablet daily on days 2 through 5    Dispense:  6 tablet    Refill:  0   albuterol (VENTOLIN HFA) 108 (90 Base) MCG/ACT inhaler    Sig: Inhale 2 puffs into the lungs every 4 (four) hours as needed for wheezing or shortness of breath.    Dispense:  18 g    Refill:  2   umeclidinium bromide (INCRUSE ELLIPTA) 62.5 MCG/ACT AEPB    Sig: Inhale 1 puff into the lungs daily.    Dispense:  30 each    Refill:  5   ketoconazole (NIZORAL) 2 % cream    Sig: Apply 1 Application topically daily.    Dispense:  15 g    Refill:  0    Follow-up: Return in about 4 months (around 10/02/2022) for DM and HLD.    Lindell Spar, MD

## 2022-06-06 NOTE — Assessment & Plan Note (Addendum)
Well-controlled with Incruse and PRN Albuterol Has been trying to quit smoking, has tried Chantix, switched to Wellbutrin - may help with anxiety and insomnia as well

## 2022-06-06 NOTE — Assessment & Plan Note (Addendum)
BP Readings from Last 1 Encounters:  06/02/22 120/74   Well-controlled with amlodipine, metoprolol Counseled for compliance with the medications Advised DASH diet and walking as tolerated

## 2022-06-06 NOTE — Assessment & Plan Note (Signed)
Had started Trazodone, but she did not get relief She asked for Elavil, but considering her cardiac condition, would avoid Elavil for now as it has QT prolonging action Started with Wellbutrin for smoking cessation, can help with GAD and insomnia

## 2022-06-06 NOTE — Assessment & Plan Note (Addendum)
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

## 2022-06-06 NOTE — Assessment & Plan Note (Signed)
Smokes intermittently now  Asked about quitting: confirms that she currently smokes cigarettes Advise to quit smoking: Educated about QUITTING to reduce the risk of cancer, cardio and cerebrovascular disease. Assess willingness: Unwilling to quit at this time, but is working on cutting back. Assist with counseling and pharmacotherapy: Counseled for 5 minutes and literature provided.  Wellbutrin prescribed. Arrange for follow up: Follow up in 3 months and continue to offer help.

## 2022-06-06 NOTE — Assessment & Plan Note (Signed)
Foot pain improved with gabapentin 100 mg qHS

## 2022-07-07 ENCOUNTER — Ambulatory Visit (INDEPENDENT_AMBULATORY_CARE_PROVIDER_SITE_OTHER): Payer: Medicare Other | Admitting: Internal Medicine

## 2022-07-07 ENCOUNTER — Encounter: Payer: Self-pay | Admitting: Internal Medicine

## 2022-07-07 DIAGNOSIS — U071 COVID-19: Secondary | ICD-10-CM | POA: Diagnosis not present

## 2022-07-07 MED ORDER — NIRMATRELVIR/RITONAVIR (PAXLOVID)TABLET: 3.0000 | ORAL_TABLET | Freq: Two times a day (BID) | ORAL | 0 refills | Status: AC

## 2022-07-07 NOTE — Progress Notes (Signed)
     This is a telephone encounter between Lauren David and [name] on 07/07/2022 for Covid positive. The visit was conducted with the patient located at home and [name] at [facility]. The patient's identity was confirmed using their DOB and current address. The patient has consented to being evaluated through a telephone encounter and understands the associated risks (an examination cannot be done and the patient may need to come in for an appointment) / benefits (allows the patient to remain at home, decreasing exposure to coronavirus).    CC: Covid 19 positive test  HPI:Lauren David is a 61 y.o. female who presents for evaluation of Covid 55. For the details of today's visit, please refer to the assessment and plan.  Past Medical History:  Diagnosis Date   Anxiety    Asthma    CAD (coronary artery disease)    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   Chronic back pain    COPD (chronic obstructive pulmonary disease) (HCC)    Depression    Essential hypertension    Headache    Hyperlipidemia    Myocardial infarction ([place])    Pneumonia    Type 2 diabetes mellitus ([place])       Assessment & Plan:   COVID-19 Patient started having headache last week and tested negative on Wednesday. Her grandson lives with her and had Covid last week. She tested again on Friday and the test was positive. She has malaise , dizziness, and weakness. Dizziness comes on with standing. She has not taken any of her medications today because she has felt ill. Marland KitchenHer blood glucose was 150 yesterday. Pulse ox shows 95% at home. She can not check her own blood pressure at home.  No nausea vomiting or diarrhea.   Assessment/Plan: Mild to moderate Covid 19 infection, patient has multiple comorbidity. Normal renal function on review of BMP from October. Ran medications in [employer]. Patient will hold Crestor until finishing Paxlovid. In addition , will continue to hold blood pressure medications  given descriptions of orthostatic hypotension. Her grandsons are arranging to be home 24/7 with her. Given follow up precautions.   - nirmatrelvir/ritonavir EUA (PAXLOVID) 20 x 150 MG & 10 x '100MG'$  TABS; Take 3 tablets by mouth 2 (two) times daily for 5 days. (Take nirmatrelvir 150 mg two tablets twice daily for 5 days and ritonavir 100 mg one tablet twice daily for 5 days) Patient GFR is 99 ml/min.  Dispense: 30 tablet; Refill: 0   Time:   Today, I have spent 15 minutes with the patient with telehealth technology discussing the above problems.  [name], MD

## 2022-07-07 NOTE — Patient Instructions (Signed)
Thank you for trusting me with your care. To recap, today we discussed the following:   COVID-19 - Hold Crestor because it could have interaction with Paxlovid. Hold your blood pressure medicine because of dizziness with standing. Resume medications after finishing Paxlovid and dizziness goes away. If you continue to have dizziness or symptoms worsen I recommend going to ED . - nirmatrelvir/ritonavir EUA (PAXLOVID) 20 x 150 MG & 10 x '100MG'$  TABS; Take 3 tablets by mouth 2 (two) times daily for 5 days. (Take nirmatrelvir 150 mg two tablets twice daily for 5 days and ritonavir 100 mg one tablet twice daily for 5 days) Patient GFR is 99 ml/min.  Dispense: 30 tablet; Refill: 0

## 2022-07-07 NOTE — Assessment & Plan Note (Addendum)
Patient started having headache last week and tested negative on Wednesday. Her grandson lives with her and had Covid last week. She tested again on Friday and the test was positive. She has malaise , dizziness, and weakness. Dizziness comes on with standing. She has not taken any of her medications today because she has felt ill. Marland KitchenHer blood glucose was 150 yesterday. Pulse ox shows 95% at home. She can not check her own blood pressure at home.  No nausea vomiting or diarrhea.   Assessment/Plan: Mild to moderate Covid 19 infection, patient has multiple comorbidity. Normal renal function on review of BMP from October. Ran medications in [employer]. Patient will hold Crestor until finishing Paxlovid. In addition , will continue to hold blood pressure medications given descriptions of orthostatic hypotension. Her grandsons are arranging to be home 24/7 with her. Given follow up precautions.   - nirmatrelvir/ritonavir EUA (PAXLOVID) 20 x 150 MG & 10 x '100MG'$  TABS; Take 3 tablets by mouth 2 (two) times daily for 5 days. (Take nirmatrelvir 150 mg two tablets twice daily for 5 days and ritonavir 100 mg one tablet twice daily for 5 days) Patient GFR is 99 ml/min.  Dispense: 30 tablet; Refill: 0

## 2022-10-02 ENCOUNTER — Ambulatory Visit: Payer: Medicare Other | Admitting: Cardiology

## 2022-10-03 ENCOUNTER — Ambulatory Visit: Payer: Medicare Other | Admitting: Internal Medicine

## 2022-10-31 ENCOUNTER — Ambulatory Visit: Payer: Medicare Other | Admitting: Nurse Practitioner

## 2022-11-06 ENCOUNTER — Ambulatory Visit (INDEPENDENT_AMBULATORY_CARE_PROVIDER_SITE_OTHER): Payer: Medicare Other | Admitting: Internal Medicine

## 2022-11-06 ENCOUNTER — Encounter: Payer: Self-pay | Admitting: Internal Medicine

## 2022-11-06 VITALS — BP 142/72 | HR 78 | Ht 61.0 in | Wt 166.0 lb

## 2022-11-06 DIAGNOSIS — Z951 Presence of aortocoronary bypass graft: Secondary | ICD-10-CM

## 2022-11-06 DIAGNOSIS — F17219 Nicotine dependence, cigarettes, with unspecified nicotine-induced disorders: Secondary | ICD-10-CM

## 2022-11-06 DIAGNOSIS — E1151 Type 2 diabetes mellitus with diabetic peripheral angiopathy without gangrene: Secondary | ICD-10-CM | POA: Diagnosis not present

## 2022-11-06 DIAGNOSIS — F17210 Nicotine dependence, cigarettes, uncomplicated: Secondary | ICD-10-CM

## 2022-11-06 DIAGNOSIS — J449 Chronic obstructive pulmonary disease, unspecified: Secondary | ICD-10-CM | POA: Diagnosis not present

## 2022-11-06 DIAGNOSIS — E1149 Type 2 diabetes mellitus with other diabetic neurological complication: Secondary | ICD-10-CM

## 2022-11-06 DIAGNOSIS — I10 Essential (primary) hypertension: Secondary | ICD-10-CM

## 2022-11-06 DIAGNOSIS — G8929 Other chronic pain: Secondary | ICD-10-CM

## 2022-11-06 DIAGNOSIS — E782 Mixed hyperlipidemia: Secondary | ICD-10-CM

## 2022-11-06 DIAGNOSIS — M25511 Pain in right shoulder: Secondary | ICD-10-CM

## 2022-11-06 LAB — POCT GLYCOSYLATED HEMOGLOBIN (HGB A1C): HbA1c, POC (controlled diabetic range): 14.1 % — AB (ref 0.0–7.0)

## 2022-11-06 MED ORDER — ROSUVASTATIN CALCIUM 20 MG PO TABS: 20.0000 mg | ORAL_TABLET | Freq: Every day | ORAL | 5 refills | Status: DC

## 2022-11-06 MED ORDER — TRULICITY 1.5 MG/0.5ML ~~LOC~~ SOAJ: 1.5000 mg | SUBCUTANEOUS | 1 refills | Status: DC

## 2022-11-06 MED ORDER — EMPAGLIFLOZIN 10 MG PO TABS: 10.0000 mg | ORAL_TABLET | Freq: Every day | ORAL | 1 refills | Status: DC

## 2022-11-06 MED ORDER — GLIPIZIDE 5 MG PO TABS: 5.0000 mg | ORAL_TABLET | Freq: Two times a day (BID) | ORAL | 1 refills | Status: DC

## 2022-11-06 MED ORDER — CELECOXIB 200 MG PO CAPS: 200.0000 mg | ORAL_CAPSULE | Freq: Every day | ORAL | 0 refills | Status: DC | PRN

## 2022-11-06 MED ORDER — UMECLIDINIUM BROMIDE 62.5 MCG/ACT IN AEPB: 1.0000 | INHALATION_SPRAY | Freq: Every day | RESPIRATORY_TRACT | 5 refills | Status: DC

## 2022-11-06 MED ORDER — ALBUTEROL SULFATE HFA 108 (90 BASE) MCG/ACT IN AERS: 2.0000 | INHALATION_SPRAY | RESPIRATORY_TRACT | 2 refills | Status: DC | PRN

## 2022-11-06 NOTE — Patient Instructions (Signed)
Please start taking Metoprolol 1/2 tablet twice daily.  Please start taking Trulicity, Jardiance and Glipizide for diabetes. Please check blood glucose before meals and bring the log in the next visit.  Please start taking Rosuvastatin for cholesterol.  Please take Celecoxib for right shoulder pain. Okay to apply ice over the area.  Please follow low carb diet and ambulate as tolerated.

## 2022-11-06 NOTE — Assessment & Plan Note (Signed)
On Crestor, needs to remain compliant

## 2022-11-06 NOTE — Assessment & Plan Note (Signed)
Smokes about 0.5 pack/day  Asked about quitting: confirms that he/she currently smokes cigarettes Advise to quit smoking: Educated about QUITTING to reduce the risk of cancer, cardio and cerebrovascular disease. Assess willingness: Unwilling to quit at this time, but is working on cutting back. Assist with counseling and pharmacotherapy: Counseled for 5 minutes and literature provided. Arrange for follow up: follow up in 3 months and continue to offer help.    Has > 20-pack-year smoking history Ordered low-dose CT chest after discussing with the patient.

## 2022-11-06 NOTE — Assessment & Plan Note (Signed)
Foot pain improved with gabapentin 100 mg qHS 

## 2022-11-06 NOTE — Assessment & Plan Note (Addendum)
BP Readings from Last 1 Encounters:  11/06/22 (!) 142/72   Uncontrolled, was on amlodipine, metoprolol Start metoprolol 12.5 mg BID again, DC amlodipine Counseled for compliance with the medications Advised DASH diet and walking as tolerated

## 2022-11-06 NOTE — Progress Notes (Signed)
Established Patient Office Visit  Subjective:  Patient ID: Lauren David, female    DOB: 10-11-1960  Age: 62 y.o. MRN: [id number]  CC:  Chief Complaint  Patient presents with   Diabetes    Four month follow up for diabetes and HLD. Patient states she is having right shoulder and arm pain     HPI Lauren David is a 62 y.o. female with past medical history of CAD s/p CABG, HTN, COPD, type 2 DM, Osteopenia, chronic left hip pain and tobacco abuse who presents for f/u of her chronic medical conditions.  She has stopped taking all of her medications since she had COVID.  CAD s/p CABG: She has completed cardiac rehab. She denies any chest pain or dyspnea currently. She was taking Aspirin and statin currently.  HTN: BP is uncontrolled. Patient denies headache, dizziness, chest pain, dyspnea or palpitations.  Type 2 DM: She had been taking Glipizide, Trulicity and Jardiance. She does not check her blood glucose. Her HbA1c is 14.1 now, from 6.6. Denies any episode of hypoglycemia. She denies any dysuria or hematuria. She c/o b/l feet pain, burning in nature. She also reports more pain upon walking.  She has noticed significant improvement in her foot pain with gabapentin.   She has cut down smoking and does not smoke everyday now. She has tried Chantix and Wellbutrin, but was not able to quit smoking.  She complains of right shoulder and upper arm pain since 07/04/22.  Her pain is constant, worse with movement and better with resting ibuprofen.  She denies any recent injury or fall.  Denies any numbness or tingling of the hand.    Past Medical History:  Diagnosis Date   Anxiety    Asthma    CAD (coronary artery disease)    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   Chronic back pain    COPD (chronic obstructive pulmonary disease)    Depression    Essential hypertension    Headache    Hyperlipidemia    Myocardial infarction    Pneumonia    Type 2 diabetes mellitus      Past Surgical History:  Procedure Laterality Date   BREAST BIOPSY Left    COLONOSCOPY N/A 04/26/2018   Procedure: COLONOSCOPY;  Surgeon: Rogene Houston, MD;  [facility];  Service: Endoscopy;  Laterality: N/A;  1:15   CORONARY ARTERY BYPASS GRAFT N/A 05/21/2021   Procedure: CORONARY ARTERY BYPASS GRAFTING (CABG) X4 ON PUMP USING LEFT INTERNAL MAMMARY ARTERY AND ENDOSCOPICALLY HARVESTED RIGHT GREATER SAPHENOUS VEIN;  Surgeon: Lajuana Matte, MD;  Location: [place];  Service: Open Heart Surgery;  Laterality: N/A;   CORONARY PRESSURE/FFR STUDY N/A 05/12/2018   Procedure: INTRAVASCULAR PRESSURE WIRE/FFR STUDY;  Surgeon: Nelva Bush, MD;  Location: [place] [facility];  Service: Cardiovascular;  Laterality: N/A;   CORONARY PRESSURE/FFR STUDY N/A 04/22/2021   Procedure: INTRAVASCULAR PRESSURE WIRE/FFR STUDY;  Surgeon: Nelva Bush, MD;  Location: [place] [facility];  Service: Cardiovascular;  Laterality: N/A;   ENDOVEIN HARVEST OF GREATER SAPHENOUS VEIN Right 05/21/2021   Procedure: ENDOVEIN HARVEST OF GREATER SAPHENOUS VEIN;  Surgeon: Lajuana Matte, MD;  Location: [place];  Service: Open Heart Surgery;  Laterality: Right;   LEFT HEART CATH AND CORONARY ANGIOGRAPHY N/A 05/12/2018   Procedure: LEFT HEART CATH AND CORONARY ANGIOGRAPHY;  Surgeon: Nelva Bush, MD;  Location: [place] [facility];  Service: Cardiovascular;  Laterality: N/A;   LEFT HEART CATH AND CORONARY  ANGIOGRAPHY N/A 04/22/2021   Procedure: LEFT HEART CATH AND CORONARY ANGIOGRAPHY;  Surgeon: Nelva Bush, MD;  Location: [place] [facility];  Service: Cardiovascular;  Laterality: N/A;   POLYPECTOMY  04/26/2018   Procedure: POLYPECTOMY;  Surgeon: Rogene Houston, MD;  [facility];  Service: Endoscopy;;  colon    RADIAL ARTERY HARVEST Left 05/21/2021   Procedure: RADIAL ARTERY HARVEST;  Surgeon: Lajuana Matte, MD;  Location: [place];  Service: Open Heart Surgery;  Laterality: Left;    TEE WITHOUT CARDIOVERSION N/A 05/21/2021   Procedure: TRANSESOPHAGEAL ECHOCARDIOGRAM (TEE);  Surgeon: Lajuana Matte, MD;  Location: [place];  Service: Open Heart Surgery;  Laterality: N/A;   TOTAL VAGINAL HYSTERECTOMY      Family History  Problem Relation Age of Onset   Stroke Father    Hypertension Father    Breast cancer Paternal Aunt    Breast cancer Paternal Aunt    Breast cancer Paternal Grandmother     Social History   Socioeconomic History   Marital status: Married    Spouse name: Not on file   Number of children: Not on file   Years of education: Not on file   Highest education level: Not on file  Occupational History   Not on file  Tobacco Use   Smoking status: Every Day    Packs/day: 1.00    Years: 40.00    Additional pack years: 0.00    Total pack years: 40.00    Types: Cigarettes   Smokeless tobacco: Never   Tobacco comments:    cut back to 1 pack a day. Smokig x 7yrs. Did smoke 2 packs x 5 yrs, but cut back a month ago.   Vaping Use   Vaping Use: Never used  Substance and Sexual Activity   Alcohol use: Never   Drug use: Never   Sexual activity: Not on file  Other Topics Concern   Not on file  Social History Narrative   Not on file   Social Determinants of Health   Financial Resource Strain: Low Risk  (04/08/2022)   Overall Financial Resource Strain (CARDIA)    Difficulty of Paying Living Expenses: Not hard at all  Food Insecurity: No Food Insecurity (04/08/2022)   Hunger Vital Sign    Worried About Running Out of Food in the Last Year: Never true    Ran Out of Food in the Last Year: Never true  Transportation Needs: No Transportation Needs (03/11/2021)   PRAPARE - Hydrologist (Medical): No    Lack of Transportation (Non-Medical): No  Physical Activity: Insufficiently Active (04/08/2022)   Exercise Vital Sign    Days of Exercise per Week: 3 days    Minutes of Exercise per Session: 30 min  Stress: No Stress Concern  Present (04/08/2022)   [place]    Feeling of Stress : Not at all  Social Connections: Moderately Isolated (04/08/2022)   Social Connection and Isolation Panel [NHANES]    Frequency of Communication with Friends and Family: More than three times a week    Frequency of Social Gatherings with Friends and Family: More than three times a week    Attends Religious Services: More than 4 times per year    Active Member of Clubs or Organizations: No    Attends Archivist Meetings: Never    Marital Status: Separated  Intimate Partner Violence: Not At Risk (04/08/2022)  Humiliation, Afraid, Rape, and Kick questionnaire    Fear of Current or Ex-Partner: No    Emotionally Abused: No    Physically Abused: No    Sexually Abused: No    Outpatient Medications Prior to Visit  Medication Sig Dispense Refill   acetaminophen (TYLENOL) 500 MG tablet Take 1,000 mg by mouth every 6 (six) hours as needed for moderate pain or headache.     albuterol (PROVENTIL) (2.5 MG/3ML) 0.083% nebulizer solution ONE VIAL BY NEBULIZATION EVERY 6 HOURS AS NEEDED FOR WHEEZING OR SHORTNESS OF BREATH. (Patient taking differently: Take 2.5 mg by nebulization every 6 (six) hours as needed for wheezing.) 180 mL 0   aspirin EC 81 MG tablet Take 81 mg by mouth daily. Swallow whole.     blood glucose meter kit and supplies Dispense based on patient and insurance preference. Use up to four times daily as directed. (FOR ICD-10 E10.9, E11.9). 1 each 0   gabapentin (NEURONTIN) 100 MG capsule Take 2 capsules (200 mg total) by mouth at bedtime. 180 capsule 1   ketoconazole (NIZORAL) 2 % cream Apply 1 Application topically daily. 15 g 0   metoprolol tartrate (LOPRESSOR) 25 MG tablet TAKE (1/2) TABLET BY MOUTH 2 TIMES A DAY. 90 tablet 1   Vitamin D, Ergocalciferol, (DRISDOL) 1.25 MG (50000 UNIT) CAPS capsule Take 1 capsule (50,000 Units total) by mouth every 7 (seven)  days. 12 capsule 1   albuterol (VENTOLIN HFA) 108 (90 Base) MCG/ACT inhaler Inhale 2 puffs into the lungs every 4 (four) hours as needed for wheezing or shortness of breath. 18 g 2   amLODipine (NORVASC) 2.5 MG tablet TAKE 1 TABLET ONCE DAILY. 30 tablet 5   buPROPion (WELLBUTRIN XL) 150 MG 24 hr tablet Take 1 tablet (150 mg total) by mouth daily. 30 tablet 3   Dulaglutide (TRULICITY) 1.5 0000000 SOPN Inject 1.5 mg into the skin once a week. 6 mL 1   empagliflozin (JARDIANCE) 10 MG TABS tablet Take 1 tablet (10 mg total) by mouth daily. 90 tablet 1   glipiZIDE (GLUCOTROL) 5 MG tablet Take 1 tablet (5 mg total) by mouth 2 (two) times daily before a meal. 180 tablet 1   nicotine (NICODERM CQ - DOSED IN MG/24 HOURS) 14 mg/24hr patch Place 1 patch (14 mg total) onto the skin daily. 28 patch 1   rosuvastatin (CRESTOR) 20 MG tablet TAKE 1 TABLET ONCE DAILY. 30 tablet 6   umeclidinium bromide (INCRUSE ELLIPTA) 62.5 MCG/ACT AEPB Inhale 1 puff into the lungs daily. 30 each 5   No facility-administered medications prior to visit.    Allergies  Allergen Reactions   Percocet [Oxycodone-Acetaminophen] Rash    ROS Review of Systems  Constitutional:  Negative for chills and fever.  HENT:  Negative for congestion, postnasal drip, sinus pressure and sinus pain.   Eyes:  Negative for pain and discharge.  Respiratory:  Positive for cough. Negative for shortness of breath and wheezing.   Cardiovascular:  Negative for chest pain and palpitations.  Gastrointestinal:  Negative for abdominal pain, diarrhea, nausea and vomiting.  Endocrine: Negative for polydipsia and polyuria.  Genitourinary:  Negative for dysuria and hematuria.  Musculoskeletal:  Positive for arthralgias and back pain. Negative for neck pain and neck stiffness.  Skin:  Negative for rash.  Neurological:  Negative for dizziness and weakness.  Psychiatric/Behavioral:  Positive for sleep disturbance. Negative for agitation and behavioral  problems.       Objective:    Physical Exam Vitals reviewed.  Constitutional:      General: She is not in acute distress.    Appearance: She is not diaphoretic.  HENT:     Head: Normocephalic and atraumatic.     Mouth/Throat:     Mouth: Mucous membranes are moist.  Eyes:     General: No scleral icterus.    Extraocular Movements: Extraocular movements intact.  Cardiovascular:     Rate and Rhythm: Normal rate and regular rhythm.     Heart sounds: Normal heart sounds. No murmur heard. Pulmonary:     Breath sounds: Normal breath sounds. No wheezing or rales.  Musculoskeletal:     Right shoulder: No swelling. Decreased range of motion.     Cervical back: Neck supple. No tenderness.     Right lower leg: No edema.     Left lower leg: No edema.  Skin:    General: Skin is warm.     Findings: No rash.  Neurological:     General: No focal deficit present.     Mental Status: She is alert and oriented to person, place, and time.  Psychiatric:        Mood and Affect: Mood normal.        Behavior: Behavior normal.     BP (!) 142/72 (BP Location: Right Arm, Patient Position: Sitting, Cuff Size: Normal)   Pulse 78   Ht 5\' 1"  (1.549 m)   Wt 166 lb (75.3 kg)   SpO2 97%   BMI 31.37 kg/m  Wt Readings from Last 3 Encounters:  11/06/22 166 lb (75.3 kg)  06/02/22 191 lb 3.2 oz (86.7 kg)  03/24/22 191 lb 3.2 oz (86.7 kg)    Lab Results  Component Value Date   TSH 1.380 01/24/2021   Lab Results  Component Value Date   WBC 10.9 (H) 05/28/2022   HGB 13.2 05/28/2022   HCT 42.3 05/28/2022   MCV 89 05/28/2022   PLT 252 05/28/2022   Lab Results  Component Value Date   NA 141 05/28/2022   K 4.0 05/28/2022   CO2 23 05/28/2022   GLUCOSE 106 (H) 05/28/2022   BUN 7 (L) 05/28/2022   CREATININE 0.61 05/28/2022   BILITOT <0.2 05/28/2022   ALKPHOS 82 05/28/2022   AST 17 05/28/2022   ALT 17 05/28/2022   PROT 6.6 05/28/2022   ALBUMIN 4.0 05/28/2022   CALCIUM 9.1 05/28/2022    ANIONGAP 9 05/24/2021   EGFR 101 05/28/2022   Lab Results  Component Value Date   CHOL 125 05/28/2022   Lab Results  Component Value Date   HDL 52 05/28/2022   Lab Results  Component Value Date   LDLCALC 56 05/28/2022   Lab Results  Component Value Date   TRIG 86 05/28/2022   Lab Results  Component Value Date   CHOLHDL 2.4 05/28/2022   Lab Results  Component Value Date   HGBA1C 14.1 (A) 11/06/2022      Assessment & Plan:   Problem List Items Addressed This Visit    Type 2 diabetes mellitus with neurological complications ([place]) Foot pain improved with gabapentin 100 mg qHS  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) Lab Results  Component Value Date   HGBA1C 14.1 (A) 11/06/2022   Uncontrolled due to noncompliance Was well-controlled with Trulicity 1.5 mg qw, Glipizide 5 mg BID and Jardiance 10 mg daily - restart old regimen Does not tolerate Metformin, 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 - on Gabapentin Advised to follow diabetic diet F/u CMP  and lipid panel Diabetic eye exam: Advised to follow up with Ophthalmology for diabetic eye exam  Primary hypertension BP Readings from Last 1 Encounters:  11/06/22 (!) 142/72   Uncontrolled, was on amlodipine, metoprolol Start metoprolol 12.5 mg BID again, DC amlodipine Counseled for compliance with the medications Advised DASH diet and walking as tolerated  Chronic obstructive pulmonary disease (HCC) Well-controlled with Incruse and PRN Albuterol, refilled Has been trying to quit smoking, has tried Chantix, switched to Wellbutrin, but did not help  Cigarette nicotine dependence with nicotine-induced disorder Smokes about 0.5 pack/day  Asked about quitting: confirms that he/she currently smokes cigarettes Advise to quit smoking: Educated about QUITTING to reduce the risk of cancer, cardio and cerebrovascular disease. Assess willingness: Unwilling to quit at this time, but is working on  cutting back. Assist with counseling and pharmacotherapy: Counseled for 5 minutes and literature provided. Arrange for follow up: follow up in 3 months and continue to offer help.    Has > 20-pack-year smoking history Ordered low-dose CT chest after discussing with the patient.  S/P CABG x 4 On aspirin and statin Restart Metoprolol Followed by thoracic surgery and cardiology No chest pain or dyspnea currently  Mixed hyperlipidemia On Crestor, needs to remain compliant  Chronic right shoulder pain Unclear etiology, could be tendinitis Celebrex as needed for pain, avoid oral and NSAIDs for long term due to h/o CAD Tylenol arthritis as needed for pain Check x-ray of right shoulder    Meds ordered this encounter  Medications   umeclidinium bromide (INCRUSE ELLIPTA) 62.5 MCG/ACT AEPB    Sig: Inhale 1 puff into the lungs daily.    Dispense:  30 each    Refill:  5   albuterol (VENTOLIN HFA) 108 (90 Base) MCG/ACT inhaler    Sig: Inhale 2 puffs into the lungs every 4 (four) hours as needed for wheezing or shortness of breath.    Dispense:  18 g    Refill:  2   celecoxib (CELEBREX) 200 MG capsule    Sig: Take 1 capsule (200 mg total) by mouth daily as needed for mild pain or moderate pain.    Dispense:  30 capsule    Refill:  0   rosuvastatin (CRESTOR) 20 MG tablet    Sig: Take 1 tablet (20 mg total) by mouth daily.    Dispense:  30 tablet    Refill:  5   glipiZIDE (GLUCOTROL) 5 MG tablet    Sig: Take 1 tablet (5 mg total) by mouth 2 (two) times daily before a meal.    Dispense:  180 tablet    Refill:  1   Dulaglutide (TRULICITY) 1.5 0000000 SOPN    Sig: Inject 1.5 mg into the skin once a week.    Dispense:  6 mL    Refill:  1   empagliflozin (JARDIANCE) 10 MG TABS tablet    Sig: Take 1 tablet (10 mg total) by mouth daily.    Dispense:  90 tablet    Refill:  1    Follow-up: Return in about 4 weeks (around 12/04/2022) for HTN and DM.    Lindell Spar, MD

## 2022-11-06 NOTE — Assessment & Plan Note (Addendum)
On aspirin and statin Restart Metoprolol Followed by thoracic surgery and cardiology No chest pain or dyspnea currently

## 2022-11-06 NOTE — Assessment & Plan Note (Signed)
Unclear etiology, could be tendinitis Celebrex as needed for pain, avoid oral and NSAIDs for long term due to h/o CAD Tylenol arthritis as needed for pain Check x-ray of right shoulder

## 2022-11-06 NOTE — Assessment & Plan Note (Addendum)
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

## 2022-11-06 NOTE — Assessment & Plan Note (Addendum)
Well-controlled with Incruse and PRN Albuterol, refilled Has been trying to quit smoking, has tried Chantix, switched to Wellbutrin, but did not help

## 2022-11-07 ENCOUNTER — Other Ambulatory Visit: Payer: Self-pay | Admitting: Internal Medicine

## 2022-11-07 DIAGNOSIS — E1151 Type 2 diabetes mellitus with diabetic peripheral angiopathy without gangrene: Secondary | ICD-10-CM

## 2022-11-07 LAB — CMP14+EGFR
ALT: 17 IU/L (ref 0–32)
AST: 14 IU/L (ref 0–40)
Albumin/Globulin Ratio: 1.6 (ref 1.2–2.2)
Albumin: 4.2 g/dL (ref 3.9–4.9)
Alkaline Phosphatase: 125 IU/L — ABNORMAL HIGH (ref 44–121)
BUN/Creatinine Ratio: 18 (ref 12–28)
BUN: 10 mg/dL (ref 8–27)
Bilirubin Total: 0.3 mg/dL (ref 0.0–1.2)
CO2: 23 mmol/L (ref 20–29)
Calcium: 9.7 mg/dL (ref 8.7–10.3)
Chloride: 96 mmol/L (ref 96–106)
Creatinine, Ser: 0.55 mg/dL — ABNORMAL LOW (ref 0.57–1.00)
Globulin, Total: 2.6 g/dL (ref 1.5–4.5)
Glucose: 375 mg/dL — ABNORMAL HIGH (ref 70–99)
Potassium: 4.6 mmol/L (ref 3.5–5.2)
Sodium: 136 mmol/L (ref 134–144)
Total Protein: 6.8 g/dL (ref 6.0–8.5)
eGFR: 103 mL/min/{1.73_m2} (ref >59)

## 2022-11-11 ENCOUNTER — Ambulatory Visit: Payer: Medicare Other | Attending: Cardiology | Admitting: Nurse Practitioner

## 2022-11-11 ENCOUNTER — Encounter: Payer: Self-pay | Admitting: Nurse Practitioner

## 2022-11-11 VITALS — BP 126/64 | HR 63 | Ht 61.0 in | Wt 168.2 lb

## 2022-11-11 DIAGNOSIS — I10 Essential (primary) hypertension: Secondary | ICD-10-CM | POA: Insufficient documentation

## 2022-11-11 DIAGNOSIS — J449 Chronic obstructive pulmonary disease, unspecified: Secondary | ICD-10-CM | POA: Insufficient documentation

## 2022-11-11 DIAGNOSIS — E785 Hyperlipidemia, unspecified: Secondary | ICD-10-CM | POA: Diagnosis not present

## 2022-11-11 DIAGNOSIS — I2510 Atherosclerotic heart disease of native coronary artery without angina pectoris: Secondary | ICD-10-CM | POA: Diagnosis not present

## 2022-11-11 MED ORDER — NITROGLYCERIN 0.4 MG SL SUBL: 0.4000 mg | SUBLINGUAL_TABLET | SUBLINGUAL | 1 refills | Status: DC | PRN

## 2022-11-11 MED ORDER — METOPROLOL TARTRATE 25 MG PO TABS: 12.5000 mg | ORAL_TABLET | Freq: Two times a day (BID) | ORAL | 1 refills | Status: DC

## 2022-11-11 NOTE — Patient Instructions (Addendum)
Medication Instructions:  Your physician has recommended you make the following change in your medication:  START nitroglycerin every 5 min x 3 doses as needed, IF NO RELIEF AFTER 3RD DOSE, GO TO ED. Continue all other medications as directed.  Labwork: none  Testing/Procedures: none  Follow-Up:  Your physician recommends that you schedule a follow-up appointment in: 1 year. You will receive a call in about 10 months reminding you to schedule your appointment. If you do not receive this call, please contact our office.  Any Other Special Instructions Will Be Listed Below (If Applicable).  If you need a refill on your cardiac medications before your next appointment, please call your pharmacy.

## 2022-11-11 NOTE — Progress Notes (Unsigned)
Office Visit    Patient Name: Lauren David Date of Encounter: 11/11/2022  PCP:  Anabel Halon, MD   [facility]  Cardiologist:  Nona Dell, MD *** Advanced Practice Provider:  No care team member to display Electrophysiologist:  None  {Press F2 to show EP APP, CHF, sleep or structural heart MD               :[id number]}  { Click here to update then REFRESH NOTE - MD (PCP) or APP (Team Member)  Change PCP Type for MD, Specialty for APP is either Cardiology or Clinical Cardiac Electrophysiology  :[id number]}  Chief Complaint    Lauren David is a 62 y.o. female with a hx of CAD, s/p CABG in 2022, hypertension, hyperlipidemia, type 2 diabetes, COPD, anxiety, and asthma, who presents today for 75-month follow-up.  Past Medical History    Past Medical History:  Diagnosis Date   Anxiety    Asthma    CAD (coronary artery disease)    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   Chronic back pain    COPD (chronic obstructive pulmonary disease)    Depression    Essential hypertension    Headache    Hyperlipidemia    Myocardial infarction    Pneumonia    Type 2 diabetes mellitus    Past Surgical History:  Procedure Laterality Date   BREAST BIOPSY Left    COLONOSCOPY N/A 04/26/2018   Procedure: COLONOSCOPY;  Surgeon: Malissa Hippo, MD;  [facility];  Service: Endoscopy;  Laterality: N/A;  1:15   CORONARY ARTERY BYPASS GRAFT N/A 05/21/2021   Procedure: CORONARY ARTERY BYPASS GRAFTING (CABG) X4 ON PUMP USING LEFT INTERNAL MAMMARY ARTERY AND ENDOSCOPICALLY HARVESTED RIGHT GREATER SAPHENOUS VEIN;  Surgeon: Corliss Skains, MD;  [facility];  Service: Open Heart Surgery;  Laterality: N/A;   CORONARY PRESSURE/FFR STUDY N/A 05/12/2018   Procedure: INTRAVASCULAR PRESSURE WIRE/FFR STUDY;  Surgeon: Yvonne Kendall, MD;  [facility];  Service: Cardiovascular;  Laterality: N/A;   CORONARY PRESSURE/FFR STUDY N/A  04/22/2021   Procedure: INTRAVASCULAR PRESSURE WIRE/FFR STUDY;  Surgeon: Yvonne Kendall, MD;  [facility];  Service: Cardiovascular;  Laterality: N/A;   ENDOVEIN HARVEST OF GREATER SAPHENOUS VEIN Right 05/21/2021   Procedure: ENDOVEIN HARVEST OF GREATER SAPHENOUS VEIN;  Surgeon: Corliss Skains, MD;  [facility];  Service: Open Heart Surgery;  Laterality: Right;   LEFT HEART CATH AND CORONARY ANGIOGRAPHY N/A 05/12/2018   Procedure: LEFT HEART CATH AND CORONARY ANGIOGRAPHY;  Surgeon: Yvonne Kendall, MD;  [facility];  Service: Cardiovascular;  Laterality: N/A;   LEFT HEART CATH AND CORONARY ANGIOGRAPHY N/A 04/22/2021   Procedure: LEFT HEART CATH AND CORONARY ANGIOGRAPHY;  Surgeon: Yvonne Kendall, MD;  [facility];  Service: Cardiovascular;  Laterality: N/A;   POLYPECTOMY  04/26/2018   Procedure: POLYPECTOMY;  Surgeon: Malissa Hippo, MD;  [facility];  Service: Endoscopy;;  colon    RADIAL ARTERY HARVEST Left 05/21/2021   Procedure: RADIAL ARTERY HARVEST;  Surgeon: Corliss Skains, MD;  [facility];  Service: Open Heart Surgery;  Laterality: Left;   TEE WITHOUT CARDIOVERSION N/A 05/21/2021   Procedure: TRANSESOPHAGEAL ECHOCARDIOGRAM (TEE);  Surgeon: Corliss Skains, MD;  [facility];  Service: Open Heart Surgery;  Laterality: N/A;   TOTAL VAGINAL HYSTERECTOMY      Allergies  Allergies  Allergen  Reactions   Percocet [Oxycodone-Acetaminophen] Rash    History of Present Illness    Lauren David is a 62 y.o. female with a PMH as mentioned above.  Last seen by Dr. Diona Browner on March 24, 2022.  Was doing well at the time.  Today she presents for 38-month follow-up.  She states   EKGs/Labs/Other Studies Reviewed:   The following studies were reviewed today: ***  EKG:  EKG is *** ordered today.  The ekg ordered today demonstrates ***  Recent Labs: 05/28/2022: Hemoglobin 13.2; Platelets  252 11/06/2022: ALT 17; BUN 10; Creatinine, Ser 0.55; Potassium 4.6; Sodium 136  Recent Lipid Panel    Component Value Date/Time   CHOL 125 05/28/2022 0858   TRIG 86 05/28/2022 0858   HDL 52 05/28/2022 0858   CHOLHDL 2.4 05/28/2022 0858   CHOLHDL 3.9 05/22/2021 0308   VLDL 14 05/22/2021 0308   LDLCALC 56 05/28/2022 0858    Risk Assessment/Calculations:  {Does this patient have ATRIAL FIBRILLATION?:[phone]}  Home Medications   No outpatient medications have been marked as taking for the 11/11/22 encounter (Appointment) with Sharlene Dory, NP.     Review of Systems   ***   All other systems reviewed and are otherwise negative except as noted above.  Physical Exam    VS:  There were no vitals taken for this visit. , BMI There is no height or weight on file to calculate BMI.  Wt Readings from Last 3 Encounters:  11/06/22 166 lb (75.3 kg)  06/02/22 191 lb 3.2 oz (86.7 kg)  03/24/22 191 lb 3.2 oz (86.7 kg)     GEN: Well nourished, well developed, in no acute distress. HEENT: normal. Neck: Supple, no JVD, carotid bruits, or masses. Cardiac: ***RRR, no murmurs, rubs, or gallops. No clubbing, cyanosis, edema.  ***Radials/PT 2+ and equal bilaterally.  Respiratory:  ***Respirations regular and unlabored, clear to auscultation bilaterally. GI: Soft, nontender, nondistended. MS: No deformity or atrophy. Skin: Warm and dry, no rash. Neuro:  Strength and sensation are intact. Psych: Normal affect.  Assessment & Plan    ***  {Are you ordering a CV Procedure (e.g. stress test, cath, DCCV, TEE, etc)?   Press F2        :[id number]}      Disposition: Follow up {follow up:[id number]} with Nona Dell, MD or APP.  Signed, Sharlene Dory, NP 11/11/2022, 12:49 PM [facility]

## 2022-12-04 ENCOUNTER — Encounter: Payer: Self-pay | Admitting: Internal Medicine

## 2022-12-04 ENCOUNTER — Ambulatory Visit (INDEPENDENT_AMBULATORY_CARE_PROVIDER_SITE_OTHER): Payer: Medicare Other | Admitting: Internal Medicine

## 2022-12-04 VITALS — BP 136/66 | HR 65 | Ht 61.0 in | Wt 172.8 lb

## 2022-12-04 DIAGNOSIS — E1149 Type 2 diabetes mellitus with other diabetic neurological complication: Secondary | ICD-10-CM

## 2022-12-04 DIAGNOSIS — E1151 Type 2 diabetes mellitus with diabetic peripheral angiopathy without gangrene: Secondary | ICD-10-CM

## 2022-12-04 DIAGNOSIS — Z7984 Long term (current) use of oral hypoglycemic drugs: Secondary | ICD-10-CM

## 2022-12-04 DIAGNOSIS — J449 Chronic obstructive pulmonary disease, unspecified: Secondary | ICD-10-CM | POA: Diagnosis not present

## 2022-12-04 DIAGNOSIS — I10 Essential (primary) hypertension: Secondary | ICD-10-CM | POA: Diagnosis not present

## 2022-12-04 LAB — HM DIABETES EYE EXAM

## 2022-12-04 NOTE — Assessment & Plan Note (Addendum)
BP Readings from Last 1 Encounters:  12/04/22 136/66   Well-controlled On metoprolol 12.5 mg BID again, BP now wnl Counseled for compliance with the medications Advised DASH diet and walking as tolerated

## 2022-12-04 NOTE — Assessment & Plan Note (Signed)
Denies any major injury including bruising Has chronic low back pain, but denies any recent change in quality or intensity

## 2022-12-04 NOTE — Assessment & Plan Note (Addendum)
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
## 2022-12-04 NOTE — Assessment & Plan Note (Signed)
Well-controlled with Incruse and PRN Albuterol, refilled Has been trying to quit smoking, has tried Chantix, switched to Wellbutrin, but did not help 

## 2022-12-04 NOTE — Assessment & Plan Note (Addendum)
Foot pain improved with gabapentin 200 mg qHS

## 2022-12-04 NOTE — Patient Instructions (Signed)
Please continue to take medications as prescribed. ? ?Please continue to follow low carb diet and perform moderate exercise/walking at least 150 mins/week. ?

## 2023-01-07 ENCOUNTER — Telehealth: Payer: Self-pay | Admitting: Cardiology

## 2023-01-07 DIAGNOSIS — E782 Mixed hyperlipidemia: Secondary | ICD-10-CM

## 2023-01-07 MED ORDER — ROSUVASTATIN CALCIUM 20 MG PO TABS: 20.0000 mg | ORAL_TABLET | Freq: Every day | ORAL | 1 refills | Status: DC

## 2023-01-07 NOTE — Telephone Encounter (Signed)
*  STAT* If patient is at the pharmacy, call can be transferred to refill team.   1. Which medications need to be refilled? (please list name of each medication and dose if known) rosuvastatin (CRESTOR) 20 MG tablet  2. Which pharmacy/location (including street and city if local pharmacy) is medication to be sent to? [facility] - [place], [place] - [address]    3. Do they need a 30 day or 90 day supply?  Patient is requesting to have current Rx increased to 90 days

## 2023-01-08 ENCOUNTER — Other Ambulatory Visit: Payer: Self-pay | Admitting: Internal Medicine

## 2023-01-08 DIAGNOSIS — E559 Vitamin D deficiency, unspecified: Secondary | ICD-10-CM

## 2023-01-08 DIAGNOSIS — E1149 Type 2 diabetes mellitus with other diabetic neurological complication: Secondary | ICD-10-CM

## 2023-01-27 LAB — CMP14+EGFR
ALT: 14 IU/L (ref 0–32)
AST: 16 IU/L (ref 0–40)
Albumin: 4 g/dL (ref 3.9–4.9)
Alkaline Phosphatase: 84 IU/L (ref 44–121)
BUN/Creatinine Ratio: 16 (ref 12–28)
BUN: 11 mg/dL (ref 8–27)
Bilirubin Total: 0.3 mg/dL (ref 0.0–1.2)
CO2: 24 mmol/L (ref 20–29)
Calcium: 9.6 mg/dL (ref 8.7–10.3)
Chloride: 105 mmol/L (ref 96–106)
Creatinine, Ser: 0.7 mg/dL (ref 0.57–1.00)
Globulin, Total: 2.5 g/dL (ref 1.5–4.5)
Glucose: 122 mg/dL — ABNORMAL HIGH (ref 70–99)
Potassium: 4.3 mmol/L (ref 3.5–5.2)
Sodium: 143 mmol/L (ref 134–144)
Total Protein: 6.5 g/dL (ref 6.0–8.5)
eGFR: 97 mL/min/{1.73_m2} (ref >59)

## 2023-01-27 LAB — LIPID PANEL
Chol/HDL Ratio: 2.7 ratio (ref 0.0–4.4)
Cholesterol, Total: 130 mg/dL (ref 100–199)
HDL: 48 mg/dL (ref >39)
LDL Chol Calc (NIH): 67 mg/dL (ref 0–99)
Triglycerides: 78 mg/dL (ref 0–149)
VLDL Cholesterol Cal: 15 mg/dL (ref 5–40)

## 2023-01-27 LAB — HEMOGLOBIN A1C
Est. average glucose Bld gHb Est-mCnc: 171 mg/dL
Hgb A1c MFr Bld: 7.6 % — ABNORMAL HIGH (ref 4.8–5.6)

## 2023-01-28 ENCOUNTER — Ambulatory Visit (INDEPENDENT_AMBULATORY_CARE_PROVIDER_SITE_OTHER): Payer: Medicare Other | Admitting: Internal Medicine

## 2023-01-28 ENCOUNTER — Encounter: Payer: Self-pay | Admitting: Internal Medicine

## 2023-01-28 VITALS — BP 136/72 | HR 62 | Ht 61.0 in | Wt 172.0 lb

## 2023-01-28 DIAGNOSIS — I10 Essential (primary) hypertension: Secondary | ICD-10-CM | POA: Diagnosis not present

## 2023-01-28 DIAGNOSIS — J449 Chronic obstructive pulmonary disease, unspecified: Secondary | ICD-10-CM

## 2023-01-28 DIAGNOSIS — Z951 Presence of aortocoronary bypass graft: Secondary | ICD-10-CM

## 2023-01-28 DIAGNOSIS — E1151 Type 2 diabetes mellitus with diabetic peripheral angiopathy without gangrene: Secondary | ICD-10-CM | POA: Diagnosis not present

## 2023-01-28 MED ORDER — TIRZEPATIDE 5 MG/0.5ML ~~LOC~~ SOAJ: 5.0000 mg | SUBCUTANEOUS | 1 refills | Status: DC

## 2023-01-28 NOTE — Assessment & Plan Note (Signed)
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

## 2023-01-28 NOTE — Assessment & Plan Note (Signed)
Well-controlled with Incruse and PRN Albuterol, refilled Has been trying to quit smoking, has tried Chantix, switched to Wellbutrin, but did not help 

## 2023-01-28 NOTE — Assessment & Plan Note (Signed)
On aspirin and statin On Metoprolol Followed by thoracic surgery and cardiology No chest pain or dyspnea currently

## 2023-01-28 NOTE — Progress Notes (Signed)
Established Patient Office Visit  Subjective:  Patient ID: Lauren David, female    DOB: 07/30/1961  Age: 62 y.o. MRN: [id number]  CC:  Chief Complaint  Patient presents with   Hypertension   Diabetes    HPI Lauren David is a 62 y.o. female with past medical history of CAD s/p CABG, HTN, COPD, type 2 DM, Osteopenia, chronic left hip pain and tobacco abuse who presents for f/u of her chronic medical conditions.  CAD s/p CABG: She denies any chest pain or dyspnea currently. She is taking Aspirin and statin currently. She is on Metoprolol as well.  HTN: BP is wnl now. Patient denies headache, dizziness, chest pain, dyspnea or palpitations.  Type 2 DM: She had been taking Glipizide, Trulicity and Jardiance. Her HbA1c was 14.1 in 04/24, but has improved to 7.6 now. Her blood glucose has been between 140-200 since starting her medications. Denies any episode of hypoglycemia. She denies any dysuria or hematuria. She c/o b/l feet pain, burning in nature. She also reports more pain upon walking.  She has noticed significant improvement in her foot pain with gabapentin.   She has cut down smoking and does not smoke everyday now. She has tried Chantix and Wellbutrin, but was not able to quit smoking.    Past Medical History:  Diagnosis Date   Anxiety    Asthma    CAD (coronary artery disease)    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   Chronic back pain    COPD (chronic obstructive pulmonary disease) (HCC)    Depression    Essential hypertension    Headache    Hyperlipidemia    Myocardial infarction (HCC)    Pneumonia    Type 2 diabetes mellitus (HCC)     Past Surgical History:  Procedure Laterality Date   BREAST BIOPSY Left    COLONOSCOPY N/A 04/26/2018   Procedure: COLONOSCOPY;  Surgeon: Malissa Hippo, MD;  Location: [facility];  Service: Endoscopy;  Laterality: N/A;  1:15   CORONARY ARTERY BYPASS GRAFT N/A 05/21/2021   Procedure: CORONARY ARTERY BYPASS  GRAFTING (CABG) X4 ON PUMP USING LEFT INTERNAL MAMMARY ARTERY AND ENDOSCOPICALLY HARVESTED RIGHT GREATER SAPHENOUS VEIN;  Surgeon: Corliss Skains, MD;  Location: [facility];  Service: Open Heart Surgery;  Laterality: N/A;   CORONARY PRESSURE/FFR STUDY N/A 05/12/2018   Procedure: INTRAVASCULAR PRESSURE WIRE/FFR STUDY;  Surgeon: Yvonne Kendall, MD;  Location: [facility];  Service: Cardiovascular;  Laterality: N/A;   CORONARY PRESSURE/FFR STUDY N/A 04/22/2021   Procedure: INTRAVASCULAR PRESSURE WIRE/FFR STUDY;  Surgeon: Yvonne Kendall, MD;  Location: [facility];  Service: Cardiovascular;  Laterality: N/A;   ENDOVEIN HARVEST OF GREATER SAPHENOUS VEIN Right 05/21/2021   Procedure: ENDOVEIN HARVEST OF GREATER SAPHENOUS VEIN;  Surgeon: Corliss Skains, MD;  Location: [facility];  Service: Open Heart Surgery;  Laterality: Right;   LEFT HEART CATH AND CORONARY ANGIOGRAPHY N/A 05/12/2018   Procedure: LEFT HEART CATH AND CORONARY ANGIOGRAPHY;  Surgeon: Yvonne Kendall, MD;  Location: [facility];  Service: Cardiovascular;  Laterality: N/A;   LEFT HEART CATH AND CORONARY ANGIOGRAPHY N/A 04/22/2021   Procedure: LEFT HEART CATH AND CORONARY ANGIOGRAPHY;  Surgeon: Yvonne Kendall, MD;  Location: [facility];  Service: Cardiovascular;  Laterality: N/A;   POLYPECTOMY  04/26/2018   Procedure: POLYPECTOMY;  Surgeon: Malissa Hippo, MD;  Location: [facility];  Service: Endoscopy;;  colon    RADIAL ARTERY HARVEST  Left 05/21/2021   Procedure: RADIAL ARTERY HARVEST;  Surgeon: Corliss Skains, MD;  Location: [facility];  Service: Open Heart Surgery;  Laterality: Left;   TEE WITHOUT CARDIOVERSION N/A 05/21/2021   Procedure: TRANSESOPHAGEAL ECHOCARDIOGRAM (TEE);  Surgeon: Corliss Skains, MD;  Location[facility];  Service: Open Heart Surgery;  Laterality: N/A;   TOTAL VAGINAL HYSTERECTOMY      Family History  Problem Relation Age of Onset   Stroke Father    Hypertension Father     Breast cancer Paternal Aunt    Breast cancer Paternal Aunt    Breast cancer Paternal Grandmother     Social History   Socioeconomic History   Marital status: Married    Spouse name: Not on file   Number of children: Not on file   Years of education: Not on file   Highest education level: Not on file  Occupational History   Not on file  Tobacco Use   Smoking status: Every Day    Packs/day: 1.00    Years: 40.00    Additional pack years: 0.00    Total pack years: 40.00    Types: Cigarettes    Passive exposure: Never   Smokeless tobacco: Never   Tobacco comments:    cut back to 1/2 pack a day. Smokig x 78yrs. Did smoke 2 packs x 5 yrs, but cut back a month ago.   Vaping Use   Vaping Use: Never used  Substance and Sexual Activity   Alcohol use: Never   Drug use: Never   Sexual activity: Not on file  Other Topics Concern   Not on file  Social History Narrative   Not on file   Social Determinants of Health   Financial Resource Strain: Low Risk  (04/08/2022)   Overall Financial Resource Strain (CARDIA)    Difficulty of Paying Living Expenses: Not hard at all  Food Insecurity: No Food Insecurity (04/08/2022)   Hunger Vital Sign    Worried About Running Out of Food in the Last Year: Never true    Ran Out of Food in the Last Year: Never true  Transportation Needs: No Transportation Needs (03/11/2021)   PRAPARE - Administrator, Civil Service (Medical): No    Lack of Transportation (Non-Medical): No  Physical Activity: Insufficiently Active (04/08/2022)   Exercise Vital Sign    Days of Exercise per Week: 3 days    Minutes of Exercise per Session: 30 min  Stress: No Stress Concern Present (04/08/2022)   [employer] of Occupational Health - Occupational Stress Questionnaire    Feeling of Stress : Not at all  Social Connections: Moderately Isolated (04/08/2022)   Social Connection and Isolation Panel [NHANES]    Frequency of Communication with Friends and Family:  More than three times a week    Frequency of Social Gatherings with Friends and Family: More than three times a week    Attends Religious Services: More than 4 times per year    Active Member of [employer] or Organizations: No    Attends Banker Meetings: Never    Marital Status: Separated  Intimate Partner Violence: Not At Risk (04/08/2022)   Humiliation, Afraid, Rape, and Kick questionnaire    Fear of Current or Ex-Partner: No    Emotionally Abused: No    Physically Abused: No    Sexually Abused: No    Outpatient Medications Prior to Visit  Medication Sig Dispense Refill   acetaminophen (TYLENOL) 500 MG tablet Take  1,000 mg by mouth every 6 (six) hours as needed for moderate pain or headache.     albuterol (PROVENTIL) (2.5 MG/3ML) 0.083% nebulizer solution ONE VIAL BY NEBULIZATION EVERY 6 HOURS AS NEEDED FOR WHEEZING OR SHORTNESS OF BREATH. (Patient taking differently: Take 2.5 mg by nebulization every 6 (six) hours as needed for wheezing.) 180 mL 0   albuterol (VENTOLIN HFA) 108 (90 Base) MCG/ACT inhaler Inhale 2 puffs into the lungs every 4 (four) hours as needed for wheezing or shortness of breath. 18 g 2   aspirin EC 81 MG tablet Take 81 mg by mouth daily. Swallow whole.     blood glucose meter kit and supplies Dispense based on patient and insurance preference. Use up to four times daily as directed. (FOR ICD-10 E10.9, E11.9). 1 each 0   celecoxib (CELEBREX) 200 MG capsule Take 1 capsule (200 mg total) by mouth daily as needed for mild pain or moderate pain. 30 capsule 0   empagliflozin (JARDIANCE) 10 MG TABS tablet Take 1 tablet (10 mg total) by mouth daily. 90 tablet 1   gabapentin (NEURONTIN) 100 MG capsule TAKE 2 CAPSULES AT BEDTIME 180 capsule 0   glipiZIDE (GLUCOTROL) 5 MG tablet Take 1 tablet (5 mg total) by mouth 2 (two) times daily before a meal. 180 tablet 1   ketoconazole (NIZORAL) 2 % cream Apply 1 Application topically daily. 15 g 0   metoprolol tartrate  (LOPRESSOR) 25 MG tablet Take 0.5 tablets (12.5 mg total) by mouth 2 (two) times daily. 90 tablet 1   nitroGLYCERIN (NITROSTAT) 0.4 MG SL tablet Place 1 tablet (0.4 mg total) under the tongue every 5 (five) minutes x 3 doses as needed for chest pain (If no relief after 3rd dose, GO TO ED). 30 tablet 1   rosuvastatin (CRESTOR) 20 MG tablet Take 1 tablet (20 mg total) by mouth daily. 90 tablet 1   umeclidinium bromide (INCRUSE ELLIPTA) 62.5 MCG/ACT AEPB Inhale 1 puff into the lungs daily. 30 each 5   Vitamin D, Ergocalciferol, 50000 units CAPS TAKE 1 CAPSULE BY MOUTH EVERY 7 DAYS. 12 capsule 0   Dulaglutide (TRULICITY) 1.5 MG/0.5ML SOPN Inject 1.5 mg into the skin once a week. 6 mL 1   No facility-administered medications prior to visit.    Allergies  Allergen Reactions   Percocet [Oxycodone-Acetaminophen] Rash    ROS Review of Systems  Constitutional:  Negative for chills and fever.  HENT:  Negative for congestion, postnasal drip, sinus pressure and sinus pain.   Eyes:  Negative for pain and discharge.  Respiratory:  Positive for cough. Negative for shortness of breath and wheezing.   Cardiovascular:  Negative for chest pain and palpitations.  Gastrointestinal:  Negative for abdominal pain, diarrhea, nausea and vomiting.  Endocrine: Negative for polydipsia and polyuria.  Genitourinary:  Negative for dysuria and hematuria.  Musculoskeletal:  Positive for arthralgias and back pain. Negative for neck pain and neck stiffness.  Skin:  Negative for rash.  Neurological:  Negative for dizziness and weakness.  Psychiatric/Behavioral:  Positive for sleep disturbance. Negative for agitation and behavioral problems.       Objective:    Physical Exam Vitals reviewed.  Constitutional:      General: She is not in acute distress.    Appearance: She is not diaphoretic.  HENT:     Head: Normocephalic and atraumatic.     Mouth/Throat:     Mouth: Mucous membranes are moist.  Eyes:     General:  No scleral  icterus.    Extraocular Movements: Extraocular movements intact.  Cardiovascular:     Rate and Rhythm: Normal rate and regular rhythm.     Heart sounds: Normal heart sounds. No murmur heard. Pulmonary:     Breath sounds: Normal breath sounds. No wheezing or rales.  Musculoskeletal:     Right shoulder: No swelling. Decreased range of motion.     Cervical back: Neck supple. No tenderness.     Right lower leg: No edema.     Left lower leg: No edema.  Skin:    General: Skin is warm.     Findings: No rash.  Neurological:     General: No focal deficit present.     Mental Status: She is alert and oriented to person, place, and time.  Psychiatric:        Mood and Affect: Mood normal.        Behavior: Behavior normal.     BP 136/72 (BP Location: Right Arm, Cuff Size: Normal)   Pulse 62   Ht 5\' 1"  (1.549 m)   Wt 172 lb (78 kg)   SpO2 95%   BMI 32.50 kg/m  Wt Readings from Last 3 Encounters:  01/28/23 172 lb (78 kg)  12/04/22 172 lb 12.8 oz (78.4 kg)  11/11/22 168 lb 3.2 oz (76.3 kg)    Lab Results  Component Value Date   TSH 1.380 01/24/2021   Lab Results  Component Value Date   WBC 10.9 (H) 05/28/2022   HGB 13.2 05/28/2022   HCT 42.3 05/28/2022   MCV 89 05/28/2022   PLT 252 05/28/2022   Lab Results  Component Value Date   NA 143 01/26/2023   K 4.3 01/26/2023   CO2 24 01/26/2023   GLUCOSE 122 (H) 01/26/2023   BUN 11 01/26/2023   CREATININE 0.70 01/26/2023   BILITOT 0.3 01/26/2023   ALKPHOS 84 01/26/2023   AST 16 01/26/2023   ALT 14 01/26/2023   PROT 6.5 01/26/2023   ALBUMIN 4.0 01/26/2023   CALCIUM 9.6 01/26/2023   ANIONGAP 9 05/24/2021   EGFR 97 01/26/2023   Lab Results  Component Value Date   CHOL 130 01/26/2023   Lab Results  Component Value Date   HDL 48 01/26/2023   Lab Results  Component Value Date   LDLCALC 67 01/26/2023   Lab Results  Component Value Date   TRIG 78 01/26/2023   Lab Results  Component Value Date   CHOLHDL  2.7 01/26/2023   Lab Results  Component Value Date   HGBA1C 7.6 (H) 01/26/2023      Assessment & Plan:   Problem List Items Addressed This Visit    Primary hypertension BP Readings from Last 1 Encounters:  01/28/23 136/72   Well-controlled On metoprolol 12.5 mg BID again, BP now wnl Counseled for compliance with the medications Advised DASH diet and walking as tolerated  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) Lab Results  Component Value Date   HGBA1C 7.6 (H) 01/26/2023   Was uncontrolled due to noncompliance, but improving now Was well-controlled with Trulicity 1.5 mg qw, Glipizide 5 mg BID and Jardiance 10 mg daily - restarted old regimen recently Has had difficulty obtaining Trulicity recently, switched to [facility] Does not tolerate Metformin, 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 - on Gabapentin Advised to follow diabetic diet F/u CMP and lipid panel Diabetic eye exam: Advised to follow up with Ophthalmology for diabetic eye exam  Has some symptoms of PAD, check [place] ABI  screening  S/P CABG x 4 On aspirin and statin On Metoprolol Followed by thoracic surgery and cardiology No chest pain or dyspnea currently  Chronic obstructive pulmonary disease (HCC) Well-controlled with Incruse and PRN Albuterol, refilled Has been trying to quit smoking, has tried Chantix, switched to Wellbutrin, but did not help    Meds ordered this encounter  Medications   tirzepatide (MOUNJARO) 5 MG/0.5ML Pen    Sig: Inject 5 mg into the skin once a week.    Dispense:  6 mL    Refill:  1    Follow-up: Return in about 3 months (around 04/30/2023) for HTN and DM.    Anabel Halon, MD

## 2023-01-28 NOTE — Assessment & Plan Note (Signed)
BP Readings from Last 1 Encounters:  01/28/23 136/72   Well-controlled On metoprolol 12.5 mg BID again, BP now wnl Counseled for compliance with the medications Advised DASH diet and walking as tolerated

## 2023-01-28 NOTE — Patient Instructions (Signed)
Please start taking Mounjaro instead of Trulicity once you run out of Trulicity.  Please continue to take medications as prescribed.  Please continue to follow low carb diet and perform moderate exercise/walking at least 150 mins/week.

## 2023-01-29 ENCOUNTER — Ambulatory Visit: Payer: Medicare Other | Admitting: Internal Medicine

## 2023-01-30 LAB — MICROALBUMIN / CREATININE URINE RATIO
Creatinine, Urine: 48.6 mg/dL
Microalb/Creat Ratio: 71 mg/g creat — ABNORMAL HIGH (ref 0–29)
Microalbumin, Urine: 34.5 ug/mL

## 2023-02-04 ENCOUNTER — Ambulatory Visit (HOSPITAL_COMMUNITY)
Admission: RE | Admit: 2023-02-04 | Discharge: 2023-02-04 | Disposition: A | Discharge status: Home / Self Care | Payer: Medicare Other | Source: Ambulatory Visit | Attending: Internal Medicine | Admitting: Internal Medicine

## 2023-02-04 DIAGNOSIS — E1151 Type 2 diabetes mellitus with diabetic peripheral angiopathy without gangrene: Secondary | ICD-10-CM | POA: Insufficient documentation

## 2023-02-20 ENCOUNTER — Encounter: Payer: Self-pay | Admitting: *Deleted

## 2023-04-09 ENCOUNTER — Ambulatory Visit (INDEPENDENT_AMBULATORY_CARE_PROVIDER_SITE_OTHER): Payer: Medicare Other

## 2023-04-09 VITALS — Ht 61.0 in | Wt 170.0 lb

## 2023-04-09 DIAGNOSIS — Z0000 Encounter for general adult medical examination without abnormal findings: Secondary | ICD-10-CM

## 2023-04-09 DIAGNOSIS — Z1231 Encounter for screening mammogram for malignant neoplasm of breast: Secondary | ICD-10-CM

## 2023-04-09 NOTE — Progress Notes (Signed)
 Because this visit was a virtual/telehealth visit,  certain criteria was not obtained, such a blood pressure, CBG if patient is a diabetic, and timed get up and go. Any medications not marked as "taking" was not mentioned during the medication reconciliation part of the visit. Any vitals not documented were not able to be obtained due to this being a telehealth visit. Vitals that have been documented are verbally provided by the patient.  Patient was unable to self-report a recent blood pressure reading due to a lack of equipment at home via telehealth.  Subjective:   Lauren David is a 62 y.o. female who presents for Medicare Annual (Subsequent) preventive examination.  Visit Complete: Virtual  I connected with  CHARLOTTE MARCHEWKA on 04/09/23 by a audio enabled telemedicine application and verified that I am speaking with the correct person using two identifiers.  Patient Location: Home  Provider Location: Home Office  I discussed the limitations of evaluation and management by telemedicine. The patient expressed understanding and agreed to proceed.  Patient Medicare AWV questionnaire was completed by the patient on na; I have confirmed that all information answered by patient is correct and no changes since this date.  Review of Systems     Cardiac Risk Factors include: advanced age (>86men, >81 women);diabetes mellitus;dyslipidemia;hypertension;smoking/ tobacco exposure;obesity (BMI >30kg/m2)     Objective:    Today's Vitals   04/09/23 1054 04/09/23 1056  Weight: 170 lb (77.1 kg)   Height: 5\' 1"  (1.549 m)   PainSc:  2    Body mass index is 32.12 kg/m.     04/09/2023   10:54 AM 04/08/2022   11:03 AM 08/09/2021    1:23 PM 05/21/2021    6:04 AM 05/17/2021    2:24 PM 04/22/2021    9:18 AM 03/11/2021   10:59 AM  Advanced Directives  Does Patient Have a Medical Advance Directive? No No No No No No No  Would patient like information on creating a medical advance directive? No - Patient  declined No - Patient declined Yes (MAU/Ambulatory/Procedural Areas - Information given) No - Patient declined No - Patient declined No - Patient declined Yes (MAU/Ambulatory/Procedural Areas - Information given)    Current Medications (verified) Outpatient Encounter Medications as of 04/09/2023  Medication Sig   acetaminophen (TYLENOL) 500 MG tablet Take 1,000 mg by mouth every 6 (six) hours as needed for moderate pain or headache.   albuterol (PROVENTIL) (2.5 MG/3ML) 0.083% nebulizer solution ONE VIAL BY NEBULIZATION EVERY 6 HOURS AS NEEDED FOR WHEEZING OR SHORTNESS OF BREATH. (Patient taking differently: Take 2.5 mg by nebulization every 6 (six) hours as needed for wheezing.)   albuterol (VENTOLIN HFA) 108 (90 Base) MCG/ACT inhaler Inhale 2 puffs into the lungs every 4 (four) hours as needed for wheezing or shortness of breath.   aspirin EC 81 MG tablet Take 81 mg by mouth daily. Swallow whole.   blood glucose meter kit and supplies Dispense based on patient and insurance preference. Use up to four times daily as directed. (FOR ICD-10 E10.9, E11.9).   celecoxib (CELEBREX) 200 MG capsule Take 1 capsule (200 mg total) by mouth daily as needed for mild pain or moderate pain.   empagliflozin (JARDIANCE) 10 MG TABS tablet Take 1 tablet (10 mg total) by mouth daily.   gabapentin (NEURONTIN) 100 MG capsule TAKE 2 CAPSULES AT BEDTIME   glipiZIDE (GLUCOTROL) 5 MG tablet Take 1 tablet (5 mg total) by mouth 2 (two) times daily before a meal.  ketoconazole (NIZORAL) 2 % cream Apply 1 Application topically daily.   metoprolol tartrate (LOPRESSOR) 25 MG tablet Take 0.5 tablets (12.5 mg total) by mouth 2 (two) times daily.   nitroGLYCERIN (NITROSTAT) 0.4 MG SL tablet Place 1 tablet (0.4 mg total) under the tongue every 5 (five) minutes x 3 doses as needed for chest pain (If no relief after 3rd dose, GO TO ED).   rosuvastatin (CRESTOR) 20 MG tablet Take 1 tablet (20 mg total) by mouth daily.   tirzepatide  [facility]) 5 MG/0.5ML Pen Inject 5 mg into the skin once a week.   umeclidinium bromide (INCRUSE ELLIPTA) 62.5 MCG/ACT AEPB Inhale 1 puff into the lungs daily.   Vitamin D, Ergocalciferol, 50000 units CAPS TAKE 1 CAPSULE BY MOUTH EVERY 7 DAYS.   No facility-administered encounter medications on file as of 04/09/2023.    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   Chronic back pain    COPD (chronic obstructive pulmonary disease) (HCC)    Depression    Essential hypertension    Headache    Hyperlipidemia    Myocardial infarction (HCC)    Pneumonia    Type 2 diabetes mellitus (HCC)    Past Surgical History:  Procedure Laterality Date   BREAST BIOPSY Left    COLONOSCOPY N/A 04/26/2018   Procedure: COLONOSCOPY;  Surgeon: Malissa Hippo, MD;  Location: [facility];  Service: Endoscopy;  Laterality: N/A;  1:15   CORONARY ARTERY BYPASS GRAFT N/A 05/21/2021   Procedure: CORONARY ARTERY BYPASS GRAFTING (CABG) X4 ON PUMP USING LEFT INTERNAL MAMMARY ARTERY AND ENDOSCOPICALLY HARVESTED RIGHT GREATER SAPHENOUS VEIN;  Surgeon: Corliss Skains, MD;  Location: [facility] OR;  Service: Open Heart Surgery;  Laterality: N/A;   CORONARY PRESSURE/FFR STUDY N/A 05/12/2018   Procedure: INTRAVASCULAR PRESSURE WIRE/FFR STUDY;  Surgeon: Yvonne Kendall, MD;  Location: [facility];  Service: Cardiovascular;  Laterality: N/A;   CORONARY PRESSURE/FFR STUDY N/A 04/22/2021   Procedure: INTRAVASCULAR PRESSURE WIRE/FFR STUDY;  Surgeon: Yvonne Kendall, MD;  Location: [facility];  Service: Cardiovascular;  Laterality: N/A;   ENDOVEIN HARVEST OF GREATER SAPHENOUS VEIN Right 05/21/2021   Procedure: ENDOVEIN HARVEST OF GREATER SAPHENOUS VEIN;  Surgeon: Corliss Skains, MD;  Location: [facility] OR;  Service: Open Heart Surgery;   Laterality: Right;   LEFT HEART CATH AND CORONARY ANGIOGRAPHY N/A 05/12/2018   Procedure: LEFT HEART CATH AND CORONARY ANGIOGRAPHY;  Surgeon: Yvonne Kendall, MD;  [facility];  Service: Cardiovascular;  Laterality: N/A;   LEFT HEART CATH AND CORONARY ANGIOGRAPHY N/A 04/22/2021   Procedure: LEFT HEART CATH AND CORONARY ANGIOGRAPHY;  Surgeon: Yvonne Kendall, MD;  [facility];  Service: Cardiovascular;  Laterality: N/A;   POLYPECTOMY  04/26/2018   Procedure: POLYPECTOMY;  Surgeon: Malissa Hippo, MD;  [facility];  Service: Endoscopy;;  colon    RADIAL ARTERY HARVEST Left 05/21/2021   Procedure: RADIAL ARTERY HARVEST;  Surgeon: Corliss Skains, MD;  Location: [facility] OR;  Service: Open Heart Surgery;  Laterality: Left;   TEE WITHOUT CARDIOVERSION N/A 05/21/2021   Procedure: TRANSESOPHAGEAL ECHOCARDIOGRAM (TEE);  Surgeon: Corliss Skains, MD;  Location: [facility];  Service: Open Heart Surgery;  Laterality: N/A;   TOTAL VAGINAL HYSTERECTOMY     Family History  Problem Relation  Age of Onset   Stroke Father    Hypertension Father    Breast cancer Paternal Aunt    Breast cancer Paternal Aunt    Breast cancer Paternal Grandmother    Social History   Socioeconomic History   Marital status: Married    Spouse name: Not on file   Number of children: Not on file   Years of education: Not on file   Highest education level: Not on file  Occupational History   Not on file  Tobacco Use   Smoking status: Every Day    Current packs/day: 1.00    Average packs/day: 1 pack/day for 40.0 years (40.0 ttl pk-yrs)    Types: Cigarettes    Passive exposure: Never   Smokeless tobacco: Never   Tobacco comments:    cut back to 1/2 pack a day. Smokig x 79yrs. Did smoke 2 packs x 5 yrs, but cut back a month ago.   Vaping Use   Vaping status: Never Used  Substance and Sexual Activity   Alcohol use: Never   Drug use: Never   Sexual activity: Not on file   Other Topics Concern   Not on file  Social History Narrative   Not on file   Social Determinants of Health   Financial Resource Strain: Low Risk  (04/09/2023)   Overall Financial Resource Strain (CARDIA)    Difficulty of Paying Living Expenses: Not hard at all  Food Insecurity: No Food Insecurity (04/09/2023)   Hunger Vital Sign    Worried About Running Out of Food in the Last Year: Never true    Ran Out of Food in the Last Year: Never true  Transportation Needs: No Transportation Needs (04/09/2023)   PRAPARE - Administrator, Civil Service (Medical): No    Lack of Transportation (Non-Medical): No  Physical Activity: Sufficiently Active (04/09/2023)   Exercise Vital Sign    Days of Exercise per Week: 7 days    Minutes of Exercise per Session: 30 min  Stress: Stress Concern Present (04/09/2023)   [employer] of Occupational Health - Occupational Stress Questionnaire    Feeling of Stress : To some extent  Social Connections: Moderately Isolated (04/09/2023)   Social Connection and Isolation Panel [NHANES]    Frequency of Communication with Friends and Family: More than three times a week    Frequency of Social Gatherings with Friends and Family: More than three times a week    Attends Religious Services: More than 4 times per year    Active Member of [employer] or Organizations: No    Attends Banker Meetings: Never    Marital Status: Separated    Tobacco Counseling Ready to quit: Yes Counseling given: Yes Tobacco comments: cut back to 1/2 pack a day. Smokig x 84yrs. Did smoke 2 packs x 5 yrs, but cut back a month ago.    Clinical Intake:  Pre-visit preparation completed: Yes  Pain : 0-10 Pain Score: 2  Pain Type: Chronic pain Pain Location: Back Pain Orientation: Lower Pain Descriptors / Indicators: Aching Pain Onset: More than a month ago Pain Frequency: Constant     BMI - recorded: 32.12 Nutritional Status: BMI > 30  Obese Nutritional Risks:  None Diabetes: Yes CBG done?: No (telehealth visit. unable to obtain cbg) Did pt. bring in CBG monitor from home?: No  How often do you need to have someone help you when you read instructions, pamphlets, or other written materials from your doctor or pharmacy?:  1 - Never  Interpreter Needed?: No  Information entered by ::  Kien Mirsky, CMA   Activities of Daily Living    04/09/2023   11:05 AM  In your present state of health, do you have any difficulty performing the following activities:  Hearing? 0  Vision? 0  Difficulty concentrating or making decisions? 0  Walking or climbing stairs? 0  Dressing or bathing? 0  Doing errands, shopping? 0  Preparing Food and eating ? N  Using the Toilet? N  In the past six months, have you accidently leaked urine? N  Do you have problems with loss of bowel control? N  Managing your Medications? N  Managing your Finances? N  Housekeeping or managing your Housekeeping? N    Patient Care Team: Anabel Halon, MD as PCP - General (Internal Medicine) Jonelle Sidle, MD as PCP - Cardiology (Cardiology)  Indicate any recent Medical Services you may have received from other than [facility] providers in the past year (date may be approximate).     Assessment:   This is a routine wellness examination for Terrylynn.  Hearing/Vision screen Hearing Screening - Comments:: Patient denies any hearing difficulties.   Vision Screening - Comments:: Patient states they wear reading glasses only.  Patient is up to date on yearly eye exams with Dr. Addison Naegeli in [place]   Goals Addressed             This Visit's Progress    Patient Stated       Manage my blood sugar better and comply with my medications and treatment plan       Depression Screen    04/09/2023   11:01 AM 01/28/2023   11:10 AM 12/04/2022    1:49 PM 11/06/2022   10:19 AM 06/02/2022   11:09 AM 04/08/2022   11:03 AM 04/08/2022   11:02 AM  PHQ 2/9 Scores  PHQ - 2 Score 0 0 0 0 0 0 0     Fall Risk    04/09/2023   11:05 AM 01/28/2023   11:10 AM 12/04/2022    1:48 PM 11/06/2022   10:19 AM 06/02/2022   11:09 AM  Fall Risk   Falls in the past year? 1 0 0 0 0  Number falls in past yr: 0 0 0 0 0  Injury with Fall? 0 0 0 0 0  Risk for fall due to : History of fall(s);Impaired balance/gait    No Fall Risks  Follow up Education provided;Falls prevention discussed    Falls evaluation completed    MEDICARE RISK AT HOME: Medicare Risk at Home Any stairs in or around the home?: Yes If so, are there any without handrails?: No Home free of loose throw rugs in walkways, pet beds, electrical cords, etc?: Yes Adequate lighting in your home to reduce risk of falls?: Yes Life alert?: No Use of a cane, walker or w/c?: No Grab bars in the bathroom?: Yes Shower chair or bench in shower?: Yes Elevated toilet seat or a handicapped toilet?: No  TIMED UP AND GO:  Was the test performed?  No    Cognitive Function:    03/11/2021   11:01 AM  MMSE - Mini Mental State Exam  Not completed: Unable to complete        04/09/2023   10:59 AM 04/08/2022   11:06 AM 04/08/2022   11:04 AM 03/11/2021   11:01 AM  6CIT Screen  What Year? 0 points 0 points 0 points 0 points  What month? 0 points 0 points 0 points 0 points  What time? 0 points 0 points  0 points  Count back from 20 0 points 0 points  0 points  Months in reverse 0 points 0 points  0 points  Repeat phrase 0 points 0 points  0 points  Total Score 0 points 0 points  0 points    Immunizations Immunization History  Administered Date(s) Administered   Influenza,inj,Quad PF,6+ Mos 09/10/2020   Moderna SARS-COV2 Booster Vaccination 05/09/2021   Moderna Sars-Covid-2 Vaccination 08/10/2019, 10/20/2019, 11/18/2019   PFIZER(Purple Top)SARS-COV-2 Vaccination 10/20/2019    TDAP status: Due, Education has been provided regarding the importance of this vaccine. Advised may receive this vaccine at local pharmacy or Health Dept. Aware to provide a  copy of the vaccination record if obtained from local pharmacy or Health Dept. Verbalized acceptance and understanding.  Flu Vaccine status: Due, Education has been provided regarding the importance of this vaccine. Advised may receive this vaccine at local pharmacy or Health Dept. Aware to provide a copy of the vaccination record if obtained from local pharmacy or Health Dept. Verbalized acceptance and understanding.  Pneumococcal vaccine status: NOT AGE APPROPRIATE FOR THIS PATIENT  Covid-19 vaccine status: Information provided on how to obtain vaccines.   Qualifies for Shingles Vaccine? Yes   Zostavax completed No   Shingrix Completed?: No.    Education has been provided regarding the importance of this vaccine. Patient has been advised to call insurance company to determine out of pocket expense if they have not yet received this vaccine. Advised may also receive vaccine at local pharmacy or Health Dept. Verbalized acceptance and understanding.  Screening Tests Health Maintenance  Topic Date Due   DTaP/Tdap/Td (1 - Tdap) Never done   Zoster Vaccines- Shingrix (1 of 2) Never done   Lung Cancer Screening  Never done   INFLUENZA VACCINE  03/05/2023   COVID-19 Vaccine (5 - 2023-24 season) 04/05/2023   Medicare Annual Wellness (AWV)  04/09/2023   FOOT EXAM  06/03/2023   HEMOGLOBIN A1C  07/28/2023   OPHTHALMOLOGY EXAM  12/04/2023   Diabetic kidney evaluation - eGFR measurement  01/26/2024   Diabetic kidney evaluation - Urine ACR  01/28/2024   MAMMOGRAM  02/14/2024   Colonoscopy  04/26/2025   Hepatitis C Screening  Completed   HIV Screening  Completed   HPV VACCINES  Aged Out    Health Maintenance  Health Maintenance Due  Topic Date Due   DTaP/Tdap/Td (1 - Tdap) Never done   Zoster Vaccines- Shingrix (1 of 2) Never done   Lung Cancer Screening  Never done   INFLUENZA VACCINE  03/05/2023   COVID-19 Vaccine (5 - 2023-24 season) 04/05/2023   Medicare Annual Wellness (AWV)   04/09/2023    Colorectal cancer screening: Type of screening: Colonoscopy. Completed 04/26/2018. Repeat every 7 years  Mammogram status: Ordered 04/09/2023. Pt provided with contact info and advised to call to schedule appt.   Bone Density Screening: NOT AGE APPROPRIATE FOR THIS PATIENT  Lung Cancer Screening: (Low Dose CT Chest recommended if Age 103-80 years, 20 pack-year currently smoking OR have quit w/in 15years.) does qualify.   Lung Cancer Screening Referral: ordered on 11/06/2022  Additional Screening:  Hepatitis C Screening: does not qualify; Completed 09/17/2021  Vision Screening: Recommended annual ophthalmology exams for early detection of glaucoma and other disorders of the eye. Is the patient up to date with their annual eye exam?  Yes  Who is the provider or what  is the name of the office in which the patient attends annual eye exams? [facility],  [place] If pt is not established with a provider, would they like to be referred to a provider to establish care? No .   Dental Screening: Recommended annual dental exams for proper oral hygiene  Diabetic Foot Exam: Diabetic Foot Exam: Completed 06/02/2022  Community Resource Referral / Chronic Care Management: CRR required this visit?  No   CCM required this visit?  No     Plan:     I have personally reviewed and noted the following in the patient's chart:   Medical and social history Use of alcohol, tobacco or illicit drugs  Current medications and supplements including opioid prescriptions. Patient is not currently taking opioid prescriptions. Functional ability and status Nutritional status Physical activity Advanced directives List of other physicians Hospitalizations, surgeries, and ER visits in previous 12 months Vitals Screenings to include cognitive, depression, and falls Referrals and appointments  In addition, I have reviewed and discussed with patient certain preventive protocols, quality metrics, and  best practice recommendations. A written personalized care plan for preventive services as well as general preventive health recommendations were provided to patient.     Jordan Hawks Leydy Worthey, CMA   04/09/2023   After Visit Summary: (MyChart) Due to this being a telephonic visit, the after visit summary with patients personalized plan was offered to patient via MyChart   Nurse Notes: Mammo ordered today

## 2023-04-09 NOTE — Patient Instructions (Signed)
Lauren David , Thank you for taking time to come for your Medicare Wellness Visit. I appreciate your ongoing commitment to your health goals. Please review the following plan we discussed and let me know if I can assist you in the future.   Referrals/Orders/Follow-Ups/Clinician Recommendations:  You have an order for:  []   2D Mammogram  [x]   3D Mammogram  []   Bone Density   [x]   Lung Cancer Screening  Please call for appointment:   [facility] [address] [place], [place] [address] [phone]  Make sure to wear two-piece clothing.  No lotions powders or deodorants the day of the appointment Make sure to bring picture ID and insurance card.  Bring list of medications you are currently taking including any supplements.   Schedule your [place] screening mammogram through MyChart!   Log into your MyChart account.  Go to 'Visit' (or 'Appointments' if on mobile App) --> Schedule an Appointment  Under 'Select a Reason for Visit' choose the Mammogram Screening option.  Complete the pre-visit questions and select the time and place that best fits your schedule.    This is a list of the screening recommended for you and due dates:  Health Maintenance  Topic Date Due   DTaP/Tdap/Td vaccine (1 - Tdap) Never done   Zoster (Shingles) Vaccine (1 of 2) Never done   Screening for Lung Cancer  Never done   Mammogram  02/14/2023   Flu Shot  03/05/2023   COVID-19 Vaccine (5 - 2023-24 season) 04/05/2023   Complete foot exam   06/03/2023   Hemoglobin A1C  07/28/2023   Eye exam for diabetics  12/04/2023   Yearly kidney function blood test for diabetes  01/26/2024   Yearly kidney health urinalysis for diabetes  01/28/2024   Medicare Annual Wellness Visit  04/08/2024   Colon Cancer Screening  04/26/2025   Hepatitis C Screening  Completed   HIV Screening  Completed   HPV Vaccine  Aged Out    Advanced directives: (Declined) Advance directive  discussed with you today. Even though you declined this today, please call our office should you change your mind, and we can give you the proper paperwork for you to fill out.  Next Medicare Annual Wellness Visit scheduled for next year: Yes  Preventive Care 14-62 Years Old, Female Preventive care refers to lifestyle choices and visits with your health care provider that can promote health and wellness. Preventive care visits are also called wellness exams. What can I expect for my preventive care visit? Counseling Your health care provider may ask you questions about your: Medical history, including: Past medical problems. Family medical history. Pregnancy history. Current health, including: Menstrual cycle. Method of birth control. Emotional well-being. Home life and relationship well-being. Sexual activity and sexual health. Lifestyle, including: Alcohol, nicotine or tobacco, and drug use. Access to firearms. Diet, exercise, and sleep habits. Work and work Astronomer. Sunscreen use. Safety issues such as seatbelt and bike helmet use. Physical exam Your health care provider will check your: Height and weight. These may be used to calculate your BMI (body mass index). BMI is a measurement that tells if you are at a healthy weight. Waist circumference. This measures the distance around your waistline. This measurement also tells if you are at a healthy weight and may help predict your risk of certain diseases, such as type 2 diabetes and high blood pressure. Heart rate and blood pressure. Body temperature. Skin for abnormal spots.  What immunizations do I need?  Vaccines are usually given at various ages, according to a schedule. Your health care provider will recommend vaccines for you based on your age, medical history, and lifestyle or other factors, such as travel or where you work. What tests do I need? Screening Your health care provider may recommend screening tests for  certain conditions. This may include: Lipid and cholesterol levels. Diabetes screening. This is done by checking your blood sugar (glucose) after you have not eaten for a while (fasting). Pelvic exam and Pap test. Hepatitis B test. Hepatitis C test. HIV (human immunodeficiency virus) test. STI (sexually transmitted infection) testing, if you are at risk. Lung cancer screening. Colorectal cancer screening. Mammogram. Talk with your health care provider about when you should start having regular mammograms. This may depend on whether you have a family history of breast cancer. BRCA-related cancer screening. This may be done if you have a family history of breast, ovarian, tubal, or peritoneal cancers. Bone density scan. This is done to screen for osteoporosis. Talk with your health care provider about your test results, treatment options, and if necessary, the need for more tests. Follow these instructions at home: Eating and drinking  Eat a diet that includes fresh fruits and vegetables, whole grains, lean protein, and low-fat dairy products. Take vitamin and mineral supplements as recommended by your health care provider. Do not drink alcohol if: Your health care provider tells you not to drink. You are pregnant, may be pregnant, or are planning to become pregnant. If you drink alcohol: Limit how much you have to 0-1 drink a day. Know how much alcohol is in your drink. In the [place] one drink equals one 12 oz bottle of beer (355 mL), one 5 oz glass of wine (148 mL), or one 1 oz glass of hard liquor (44 mL). Lifestyle Brush your teeth every morning and night with fluoride toothpaste. Floss one time each day. Exercise for at least 30 minutes 5 or more days each week. Do not use any products that contain nicotine or tobacco. These products include cigarettes, chewing tobacco, and vaping devices, such as e-cigarettes. If you need help quitting, ask your health care provider. Do not use  drugs. If you are sexually active, practice safe sex. Use a condom or other form of protection to prevent STIs. If you do not wish to become pregnant, use a form of birth control. If you plan to become pregnant, see your health care provider for a prepregnancy visit. Take aspirin only as told by your health care provider. Make sure that you understand how much to take and what form to take. Work with your health care provider to find out whether it is safe and beneficial for you to take aspirin daily. Find healthy ways to manage stress, such as: Meditation, yoga, or listening to music. Journaling. Talking to a trusted person. Spending time with friends and family. Minimize exposure to UV radiation to reduce your risk of skin cancer. Safety Always wear your seat belt while driving or riding in a vehicle. Do not drive: If you have been drinking alcohol. Do not ride with someone who has been drinking. When you are tired or distracted. While texting. If you have been using any mind-altering substances or drugs. Wear a helmet and other protective equipment during sports activities. If you have firearms in your house, make sure you follow all gun safety procedures. Seek help if you have been physically or sexually abused. What's next? Visit  your health care provider once a year for an annual wellness visit. Ask your health care provider how often you should have your eyes and teeth checked. Stay up to date on all vaccines. This information is not intended to replace advice given to you by your health care provider. Make sure you discuss any questions you have with your health care provider. Document Revised: 01/16/2021 Document Reviewed: 01/16/2021 Elsevier Patient Education  2024 [employer]. Understanding Your Risk for Falls Millions of people have serious injuries from falls each year. It is important to understand your risk of falling. Talk with your health care provider about your risk  and what you can do to lower it. If you do have a serious fall, make sure to tell your provider. Falling once raises your risk of falling again. How can falls affect me? Serious injuries from falls are common. These include: Broken bones, such as hip fractures. Head injuries, such as traumatic brain injuries (TBI) or concussions. A fear of falling can cause you to avoid activities and stay at home. This can make your muscles weaker and raise your risk for a fall. What can increase my risk? There are a number of risk factors that increase your risk for falling. The more risk factors you have, the higher your risk of falling. Serious injuries from a fall happen most often to people who are older than 64 years old. Teenagers and young adults ages 62-29 are also at higher risk. Common risk factors include: Weakness in the lower body. Being generally weak or confused due to long-term (chronic) illness. Dizziness or balance problems. Poor vision. Medicines that cause dizziness or drowsiness. These may include: Medicines for your blood pressure, heart, anxiety, insomnia, or swelling (edema). Pain medicines. Muscle relaxants. Other risk factors include: Drinking alcohol. Having had a fall in the past. Having foot pain or wearing improper footwear. Working at a dangerous job. Having any of the following in your home: Tripping hazards, such as floor clutter or loose rugs. Poor lighting. Pets. Having dementia or memory loss. What actions can I take to lower my risk of falling?     Physical activity Stay physically fit. Do strength and balance exercises. Consider taking a regular class to build strength and balance. Yoga and tai chi are good options. Vision Have your eyes checked every year and your prescription for glasses or contacts updated as needed. Shoes and walking aids Wear non-skid shoes. Wear shoes that have rubber soles and low heels. Do not wear high heels. Do not walk around  the house in socks or slippers. Use a cane or walker as told by your provider. Home safety Attach secure railings on both sides of your stairs. Install grab bars for your bathtub, shower, and toilet. Use a non-skid mat in your bathtub or shower. Attach bath mats securely with double-sided, non-slip rug tape. Use good lighting in all rooms. Keep a flashlight near your bed. Make sure there is a clear path from your bed to the bathroom. Use night-lights. Do not use throw rugs. Make sure all carpeting is taped or tacked down securely. Remove all clutter from walkways and stairways, including extension cords. Repair uneven or broken steps and floors. Avoid walking on icy or slippery surfaces. Walk on the grass instead of on icy or slick sidewalks. Use ice melter to get rid of ice on walkways in the winter. Use a cordless phone. Questions to ask your health care provider Can you help me check my risk for  a fall? Do any of my medicines make me more likely to fall? Should I take a vitamin D supplement? What exercises can I do to improve my strength and balance? Should I make an appointment to have my vision checked? Do I need a bone density test to check for weak bones (osteoporosis)? Would it help to use a cane or a walker? Where to find more information Centers for Disease Control and Prevention, STEADI: TonerPromos.no Community-Based Fall Prevention Programs: TonerPromos.no [employer] on Aging: BaseRingTones.pl Contact a health care provider if: You fall at home. You are afraid of falling at home. You feel weak, drowsy, or dizzy. This information is not intended to replace advice given to you by your health care provider. Make sure you discuss any questions you have with your health care provider. Document Revised: 03/24/2022 Document Reviewed: 03/24/2022 Elsevier Patient Education  2024 Elsevier Inc. Steps to Quit Smoking Smoking tobacco is the leading cause of preventable death. It can affect  almost every organ in the body. Smoking puts you and people around you at risk for many serious, long-lasting (chronic) diseases. Quitting smoking can be hard, but it is one of the best things that you can do for your health. It is never too late to quit. Do not give up if you cannot quit the first time. Some people need to try many times to quit. Do your best to stick to your quit plan, and talk with your doctor if you have any questions or concerns. How do I get ready to quit? Pick a date to quit. Set a date within the next 2 weeks to give you time to prepare. Write down the reasons why you are quitting. Keep this list in places where you will see it often. Tell your family, friends, and co-workers that you are quitting. Their support is important. Talk with your doctor about the choices that may help you quit. Find out if your health insurance will pay for these treatments. Know the people, places, things, and activities that make you want to smoke (triggers). Avoid them. What first steps can I take to quit smoking? Throw away all cigarettes at home, at work, and in your car. Throw away the things that you use when you smoke, such as ashtrays and lighters. Clean your car. Empty the ashtray. Clean your home, including curtains and carpets. What can I do to help me quit smoking? Talk with your doctor about taking medicines and seeing a counselor. You are more likely to succeed when you do both. If you are pregnant or breastfeeding: Talk with your doctor about counseling or other ways to quit smoking. Do not take medicine to help you quit smoking unless your doctor tells you to. Quit right away Quit smoking completely, instead of slowly cutting back on how much you smoke over a period of time. Stopping smoking right away may be more successful than slowly quitting. Go to counseling. In-person is best if this is an option. You are more likely to quit if you go to counseling sessions  regularly. Take medicine You may take medicines to help you quit. Some medicines need a prescription, and some you can buy over-the-counter. Some medicines may contain a drug called nicotine to replace the nicotine in cigarettes. Medicines may: Help you stop having the desire to smoke (cravings). Help to stop the problems that come when you stop smoking (withdrawal symptoms). Your doctor may ask you to use: Nicotine patches, gum, or lozenges. Nicotine inhalers or sprays.  Non-nicotine medicine that you take by mouth. Find resources Find resources and other ways to help you quit smoking and remain smoke-free after you quit. They include: Online chats with a Veterinary surgeon. Phone quitlines. Printed Materials engineer. Support groups or group counseling. Text messaging programs. Mobile phone apps. Use apps on your mobile phone or tablet that can help you stick to your quit plan. Examples of free services include Quit Guide from the CDC and smokefree.gov  What can I do to make it easier to quit?  Talk to your family and friends. Ask them to support and encourage you. Call a phone quitline, such as 1-800-QUIT-NOW, reach out to support groups, or work with a Veterinary surgeon. Ask people who smoke to not smoke around you. Avoid places that make you want to smoke, such as: Bars. Parties. Smoke-break areas at work. Spend time with people who do not smoke. Lower the stress in your life. Stress can make you want to smoke. Try these things to lower stress: Getting regular exercise. Doing deep-breathing exercises. Doing yoga. Meditating. What benefits will I see if I quit smoking? Over time, you may have: A better sense of smell and taste. Less coughing and sore throat. A slower heart rate. Lower blood pressure. Clearer skin. Better breathing. Fewer sick days. Summary Quitting smoking can be hard, but it is one of the best things that you can do for your health. Do not give up if you cannot quit  the first time. Some people need to try many times to quit. When you decide to quit smoking, make a plan to help you succeed. Quit smoking right away, not slowly over a period of time. When you start quitting, get help and support to keep you smoke-free. This information is not intended to replace advice given to you by your health care provider. Make sure you discuss any questions you have with your health care provider. Document Revised: 07/12/2021 Document Reviewed: 07/12/2021 Elsevier Patient Education  2024 [employer].

## 2023-04-27 ENCOUNTER — Other Ambulatory Visit (HOSPITAL_COMMUNITY): Payer: Self-pay | Admitting: Internal Medicine

## 2023-04-27 DIAGNOSIS — R921 Mammographic calcification found on diagnostic imaging of breast: Secondary | ICD-10-CM

## 2023-04-29 ENCOUNTER — Ambulatory Visit: Payer: Medicare Other | Admitting: Internal Medicine

## 2023-04-29 ENCOUNTER — Encounter: Payer: Self-pay | Admitting: Internal Medicine

## 2023-04-29 VITALS — BP 154/70 | HR 64 | Ht 61.0 in | Wt 176.2 lb

## 2023-04-29 DIAGNOSIS — E782 Mixed hyperlipidemia: Secondary | ICD-10-CM

## 2023-04-29 DIAGNOSIS — Z23 Encounter for immunization: Secondary | ICD-10-CM | POA: Diagnosis not present

## 2023-04-29 DIAGNOSIS — J449 Chronic obstructive pulmonary disease, unspecified: Secondary | ICD-10-CM | POA: Diagnosis not present

## 2023-04-29 DIAGNOSIS — Z7984 Long term (current) use of oral hypoglycemic drugs: Secondary | ICD-10-CM

## 2023-04-29 DIAGNOSIS — E1149 Type 2 diabetes mellitus with other diabetic neurological complication: Secondary | ICD-10-CM

## 2023-04-29 DIAGNOSIS — I10 Essential (primary) hypertension: Secondary | ICD-10-CM | POA: Diagnosis not present

## 2023-04-29 DIAGNOSIS — E1151 Type 2 diabetes mellitus with diabetic peripheral angiopathy without gangrene: Secondary | ICD-10-CM

## 2023-04-29 MED ORDER — UMECLIDINIUM BROMIDE 62.5 MCG/ACT IN AEPB: 1.0000 | INHALATION_SPRAY | Freq: Every day | RESPIRATORY_TRACT | 5 refills | Status: AC

## 2023-04-29 MED ORDER — GLIPIZIDE 5 MG PO TABS
5.0000 mg | ORAL_TABLET | Freq: Two times a day (BID) | ORAL | 1 refills | Status: DC
Start: 2023-04-29 — End: 2023-12-22

## 2023-04-29 MED ORDER — GABAPENTIN 100 MG PO CAPS
200.0000 mg | ORAL_CAPSULE | Freq: Every day | ORAL | 1 refills | Status: DC
Start: 2023-04-29 — End: 2023-08-20

## 2023-04-29 MED ORDER — OLMESARTAN MEDOXOMIL 20 MG PO TABS
10.0000 mg | ORAL_TABLET | Freq: Every day | ORAL | 1 refills | Status: DC
Start: 2023-04-29 — End: 2023-05-20

## 2023-04-29 MED ORDER — ROSUVASTATIN CALCIUM 20 MG PO TABS
20.0000 mg | ORAL_TABLET | Freq: Every day | ORAL | 1 refills | Status: DC
Start: 2023-04-29 — End: 2023-08-20

## 2023-04-29 MED ORDER — EMPAGLIFLOZIN 10 MG PO TABS
10.0000 mg | ORAL_TABLET | Freq: Every day | ORAL | 1 refills | Status: DC
Start: 2023-04-29 — End: 2023-08-20

## 2023-04-29 MED ORDER — TIRZEPATIDE 5 MG/0.5ML ~~LOC~~ SOAJ: 5.0000 mg | SUBCUTANEOUS | 1 refills | Status: DC

## 2023-04-29 NOTE — Assessment & Plan Note (Signed)
Well-controlled with Incruse and PRN Albuterol, refilled Has been trying to quit smoking, has tried Chantix, switched to Wellbutrin, but did not help

## 2023-04-29 NOTE — Assessment & Plan Note (Signed)
Foot pain improved with gabapentin 200 mg qHS

## 2023-04-29 NOTE — Patient Instructions (Addendum)
Please start taking Olmesartan 1/2 tablet once daily.  Please start taking Mounjaro 5 mg once weekly instead of Trulicity.  Please continue to take other medications as prescribed.  Please continue to follow low carb diet and perform moderate exercise/walking at least 150 mins/week.

## 2023-04-29 NOTE — Assessment & Plan Note (Signed)
On Crestor, needs to remain compliant

## 2023-04-29 NOTE — Assessment & Plan Note (Addendum)
BP Readings from Last 1 Encounters:  04/29/23 (!) 154/70   Uncontrolled On metoprolol 12.5 mg BID Added olmesartan 10 mg QD Counseled for compliance with the medications Advised DASH diet and walking as tolerated

## 2023-04-29 NOTE — Progress Notes (Signed)
Established Patient Office Visit  Subjective:  Patient ID: Lauren David, female    DOB: Nov 16, 1960  Age: 62 y.o. MRN: [id number]  CC:  Chief Complaint  Patient presents with   Hypertension    Three month follow up    Diabetes    Three month follow up    Dizziness    Patient states she been really dizzy this week     HPI Lauren David is a 62 y.o. female with past medical history of CAD s/p CABG, HTN, COPD, type 2 DM, Osteopenia, chronic left hip pain and tobacco abuse who presents for f/u of her chronic medical conditions.  CAD s/p CABG: She denies any chest pain or dyspnea currently. She is taking Aspirin and statin currently. She is on Metoprolol as well.  HTN: BP is elevated. She has had episodes of dizziness and headache at times. Patient denies headache, chest pain, dyspnea or palpitations. She takes Metoprolol 12.5 mg BID for CAD.  Type 2 DM: She has been taking Glipizide, Trulicity and Jardiance, although has run out of Trulicity. She did not start Mounjaro as she was worried that it would cause diarrhea as Metformin. She agrees to start [facility]. Her HbA1c was 14.1 in 04/24, but has improved to 7.6 now. Her blood glucose has been between 140-200 since starting her medications. Denies any episode of hypoglycemia. She denies any dysuria or hematuria. She has noticed significant improvement in her foot pain with gabapentin. [place] ABI was wnl.  She has cut down smoking and does not smoke everyday now. She has tried Chantix and Wellbutrin, but was not able to quit smoking.    Past Medical History:  Diagnosis Date   Anxiety    Asthma    CAD (coronary artery disease)    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   Chronic back pain    COPD (chronic obstructive pulmonary disease) (HCC)    Depression    Essential hypertension    Headache    Hyperlipidemia    Myocardial infarction (HCC)    Pneumonia    Type 2 diabetes mellitus (HCC)     Past Surgical History:   Procedure Laterality Date   BREAST BIOPSY Left    COLONOSCOPY N/A 04/26/2018   Procedure: COLONOSCOPY;  Surgeon: Malissa Hippo, MD;  Location: [facility];  Service: Endoscopy;  Laterality: N/A;  1:15   CORONARY ARTERY BYPASS GRAFT N/A 05/21/2021   Procedure: CORONARY ARTERY BYPASS GRAFTING (CABG) X4 ON PUMP USING LEFT INTERNAL MAMMARY ARTERY AND ENDOSCOPICALLY HARVESTED RIGHT GREATER SAPHENOUS VEIN;  Surgeon: Corliss Skains, MD;  Location: [facility] OR;  Service: Open Heart Surgery;  Laterality: N/A;   CORONARY PRESSURE/FFR STUDY N/A 05/12/2018   Procedure: INTRAVASCULAR PRESSURE WIRE/FFR STUDY;  Surgeon: Yvonne Kendall, MD;  Location: [facility];  Service: Cardiovascular;  Laterality: N/A;   CORONARY PRESSURE/FFR STUDY N/A 04/22/2021   Procedure: INTRAVASCULAR PRESSURE WIRE/FFR STUDY;  Surgeon: Yvonne Kendall, MD;  Location: [facility];  Service: Cardiovascular;  Laterality: N/A;   ENDOVEIN HARVEST OF GREATER SAPHENOUS VEIN Right 05/21/2021   Procedure: ENDOVEIN HARVEST OF GREATER SAPHENOUS VEIN;  Surgeon: Corliss Skains, MD;  Location: [facility] OR;  Service: Open Heart Surgery;  Laterality: Right;   LEFT HEART CATH AND CORONARY ANGIOGRAPHY N/A 05/12/2018   Procedure: LEFT HEART CATH AND CORONARY ANGIOGRAPHY;  Surgeon: Yvonne Kendall, MD;  [facility];  Service: Cardiovascular;  Laterality: N/A;   LEFT HEART CATH AND  CORONARY ANGIOGRAPHY N/A 04/22/2021   Procedure: LEFT HEART CATH AND CORONARY ANGIOGRAPHY;  Surgeon: Yvonne Kendall, MD;  [facility]: [facility];  Service: Cardiovascular;  Laterality: N/A;   POLYPECTOMY  04/26/2018   Procedure: POLYPECTOMY;  Surgeon: Malissa Hippo, MD;  [facility]: [facility];  Service: Endoscopy;;  colon    RADIAL ARTERY HARVEST Left 05/21/2021   Procedure: RADIAL ARTERY HARVEST;  Surgeon: Corliss Skains, MD;  Location: [facility] OR;  Service: Open Heart Surgery;  Laterality: Left;   TEE WITHOUT CARDIOVERSION N/A  05/21/2021   Procedure: TRANSESOPHAGEAL ECHOCARDIOGRAM (TEE);  Surgeon: Corliss Skains, MD;  Location: [facility] OR;  Service: Open Heart Surgery;  Laterality: N/A;   TOTAL VAGINAL HYSTERECTOMY      Family History  Problem Relation Age of Onset   Stroke Father    Hypertension Father    Breast cancer Paternal Aunt    Breast cancer Paternal Aunt    Breast cancer Paternal Grandmother     Social History   Socioeconomic History   Marital status: Married    Spouse name: Not on file   Number of children: Not on file   Years of education: Not on file   Highest education level: Not on file  Occupational History   Not on file  Tobacco Use   Smoking status: Every Day    Current packs/day: 1.00    Average packs/day: 1 pack/day for 40.0 years (40.0 ttl pk-yrs)    Types: Cigarettes    Passive exposure: Never   Smokeless tobacco: Never   Tobacco comments:    cut back to 1/2 pack a day. Smokig x 67yrs. Did smoke 2 packs x 5 yrs, but cut back a month ago.   Vaping Use   Vaping status: Never Used  Substance and Sexual Activity   Alcohol use: Never   Drug use: Never   Sexual activity: Not on file  Other Topics Concern   Not on file  Social History Narrative   Not on file   Social Determinants of Health   Financial Resource Strain: Low Risk  (04/09/2023)   Overall Financial Resource Strain (CARDIA)    Difficulty of Paying Living Expenses: Not hard at all  Food Insecurity: No Food Insecurity (04/09/2023)   Hunger Vital Sign    Worried About Running Out of Food in the Last Year: Never true    Ran Out of Food in the Last Year: Never true  Transportation Needs: No Transportation Needs (04/09/2023)   PRAPARE - Administrator, Civil Service (Medical): No    Lack of Transportation (Non-Medical): No  Physical Activity: Sufficiently Active (04/09/2023)   Exercise Vital Sign    Days of Exercise per Week: 7 days    Minutes of Exercise per Session: 30 min  Stress: Stress Concern  Present (04/09/2023)   [employer] of Occupational Health - Occupational Stress Questionnaire    Feeling of Stress : To some extent  Social Connections: Moderately Isolated (04/09/2023)   Social Connection and Isolation Panel [NHANES]    Frequency of Communication with Friends and Family: More than three times a week    Frequency of Social Gatherings with Friends and Family: More than three times a week    Attends Religious Services: More than 4 times per year    Active Member of Clubs or Organizations: No    Attends Banker Meetings: Never    Marital Status: Separated  Intimate Partner Violence: Not At Risk (04/09/2023)  Humiliation, Afraid, Rape, and Kick questionnaire    Fear of Current or Ex-Partner: No    Emotionally Abused: No    Physically Abused: No    Sexually Abused: No    Outpatient Medications Prior to Visit  Medication Sig Dispense Refill   acetaminophen (TYLENOL) 500 MG tablet Take 1,000 mg by mouth every 6 (six) hours as needed for moderate pain or headache.     albuterol (PROVENTIL) (2.5 MG/3ML) 0.083% nebulizer solution ONE VIAL BY NEBULIZATION EVERY 6 HOURS AS NEEDED FOR WHEEZING OR SHORTNESS OF BREATH. (Patient taking differently: Take 2.5 mg by nebulization every 6 (six) hours as needed for wheezing.) 180 mL 0   albuterol (VENTOLIN HFA) 108 (90 Base) MCG/ACT inhaler Inhale 2 puffs into the lungs every 4 (four) hours as needed for wheezing or shortness of breath. 18 g 2   aspirin EC 81 MG tablet Take 81 mg by mouth daily. Swallow whole.     blood glucose meter kit and supplies Dispense based on patient and insurance preference. Use up to four times daily as directed. (FOR ICD-10 E10.9, E11.9). 1 each 0   celecoxib (CELEBREX) 200 MG capsule Take 1 capsule (200 mg total) by mouth daily as needed for mild pain or moderate pain. 30 capsule 0   ketoconazole (NIZORAL) 2 % cream Apply 1 Application topically daily. 15 g 0   metoprolol tartrate (LOPRESSOR) 25 MG  tablet Take 0.5 tablets (12.5 mg total) by mouth 2 (two) times daily. 90 tablet 1   nitroGLYCERIN (NITROSTAT) 0.4 MG SL tablet Place 1 tablet (0.4 mg total) under the tongue every 5 (five) minutes x 3 doses as needed for chest pain (If no relief after 3rd dose, GO TO ED). 30 tablet 1   Vitamin D, Ergocalciferol, 50000 units CAPS TAKE 1 CAPSULE BY MOUTH EVERY 7 DAYS. 12 capsule 0   empagliflozin (JARDIANCE) 10 MG TABS tablet Take 1 tablet (10 mg total) by mouth daily. 90 tablet 1   gabapentin (NEURONTIN) 100 MG capsule TAKE 2 CAPSULES AT BEDTIME 180 capsule 0   glipiZIDE (GLUCOTROL) 5 MG tablet Take 1 tablet (5 mg total) by mouth 2 (two) times daily before a meal. 180 tablet 1   rosuvastatin (CRESTOR) 20 MG tablet Take 1 tablet (20 mg total) by mouth daily. 90 tablet 1   tirzepatide (MOUNJARO) 5 MG/0.5ML Pen Inject 5 mg into the skin once a week. 6 mL 1   umeclidinium bromide (INCRUSE ELLIPTA) 62.5 MCG/ACT AEPB Inhale 1 puff into the lungs daily. 30 each 5   No facility-administered medications prior to visit.    Allergies  Allergen Reactions   Percocet [Oxycodone-Acetaminophen] Rash    ROS Review of Systems  Constitutional:  Negative for chills and fever.  HENT:  Negative for congestion, postnasal drip, sinus pressure and sinus pain.   Eyes:  Negative for pain and discharge.  Respiratory:  Positive for cough. Negative for shortness of breath and wheezing.   Cardiovascular:  Negative for chest pain and palpitations.  Gastrointestinal:  Negative for abdominal pain, diarrhea, nausea and vomiting.  Endocrine: Negative for polydipsia and polyuria.  Genitourinary:  Negative for dysuria and hematuria.  Musculoskeletal:  Positive for arthralgias and back pain. Negative for neck pain and neck stiffness.  Skin:  Negative for rash.  Neurological:  Negative for dizziness and weakness.  Psychiatric/Behavioral:  Positive for sleep disturbance. Negative for agitation and behavioral problems.        Objective:    Physical Exam Vitals reviewed.  Constitutional:      General: She is not in acute distress.    Appearance: She is obese. She is not diaphoretic.  HENT:     Head: Normocephalic and atraumatic.     Mouth/Throat:     Mouth: Mucous membranes are moist.  Eyes:     General: No scleral icterus.    Extraocular Movements: Extraocular movements intact.  Cardiovascular:     Rate and Rhythm: Normal rate and regular rhythm.     Heart sounds: Normal heart sounds. No murmur heard. Pulmonary:     Breath sounds: Normal breath sounds. No wheezing or rales.  Musculoskeletal:     Right shoulder: No swelling. Decreased range of motion.     Cervical back: Neck supple. No tenderness.     Right lower leg: No edema.     Left lower leg: No edema.  Skin:    General: Skin is warm.     Findings: No rash.  Neurological:     General: No focal deficit present.     Mental Status: She is alert and oriented to person, place, and time.  Psychiatric:        Mood and Affect: Mood normal.        Behavior: Behavior normal.     BP (!) 154/70 (BP Location: Right Arm)   Pulse 64   Ht 5\' 1"  (1.549 m)   Wt 176 lb 3.2 oz (79.9 kg)   SpO2 97%   BMI 33.29 kg/m  Wt Readings from Last 3 Encounters:  04/29/23 176 lb 3.2 oz (79.9 kg)  04/09/23 170 lb (77.1 kg)  01/28/23 172 lb (78 kg)    Lab Results  Component Value Date   TSH 1.380 01/24/2021   Lab Results  Component Value Date   WBC 10.9 (H) 05/28/2022   HGB 13.2 05/28/2022   HCT 42.3 05/28/2022   MCV 89 05/28/2022   PLT 252 05/28/2022   Lab Results  Component Value Date   NA 143 01/26/2023   K 4.3 01/26/2023   CO2 24 01/26/2023   GLUCOSE 122 (H) 01/26/2023   BUN 11 01/26/2023   CREATININE 0.70 01/26/2023   BILITOT 0.3 01/26/2023   ALKPHOS 84 01/26/2023   AST 16 01/26/2023   ALT 14 01/26/2023   PROT 6.5 01/26/2023   ALBUMIN 4.0 01/26/2023   CALCIUM 9.6 01/26/2023   ANIONGAP 9 05/24/2021   EGFR 97 01/26/2023   Lab Results   Component Value Date   CHOL 130 01/26/2023   Lab Results  Component Value Date   HDL 48 01/26/2023   Lab Results  Component Value Date   LDLCALC 67 01/26/2023   Lab Results  Component Value Date   TRIG 78 01/26/2023   Lab Results  Component Value Date   CHOLHDL 2.7 01/26/2023   Lab Results  Component Value Date   HGBA1C 7.6 (H) 01/26/2023      Assessment & Plan:   Problem List Items Addressed This Visit    Problem List Items Addressed This Visit       Cardiovascular and Mediastinum   Primary hypertension - Primary    BP Readings from Last 1 Encounters:  04/29/23 (!) 154/70   Uncontrolled On metoprolol 12.5 mg BID Added olmesartan 10 mg QD Counseled for compliance with the medications Advised DASH diet and walking as tolerated      Relevant Medications   olmesartan (BENICAR) 20 MG tablet   rosuvastatin (CRESTOR) 20 MG tablet   Other Relevant Orders  Basic Metabolic Panel (BMET)   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)    Lab Results  Component Value Date   HGBA1C 7.6 (H) 01/26/2023   Was uncontrolled due to noncompliance, but improving now Was well-controlled with Trulicity 1.5 mg qw, Glipizide 5 mg BID and Jardiance 10 mg daily Has had difficulty obtaining Trulicity recently, switched to [facility], needs to start taking it Does not tolerate Metformin, 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 - on Gabapentin Advised to follow diabetic diet F/u CMP and lipid panel Diabetic eye exam: Advised to follow up with Ophthalmology for diabetic eye exam      Relevant Medications   olmesartan (BENICAR) 20 MG tablet   empagliflozin (JARDIANCE) 10 MG TABS tablet   glipiZIDE (GLUCOTROL) 5 MG tablet   rosuvastatin (CRESTOR) 20 MG tablet   tirzepatide (MOUNJARO) 5 MG/0.5ML Pen   Other Relevant Orders   Basic Metabolic Panel (BMET)   Hemoglobin A1c     Respiratory   Chronic obstructive pulmonary disease (HCC)    Well-controlled with  Incruse and PRN Albuterol, refilled Has been trying to quit smoking, has tried Chantix, switched to Wellbutrin, but did not help      Relevant Medications   umeclidinium bromide (INCRUSE ELLIPTA) 62.5 MCG/ACT AEPB     Endocrine   Type 2 diabetes mellitus with neurological complications (HCC)    Foot pain improved with gabapentin 200 mg qHS      Relevant Medications   olmesartan (BENICAR) 20 MG tablet   empagliflozin (JARDIANCE) 10 MG TABS tablet   gabapentin (NEURONTIN) 100 MG capsule   glipiZIDE (GLUCOTROL) 5 MG tablet   rosuvastatin (CRESTOR) 20 MG tablet   tirzepatide (MOUNJARO) 5 MG/0.5ML Pen     Other   Mixed hyperlipidemia    On Crestor, needs to remain compliant      Relevant Medications   olmesartan (BENICAR) 20 MG tablet   rosuvastatin (CRESTOR) 20 MG tablet   Other Visit Diagnoses     Encounter for immunization       Relevant Orders   Flu vaccine trivalent PF, 6mos and older(Flulaval,Afluria,Fluarix,Fluzone) (Completed)   Pneumococcal conjugate vaccine 20-valent (Completed)        Meds ordered this encounter  Medications   olmesartan (BENICAR) 20 MG tablet    Sig: Take 0.5 tablets (10 mg total) by mouth daily.    Dispense:  15 tablet    Refill:  1   empagliflozin (JARDIANCE) 10 MG TABS tablet    Sig: Take 1 tablet (10 mg total) by mouth daily.    Dispense:  90 tablet    Refill:  1   gabapentin (NEURONTIN) 100 MG capsule    Sig: Take 2 capsules (200 mg total) by mouth at bedtime.    Dispense:  180 capsule    Refill:  1    NA   glipiZIDE (GLUCOTROL) 5 MG tablet    Sig: Take 1 tablet (5 mg total) by mouth 2 (two) times daily before a meal.    Dispense:  180 tablet    Refill:  1   rosuvastatin (CRESTOR) 20 MG tablet    Sig: Take 1 tablet (20 mg total) by mouth daily.    Dispense:  90 tablet    Refill:  1   tirzepatide (MOUNJARO) 5 MG/0.5ML Pen    Sig: Inject 5 mg into the skin once a week.    Dispense:  6 mL    Refill:  1   umeclidinium bromide  (INCRUSE ELLIPTA)  62.5 MCG/ACT AEPB    Sig: Inhale 1 puff into the lungs daily.    Dispense:  30 each    Refill:  5    Follow-up: Return in about 3 weeks (around 05/20/2023) for HTN and DM.    Anabel Halon, MD

## 2023-04-29 NOTE — Assessment & Plan Note (Addendum)
Lab Results  Component Value Date   HGBA1C 7.6 (H) 01/26/2023   Was uncontrolled due to noncompliance, but improving now Was well-controlled with Trulicity 1.5 mg qw, Glipizide 5 mg BID and Jardiance 10 mg daily Has had difficulty obtaining Trulicity recently, switched to [facility], needs to start taking it Does not tolerate Metformin, 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 - on Gabapentin Advised to follow diabetic diet F/u CMP and lipid panel Diabetic eye exam: Advised to follow up with Ophthalmology for diabetic eye exam

## 2023-05-13 ENCOUNTER — Ambulatory Visit (HOSPITAL_COMMUNITY)
Admission: RE | Admit: 2023-05-13 | Discharge: 2023-05-13 | Disposition: A | Discharge status: Home / Self Care | Payer: Medicare Other | Source: Ambulatory Visit | Attending: Internal Medicine | Admitting: Internal Medicine

## 2023-05-13 DIAGNOSIS — F17219 Nicotine dependence, cigarettes, with unspecified nicotine-induced disorders: Secondary | ICD-10-CM | POA: Insufficient documentation

## 2023-05-14 LAB — BASIC METABOLIC PANEL
BUN/Creatinine Ratio: 13 (ref 12–28)
BUN: 10 mg/dL (ref 8–27)
CO2: 23 mmol/L (ref 20–29)
Calcium: 9.8 mg/dL (ref 8.7–10.3)
Chloride: 102 mmol/L (ref 96–106)
Creatinine, Ser: 0.78 mg/dL (ref 0.57–1.00)
Glucose: 140 mg/dL — ABNORMAL HIGH (ref 70–99)
Potassium: 4.1 mmol/L (ref 3.5–5.2)
Sodium: 143 mmol/L (ref 134–144)
eGFR: 85 mL/min/{1.73_m2} (ref >59)

## 2023-05-14 LAB — HEMOGLOBIN A1C
Est. average glucose Bld gHb Est-mCnc: 140 mg/dL
Hgb A1c MFr Bld: 6.5 % — ABNORMAL HIGH (ref 4.8–5.6)

## 2023-05-20 ENCOUNTER — Encounter: Payer: Self-pay | Admitting: Internal Medicine

## 2023-05-20 ENCOUNTER — Ambulatory Visit (INDEPENDENT_AMBULATORY_CARE_PROVIDER_SITE_OTHER): Payer: Medicare Other | Admitting: Internal Medicine

## 2023-05-20 VITALS — BP 154/72 | HR 80 | Ht 61.0 in | Wt 172.0 lb

## 2023-05-20 DIAGNOSIS — I10 Essential (primary) hypertension: Secondary | ICD-10-CM

## 2023-05-20 DIAGNOSIS — E782 Mixed hyperlipidemia: Secondary | ICD-10-CM

## 2023-05-20 DIAGNOSIS — Z7985 Long-term (current) use of injectable non-insulin antidiabetic drugs: Secondary | ICD-10-CM

## 2023-05-20 DIAGNOSIS — K219 Gastro-esophageal reflux disease without esophagitis: Secondary | ICD-10-CM

## 2023-05-20 DIAGNOSIS — Z7984 Long term (current) use of oral hypoglycemic drugs: Secondary | ICD-10-CM

## 2023-05-20 DIAGNOSIS — E1151 Type 2 diabetes mellitus with diabetic peripheral angiopathy without gangrene: Secondary | ICD-10-CM | POA: Diagnosis not present

## 2023-05-20 DIAGNOSIS — E1149 Type 2 diabetes mellitus with other diabetic neurological complication: Secondary | ICD-10-CM

## 2023-05-20 MED ORDER — OLMESARTAN MEDOXOMIL 20 MG PO TABS
20.0000 mg | ORAL_TABLET | Freq: Every day | ORAL | 0 refills | Status: DC
Start: 2023-05-20 — End: 2023-08-06

## 2023-05-20 MED ORDER — FAMOTIDINE 40 MG PO TABS
40.0000 mg | ORAL_TABLET | Freq: Every day | ORAL | 3 refills | Status: DC
Start: 2023-05-20 — End: 2023-08-20

## 2023-05-20 NOTE — Patient Instructions (Addendum)
Please start taking Olmesartan 20 mg once daily.  Please start taking Pepcid 40 mg once daily for acid reflux.  Please continue to take other medications as prescribed.  Please continue to follow low carb diet and perform moderate exercise/walking at least 150 mins/week.  Please get fasting blood tests done before the next visit.

## 2023-05-20 NOTE — Assessment & Plan Note (Signed)
BP Readings from Last 1 Encounters:  05/20/23 (!) 154/72   Uncontrolled On metoprolol 12.5 mg BID and olmesartan 10 mg once daily Increased dose of Olmesartan 20 mg once daily Counseled for compliance with the medications Advised DASH diet and walking as tolerated

## 2023-05-20 NOTE — Assessment & Plan Note (Signed)
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

## 2023-05-20 NOTE — Progress Notes (Signed)
Established Patient Office Visit  Subjective:  Patient ID: Lauren David, female    DOB: 04-13-1961  Age: 62 y.o. MRN: [id number]  CC:  Chief Complaint  Patient presents with   Hypertension    Three week follow up    Diabetes    Three week follow up    Gastroesophageal Reflux    A lot of heartburn , acid reflux and belching     HPI Lauren David is a 62 y.o. female with past medical history of CAD s/p CABG, HTN, COPD, type 2 DM, Osteopenia, chronic left hip pain and tobacco abuse who presents for f/u of her chronic medical conditions.  CAD s/p CABG: She denies any chest pain or dyspnea currently. She is taking Aspirin and statin currently. She is on Metoprolol as well.  HTN: BP is still elevated. Patient denies chest pain, dyspnea or palpitations. She takes Metoprolol 12.5 mg BID for CAD. She has started Olmesartan 10 mg once daily now.  Type 2 DM: She has been taking Glipizide, Mounjaro and Jardiance. Her HbA1c was 14.1 in 04/24, but has improved to 6.5 now. Her blood glucose has been between 140-200 since starting her medications. Denies any episode of hypoglycemia. She denies any dysuria or hematuria. She has noticed significant improvement in her foot pain with gabapentin. [place] ABI was wnl.  GERD: She has noticed heartburn/acid reflux and belching since starting Mounjaro.  She has mild nausea, but denies any vomiting.  Denies any melena or hematochezia.  She has cut down smoking and does not smoke everyday now. She has tried Chantix and Wellbutrin, but was not able to quit smoking.    Past Medical History:  Diagnosis Date   Anxiety    Asthma    CAD (coronary artery disease)    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   Chronic back pain    COPD (chronic obstructive pulmonary disease) (HCC)    Depression    Essential hypertension    Headache    Hyperlipidemia    Myocardial infarction (HCC)    Pneumonia    Type 2 diabetes mellitus (HCC)     Past Surgical  History:  Procedure Laterality Date   BREAST BIOPSY Left    COLONOSCOPY N/A 04/26/2018   Procedure: COLONOSCOPY;  Surgeon: Malissa Hippo, MD;  Location: [facility];  Service: Endoscopy;  Laterality: N/A;  1:15   CORONARY ARTERY BYPASS GRAFT N/A 05/21/2021   Procedure: CORONARY ARTERY BYPASS GRAFTING (CABG) X4 ON PUMP USING LEFT INTERNAL MAMMARY ARTERY AND ENDOSCOPICALLY HARVESTED RIGHT GREATER SAPHENOUS VEIN;  Surgeon: Corliss Skains, MD;  Location: [facility];  Service: Open Heart Surgery;  Laterality: N/A;   CORONARY PRESSURE/FFR STUDY N/A 05/12/2018   Procedure: INTRAVASCULAR PRESSURE WIRE/FFR STUDY;  Surgeon: Yvonne Kendall, MD;  Location: [facility];  Service: Cardiovascular;  Laterality: N/A;   CORONARY PRESSURE/FFR STUDY N/A 04/22/2021   Procedure: INTRAVASCULAR PRESSURE WIRE/FFR STUDY;  Surgeon: Yvonne Kendall, MD;  Location: [facility];  Service: Cardiovascular;  Laterality: N/A;   ENDOVEIN HARVEST OF GREATER SAPHENOUS VEIN Right 05/21/2021   Procedure: ENDOVEIN HARVEST OF GREATER SAPHENOUS VEIN;  Surgeon: Corliss Skains, MD;  Location: [facility];  Service: Open Heart Surgery;  Laterality: Right;   LEFT HEART CATH AND CORONARY ANGIOGRAPHY N/A 05/12/2018   Procedure: LEFT HEART CATH AND CORONARY ANGIOGRAPHY;  Surgeon: Yvonne Kendall, MD;  Location: [facility];  Service: Cardiovascular;  Laterality: N/A;   LEFT HEART CATH  AND CORONARY ANGIOGRAPHY N/A 04/22/2021   Procedure: LEFT HEART CATH AND CORONARY ANGIOGRAPHY;  Surgeon: Yvonne Kendall, MD;  Location: [facility];  Service: Cardiovascular;  Laterality: N/A;   POLYPECTOMY  04/26/2018   Procedure: POLYPECTOMY;  Surgeon: Malissa Hippo, MD;  Location: [facility];  Service: Endoscopy;;  colon    RADIAL ARTERY HARVEST Left 05/21/2021   Procedure: RADIAL ARTERY HARVEST;  Surgeon: Corliss Skains, MD;  Location: [facility];  Service: Open Heart Surgery;  Laterality: Left;   TEE WITHOUT  CARDIOVERSION N/A 05/21/2021   Procedure: TRANSESOPHAGEAL ECHOCARDIOGRAM (TEE);  Surgeon: Corliss Skains, MD;  Location: [facility] OR;  Service: Open Heart Surgery;  Laterality: N/A;   TOTAL VAGINAL HYSTERECTOMY      Family History  Problem Relation Age of Onset   Stroke Father    Hypertension Father    Breast cancer Paternal Aunt    Breast cancer Paternal Aunt    Breast cancer Paternal Grandmother     Social History   Socioeconomic History   Marital status: Married    Spouse name: Not on file   Number of children: Not on file   Years of education: Not on file   Highest education level: Not on file  Occupational History   Not on file  Tobacco Use   Smoking status: Every Day    Current packs/day: 1.00    Average packs/day: 1 pack/day for 40.0 years (40.0 ttl pk-yrs)    Types: Cigarettes    Passive exposure: Never   Smokeless tobacco: Never   Tobacco comments:    cut back to 1/2 pack a day. Smokig x 4yrs. Did smoke 2 packs x 5 yrs, but cut back a month ago.   Vaping Use   Vaping status: Never Used  Substance and Sexual Activity   Alcohol use: Never   Drug use: Never   Sexual activity: Not on file  Other Topics Concern   Not on file  Social History Narrative   Not on file   Social Determinants of Health   Financial Resource Strain: Low Risk  (04/09/2023)   Overall Financial Resource Strain (CARDIA)    Difficulty of Paying Living Expenses: Not hard at all  Food Insecurity: No Food Insecurity (04/09/2023)   Hunger Vital Sign    Worried About Running Out of Food in the Last Year: Never true    Ran Out of Food in the Last Year: Never true  Transportation Needs: No Transportation Needs (04/09/2023)   PRAPARE - Administrator, Civil Service (Medical): No    Lack of Transportation (Non-Medical): No  Physical Activity: Sufficiently Active (04/09/2023)   Exercise Vital Sign    Days of Exercise per Week: 7 days    Minutes of Exercise per Session: 30 min  Stress:  Stress Concern Present (04/09/2023)   [employer] of Occupational Health - Occupational Stress Questionnaire    Feeling of Stress : To some extent  Social Connections: Moderately Isolated (04/09/2023)   Social Connection and Isolation Panel [NHANES]    Frequency of Communication with Friends and Family: More than three times a week    Frequency of Social Gatherings with Friends and Family: More than three times a week    Attends Religious Services: More than 4 times per year    Active Member of Clubs or Organizations: No    Attends Banker Meetings: Never    Marital Status: Separated  Intimate Partner Violence: Not At Risk (04/09/2023)  Humiliation, Afraid, Rape, and Kick questionnaire    Fear of Current or Ex-Partner: No    Emotionally Abused: No    Physically Abused: No    Sexually Abused: No    Outpatient Medications Prior to Visit  Medication Sig Dispense Refill   acetaminophen (TYLENOL) 500 MG tablet Take 1,000 mg by mouth every 6 (six) hours as needed for moderate pain or headache.     albuterol (PROVENTIL) (2.5 MG/3ML) 0.083% nebulizer solution ONE VIAL BY NEBULIZATION EVERY 6 HOURS AS NEEDED FOR WHEEZING OR SHORTNESS OF BREATH. (Patient taking differently: Take 2.5 mg by nebulization every 6 (six) hours as needed for wheezing.) 180 mL 0   albuterol (VENTOLIN HFA) 108 (90 Base) MCG/ACT inhaler Inhale 2 puffs into the lungs every 4 (four) hours as needed for wheezing or shortness of breath. 18 g 2   aspirin EC 81 MG tablet Take 81 mg by mouth daily. Swallow whole.     blood glucose meter kit and supplies Dispense based on patient and insurance preference. Use up to four times daily as directed. (FOR ICD-10 E10.9, E11.9). 1 each 0   celecoxib (CELEBREX) 200 MG capsule Take 1 capsule (200 mg total) by mouth daily as needed for mild pain or moderate pain. 30 capsule 0   empagliflozin (JARDIANCE) 10 MG TABS tablet Take 1 tablet (10 mg total) by mouth daily. 90 tablet 1    gabapentin (NEURONTIN) 100 MG capsule Take 2 capsules (200 mg total) by mouth at bedtime. 180 capsule 1   glipiZIDE (GLUCOTROL) 5 MG tablet Take 1 tablet (5 mg total) by mouth 2 (two) times daily before a meal. 180 tablet 1   ketoconazole (NIZORAL) 2 % cream Apply 1 Application topically daily. 15 g 0   metoprolol tartrate (LOPRESSOR) 25 MG tablet Take 0.5 tablets (12.5 mg total) by mouth 2 (two) times daily. 90 tablet 1   nitroGLYCERIN (NITROSTAT) 0.4 MG SL tablet Place 1 tablet (0.4 mg total) under the tongue every 5 (five) minutes x 3 doses as needed for chest pain (If no relief after 3rd dose, GO TO ED). 30 tablet 1   rosuvastatin (CRESTOR) 20 MG tablet Take 1 tablet (20 mg total) by mouth daily. 90 tablet 1   tirzepatide (MOUNJARO) 5 MG/0.5ML Pen Inject 5 mg into the skin once a week. 6 mL 1   umeclidinium bromide (INCRUSE ELLIPTA) 62.5 MCG/ACT AEPB Inhale 1 puff into the lungs daily. 30 each 5   Vitamin D, Ergocalciferol, 50000 units CAPS TAKE 1 CAPSULE BY MOUTH EVERY 7 DAYS. 12 capsule 0   olmesartan (BENICAR) 20 MG tablet Take 0.5 tablets (10 mg total) by mouth daily. 15 tablet 1   No facility-administered medications prior to visit.    Allergies  Allergen Reactions   Percocet [Oxycodone-Acetaminophen] Rash    ROS Review of Systems  Constitutional:  Negative for chills and fever.  HENT:  Negative for congestion, postnasal drip, sinus pressure and sinus pain.   Eyes:  Negative for pain and discharge.  Respiratory:  Negative for cough, shortness of breath and wheezing.   Cardiovascular:  Negative for chest pain and palpitations.  Gastrointestinal:  Negative for abdominal pain, diarrhea, nausea and vomiting.  Endocrine: Negative for polydipsia and polyuria.  Genitourinary:  Negative for dysuria and hematuria.  Musculoskeletal:  Positive for arthralgias and back pain. Negative for neck pain and neck stiffness.  Skin:  Negative for rash.  Neurological:  Negative for dizziness and  weakness.  Psychiatric/Behavioral:  Positive for sleep  disturbance. Negative for agitation and behavioral problems.       Objective:    Physical Exam Vitals reviewed.  Constitutional:      General: She is not in acute distress.    Appearance: She is obese. She is not diaphoretic.  HENT:     Head: Normocephalic and atraumatic.     Mouth/Throat:     Mouth: Mucous membranes are moist.  Eyes:     General: No scleral icterus.    Extraocular Movements: Extraocular movements intact.  Cardiovascular:     Rate and Rhythm: Normal rate and regular rhythm.     Heart sounds: Normal heart sounds. No murmur heard. Pulmonary:     Breath sounds: Normal breath sounds. No wheezing or rales.  Abdominal:     Palpations: Abdomen is soft.     Tenderness: There is abdominal tenderness (Mild, epigastric).  Musculoskeletal:     Cervical back: Neck supple. No tenderness.     Right lower leg: No edema.     Left lower leg: No edema.  Skin:    General: Skin is warm.     Findings: No rash.  Neurological:     General: No focal deficit present.     Mental Status: She is alert and oriented to person, place, and time.  Psychiatric:        Mood and Affect: Mood normal.        Behavior: Behavior normal.     BP (!) 154/72 (BP Location: Left Arm)   Pulse 80   Ht 5\' 1"  (1.549 m)   Wt 172 lb (78 kg)   SpO2 96%   BMI 32.50 kg/m  Wt Readings from Last 3 Encounters:  05/20/23 172 lb (78 kg)  04/29/23 176 lb 3.2 oz (79.9 kg)  04/09/23 170 lb (77.1 kg)    Lab Results  Component Value Date   TSH 1.380 01/24/2021   Lab Results  Component Value Date   WBC 10.9 (H) 05/28/2022   HGB 13.2 05/28/2022   HCT 42.3 05/28/2022   MCV 89 05/28/2022   PLT 252 05/28/2022   Lab Results  Component Value Date   NA 143 05/13/2023   K 4.1 05/13/2023   CO2 23 05/13/2023   GLUCOSE 140 (H) 05/13/2023   BUN 10 05/13/2023   CREATININE 0.78 05/13/2023   BILITOT 0.3 01/26/2023   ALKPHOS 84 01/26/2023   AST 16  01/26/2023   ALT 14 01/26/2023   PROT 6.5 01/26/2023   ALBUMIN 4.0 01/26/2023   CALCIUM 9.8 05/13/2023   ANIONGAP 9 05/24/2021   EGFR 85 05/13/2023   Lab Results  Component Value Date   CHOL 130 01/26/2023   Lab Results  Component Value Date   HDL 48 01/26/2023   Lab Results  Component Value Date   LDLCALC 67 01/26/2023   Lab Results  Component Value Date   TRIG 78 01/26/2023   Lab Results  Component Value Date   CHOLHDL 2.7 01/26/2023   Lab Results  Component Value Date   HGBA1C 6.5 (H) 05/13/2023      Assessment & Plan:   Problem List Items Addressed This Visit    Problem List Items Addressed This Visit       Cardiovascular and Mediastinum   Primary hypertension - Primary    BP Readings from Last 1 Encounters:  05/20/23 (!) 154/72   Uncontrolled On metoprolol 12.5 mg BID and olmesartan 10 mg once daily Increased dose of Olmesartan 20 mg once daily Counseled for  compliance with the medications Advised DASH diet and walking as tolerated      Relevant Medications   olmesartan (BENICAR) 20 MG tablet   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)    Lab Results  Component Value Date   HGBA1C 6.5 (H) 05/13/2023   Well-controlled On Mounjaro 5 mg qw, Glipizide 5 mg BID and Jardiance 10 mg daily Does not tolerate Metformin, 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 - on Gabapentin Advised to follow diabetic diet F/u CMP and lipid panel Diabetic eye exam: Advised to follow up with Ophthalmology for diabetic eye exam      Relevant Medications   olmesartan (BENICAR) 20 MG tablet   Other Relevant Orders   CMP14+EGFR   Hemoglobin A1c     Digestive   Gastroesophageal reflux disease    Started Pepcid 40 mg once daily Likely effect from [facility]      Relevant Medications   famotidine (PEPCID) 40 MG tablet     Endocrine   Type 2 diabetes mellitus with neurological complications (HCC)    Foot pain improved with gabapentin 200 mg  qHS      Relevant Medications   olmesartan (BENICAR) 20 MG tablet     Other   Mixed hyperlipidemia    On Crestor, needs to remain compliant      Relevant Medications   olmesartan (BENICAR) 20 MG tablet   Other Relevant Orders   Lipid Profile     Meds ordered this encounter  Medications   olmesartan (BENICAR) 20 MG tablet    Sig: Take 1 tablet (20 mg total) by mouth daily.    Dispense:  90 tablet    Refill:  0    DOSE CHANGE   famotidine (PEPCID) 40 MG tablet    Sig: Take 1 tablet (40 mg total) by mouth daily.    Dispense:  30 tablet    Refill:  3    Follow-up: Return in about 3 months (around 08/20/2023) for DM and HTN.    Anabel Halon, MD

## 2023-05-20 NOTE — Assessment & Plan Note (Signed)
Started Pepcid 40 mg once daily Likely effect from [facility]

## 2023-05-20 NOTE — Assessment & Plan Note (Signed)
Foot pain improved with gabapentin 200 mg qHS

## 2023-05-20 NOTE — Assessment & Plan Note (Signed)
On Crestor, needs to remain compliant

## 2023-05-26 ENCOUNTER — Ambulatory Visit (HOSPITAL_COMMUNITY): Payer: Medicare Other

## 2023-05-26 ENCOUNTER — Encounter (HOSPITAL_COMMUNITY): Payer: Medicare Other

## 2023-06-11 ENCOUNTER — Ambulatory Visit (HOSPITAL_COMMUNITY): Payer: Medicare Other

## 2023-06-11 ENCOUNTER — Inpatient Hospital Stay (HOSPITAL_COMMUNITY): Admission: RE | Admit: 2023-06-11 | Payer: Medicare Other | Source: Ambulatory Visit

## 2023-07-14 ENCOUNTER — Ambulatory Visit (HOSPITAL_COMMUNITY)
Admission: RE | Admit: 2023-07-14 | Discharge: 2023-07-14 | Disposition: A | Discharge status: Home / Self Care | Payer: Medicare Other | Source: Ambulatory Visit | Attending: Internal Medicine

## 2023-07-14 ENCOUNTER — Encounter (HOSPITAL_COMMUNITY): Payer: Self-pay

## 2023-07-14 ENCOUNTER — Ambulatory Visit (HOSPITAL_COMMUNITY)
Admission: RE | Admit: 2023-07-14 | Discharge: 2023-07-14 | Disposition: A | Discharge status: Home / Self Care | Payer: Medicare Other | Source: Ambulatory Visit | Attending: Internal Medicine | Admitting: Internal Medicine

## 2023-07-14 DIAGNOSIS — R921 Mammographic calcification found on diagnostic imaging of breast: Secondary | ICD-10-CM | POA: Diagnosis present

## 2023-08-06 ENCOUNTER — Other Ambulatory Visit: Payer: Self-pay | Admitting: Internal Medicine

## 2023-08-06 DIAGNOSIS — I10 Essential (primary) hypertension: Secondary | ICD-10-CM

## 2023-08-20 ENCOUNTER — Ambulatory Visit (INDEPENDENT_AMBULATORY_CARE_PROVIDER_SITE_OTHER): Payer: 59 | Admitting: Internal Medicine

## 2023-08-20 ENCOUNTER — Encounter: Payer: Self-pay | Admitting: Internal Medicine

## 2023-08-20 VITALS — BP 122/68 | HR 80 | Ht 61.0 in | Wt 168.6 lb

## 2023-08-20 DIAGNOSIS — E1149 Type 2 diabetes mellitus with other diabetic neurological complication: Secondary | ICD-10-CM | POA: Diagnosis not present

## 2023-08-20 DIAGNOSIS — M65332 Trigger finger, left middle finger: Secondary | ICD-10-CM | POA: Diagnosis not present

## 2023-08-20 DIAGNOSIS — E782 Mixed hyperlipidemia: Secondary | ICD-10-CM | POA: Diagnosis not present

## 2023-08-20 DIAGNOSIS — G8929 Other chronic pain: Secondary | ICD-10-CM

## 2023-08-20 DIAGNOSIS — I10 Essential (primary) hypertension: Secondary | ICD-10-CM

## 2023-08-20 DIAGNOSIS — E559 Vitamin D deficiency, unspecified: Secondary | ICD-10-CM | POA: Diagnosis not present

## 2023-08-20 DIAGNOSIS — M25552 Pain in left hip: Secondary | ICD-10-CM | POA: Diagnosis not present

## 2023-08-20 DIAGNOSIS — E1151 Type 2 diabetes mellitus with diabetic peripheral angiopathy without gangrene: Secondary | ICD-10-CM

## 2023-08-20 DIAGNOSIS — K219 Gastro-esophageal reflux disease without esophagitis: Secondary | ICD-10-CM | POA: Diagnosis not present

## 2023-08-20 DIAGNOSIS — I25119 Atherosclerotic heart disease of native coronary artery with unspecified angina pectoris: Secondary | ICD-10-CM

## 2023-08-20 DIAGNOSIS — Z0001 Encounter for general adult medical examination with abnormal findings: Secondary | ICD-10-CM

## 2023-08-20 DIAGNOSIS — M25551 Pain in right hip: Secondary | ICD-10-CM

## 2023-08-20 MED ORDER — OLMESARTAN MEDOXOMIL 20 MG PO TABS: 20.0000 mg | ORAL_TABLET | Freq: Every day | ORAL | 0 refills | Status: DC

## 2023-08-20 MED ORDER — METOPROLOL TARTRATE 25 MG PO TABS: 12.5000 mg | ORAL_TABLET | Freq: Two times a day (BID) | ORAL | 1 refills | Status: DC

## 2023-08-20 MED ORDER — EMPAGLIFLOZIN 10 MG PO TABS
10.0000 mg | ORAL_TABLET | Freq: Every day | ORAL | 1 refills | Status: DC
Start: 2023-08-20 — End: 2024-03-23

## 2023-08-20 MED ORDER — FAMOTIDINE 40 MG PO TABS: 40.0000 mg | ORAL_TABLET | Freq: Every day | ORAL | 3 refills | Status: AC

## 2023-08-20 MED ORDER — GABAPENTIN 100 MG PO CAPS
200.0000 mg | ORAL_CAPSULE | Freq: Every day | ORAL | 1 refills | Status: DC
Start: 2023-08-20 — End: 2024-03-23

## 2023-08-20 MED ORDER — CELECOXIB 200 MG PO CAPS: 200.0000 mg | ORAL_CAPSULE | Freq: Every day | ORAL | 0 refills | Status: AC | PRN

## 2023-08-20 MED ORDER — ROSUVASTATIN CALCIUM 20 MG PO TABS: 20.0000 mg | ORAL_TABLET | Freq: Every day | ORAL | 1 refills | Status: DC

## 2023-08-20 NOTE — Assessment & Plan Note (Addendum)
S/p CABG On aspirin and statin On Metoprolol Followed by cardiology No chest pain or dyspnea currently

## 2023-08-20 NOTE — Assessment & Plan Note (Signed)
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

## 2023-08-20 NOTE — Assessment & Plan Note (Signed)
Likely arthritis Referred to Orthopedic surgery Celebrex PRN for short-term, alternate with Tylenol PRN

## 2023-08-20 NOTE — Assessment & Plan Note (Signed)
Physical exam as documented. Fasting blood tests ordered today. Advised to get Shingrix and Tdap vaccines at local pharmacy.

## 2023-08-20 NOTE — Assessment & Plan Note (Signed)
On Pepcid 40 mg once daily Likely effect from [facility]

## 2023-08-20 NOTE — Progress Notes (Signed)
Established Patient Office Visit  Subjective:  Patient ID: Lauren David, female    DOB: Dec 24, 1960  Age: 63 y.o. MRN: [id number]  CC:  Chief Complaint  Patient presents with   Care Management    3 month f/u , wants to discuss hip pain and left hand pain.    Hypertension   Diabetes    HPI Lauren David is a 63 y.o. female with past medical history of CAD s/p CABG, HTN, COPD, type 2 DM, Osteopenia, chronic left hip pain and tobacco abuse who presents for f/u of her chronic medical conditions.  CAD s/p CABG: She denies any chest pain or dyspnea currently. She is taking Aspirin and statin currently. She is on Metoprolol as well.  HTN: BP is still elevated. Patient denies chest pain, dyspnea or palpitations. She takes Metoprolol 12.5 mg BID for CAD. She has started Olmesartan 10 mg once daily now.  Type 2 DM: She has been taking Glipizide, Mounjaro and Jardiance. Her HbA1c was 14.1 in 04/24, but has improved to 6.5 now. Her blood glucose has been between 120-150 since starting her medications. Denies any episode of hypoglycemia. She denies any dysuria or hematuria. She has noticed significant improvement in her foot pain with gabapentin. [place] ABI was wnl.  GERD: She has noticed heartburn/acid reflux and belching since starting Mounjaro, but is better with Pepcid now.  She has mild nausea, but denies any vomiting.  Denies any melena or hematochezia.  She has cut down smoking and does not smoke everyday now. She has tried Chantix and Wellbutrin, but was not able to quit smoking.  She reports chronic bilateral hip pain, L > R.  She denies any recent injury or fall.  Pain is constant, dull, worse with walking and better with Celebrex.  She also reports pain of the middle finger of left hand, and inability to bend it completely.  She has noticed pain in the tendon of the hand as well.    Past Medical History:  Diagnosis Date   Anxiety    Asthma    CAD (coronary artery disease)     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   Chronic back pain    COPD (chronic obstructive pulmonary disease) (HCC)    Depression    Essential hypertension    Headache    Hyperlipidemia    Myocardial infarction (HCC)    Pneumonia    Type 2 diabetes mellitus (HCC)     Past Surgical History:  Procedure Laterality Date   BREAST BIOPSY Left 2016   fibroadenoma with calcifications and fibrocystic changes   COLONOSCOPY N/A 04/26/2018   Procedure: COLONOSCOPY;  Surgeon: Malissa Hippo, MD;  Location: [facility];  Service: Endoscopy;  Laterality: N/A;  1:15   CORONARY ARTERY BYPASS GRAFT N/A 05/21/2021   Procedure: CORONARY ARTERY BYPASS GRAFTING (CABG) X4 ON PUMP USING LEFT INTERNAL MAMMARY ARTERY AND ENDOSCOPICALLY HARVESTED RIGHT GREATER SAPHENOUS VEIN;  Surgeon: Corliss Skains, MD;  Location: [facility] OR;  Service: Open Heart Surgery;  Laterality: N/A;   CORONARY PRESSURE/FFR STUDY N/A 05/12/2018   Procedure: INTRAVASCULAR PRESSURE WIRE/FFR STUDY;  Surgeon: Yvonne Kendall, MD;  Location: [facility];  Service: Cardiovascular;  Laterality: N/A;   CORONARY PRESSURE/FFR STUDY N/A 04/22/2021   Procedure: INTRAVASCULAR PRESSURE WIRE/FFR STUDY;  Surgeon: Yvonne Kendall, MD;  Location: [facility];  Service: Cardiovascular;  Laterality: N/A;   ENDOVEIN HARVEST OF GREATER SAPHENOUS VEIN Right 05/21/2021   Procedure: ENDOVEIN HARVEST  OF GREATER SAPHENOUS VEIN;  Surgeon: Corliss Skains, MD;  Location: [facility] OR;  Service: Open Heart Surgery;  Laterality: Right;   LEFT HEART CATH AND CORONARY ANGIOGRAPHY N/A 05/12/2018   Procedure: LEFT HEART CATH AND CORONARY ANGIOGRAPHY;  Surgeon: Yvonne Kendall, MD;  Location: [facility];  Service: Cardiovascular;  Laterality: N/A;   LEFT HEART CATH AND CORONARY ANGIOGRAPHY N/A 04/22/2021   Procedure: LEFT HEART CATH AND CORONARY ANGIOGRAPHY;  Surgeon: Yvonne Kendall, MD;  Location: [facility];  Service:  Cardiovascular;  Laterality: N/A;   POLYPECTOMY  04/26/2018   Procedure: POLYPECTOMY;  Surgeon: Malissa Hippo, MD;  Location: [facility];  Service: Endoscopy;;  colon    RADIAL ARTERY HARVEST Left 05/21/2021   Procedure: RADIAL ARTERY HARVEST;  Surgeon: Corliss Skains, MD;  Location: [facility] OR;  Service: Open Heart Surgery;  Laterality: Left;   TEE WITHOUT CARDIOVERSION N/A 05/21/2021   Procedure: TRANSESOPHAGEAL ECHOCARDIOGRAM (TEE);  Surgeon: Corliss Skains, MD;  [facility];  Service: Open Heart Surgery;  Laterality: N/A;   TOTAL VAGINAL HYSTERECTOMY      Family History  Problem Relation Age of Onset   Stroke Father    Hypertension Father    Breast cancer Paternal Aunt    Breast cancer Paternal Aunt    Breast cancer Paternal Grandmother     Social History   Socioeconomic History   Marital status: Married    Spouse name: Not on file   Number of children: Not on file   Years of education: Not on file   Highest education level: Not on file  Occupational History   Not on file  Tobacco Use   Smoking status: Every Day    Current packs/day: 1.00    Average packs/day: 1 pack/day for 40.0 years (40.0 ttl pk-yrs)    Types: Cigarettes    Passive exposure: Never   Smokeless tobacco: Never   Tobacco comments:    cut back to 1/2 pack a day. Smokig x 36yrs. Did smoke 2 packs x 5 yrs, but cut back a month ago.   Vaping Use   Vaping status: Never Used  Substance and Sexual Activity   Alcohol use: Never   Drug use: Never   Sexual activity: Not on file  Other Topics Concern   Not on file  Social History Narrative   Not on file   Social Drivers of Health   Financial Resource Strain: Low Risk  (04/09/2023)   Overall Financial Resource Strain (CARDIA)    Difficulty of Paying Living Expenses: Not hard at all  Food Insecurity: No Food Insecurity (04/09/2023)   Hunger Vital Sign    Worried About Running Out of Food in the Last Year: Never true    Ran Out of Food in  the Last Year: Never true  Transportation Needs: No Transportation Needs (04/09/2023)   PRAPARE - Administrator, Civil Service (Medical): No    Lack of Transportation (Non-Medical): No  Physical Activity: Sufficiently Active (04/09/2023)   Exercise Vital Sign    Days of Exercise per Week: 7 days    Minutes of Exercise per Session: 30 min  Stress: Stress Concern Present (04/09/2023)   [employer] of Occupational Health - Occupational Stress Questionnaire    Feeling of Stress : To some extent  Social Connections: Moderately Isolated (04/09/2023)   Social Connection and Isolation Panel [NHANES]    Frequency of Communication with Friends and Family: More than three times a  week    Frequency of Social Gatherings with Friends and Family: More than three times a week    Attends Religious Services: More than 4 times per year    Active Member of [employer] or Organizations: No    Attends Banker Meetings: Never    Marital Status: Separated  Intimate Partner Violence: Not At Risk (04/09/2023)   Humiliation, Afraid, Rape, and Kick questionnaire    Fear of Current or Ex-Partner: No    Emotionally Abused: No    Physically Abused: No    Sexually Abused: No    Outpatient Medications Prior to Visit  Medication Sig Dispense Refill   acetaminophen (TYLENOL) 500 MG tablet Take 1,000 mg by mouth every 6 (six) hours as needed for moderate pain or headache.     albuterol (PROVENTIL) (2.5 MG/3ML) 0.083% nebulizer solution ONE VIAL BY NEBULIZATION EVERY 6 HOURS AS NEEDED FOR WHEEZING OR SHORTNESS OF BREATH. (Patient taking differently: Take 2.5 mg by nebulization every 6 (six) hours as needed for wheezing.) 180 mL 0   albuterol (VENTOLIN HFA) 108 (90 Base) MCG/ACT inhaler Inhale 2 puffs into the lungs every 4 (four) hours as needed for wheezing or shortness of breath. 18 g 2   aspirin EC 81 MG tablet Take 81 mg by mouth daily. Swallow whole.     blood glucose meter kit and supplies Dispense  based on patient and insurance preference. Use up to four times daily as directed. (FOR ICD-10 E10.9, E11.9). 1 each 0   glipiZIDE (GLUCOTROL) 5 MG tablet Take 1 tablet (5 mg total) by mouth 2 (two) times daily before a meal. 180 tablet 1   ketoconazole (NIZORAL) 2 % cream Apply 1 Application topically daily. 15 g 0   tirzepatide (MOUNJARO) 5 MG/0.5ML Pen Inject 5 mg into the skin once a week. 6 mL 1   umeclidinium bromide (INCRUSE ELLIPTA) 62.5 MCG/ACT AEPB Inhale 1 puff into the lungs daily. 30 each 5   Vitamin D, Ergocalciferol, 50000 units CAPS TAKE 1 CAPSULE BY MOUTH EVERY 7 DAYS. 12 capsule 0   celecoxib (CELEBREX) 200 MG capsule Take 1 capsule (200 mg total) by mouth daily as needed for mild pain or moderate pain. 30 capsule 0   empagliflozin (JARDIANCE) 10 MG TABS tablet Take 1 tablet (10 mg total) by mouth daily. 90 tablet 1   famotidine (PEPCID) 40 MG tablet Take 1 tablet (40 mg total) by mouth daily. 30 tablet 3   gabapentin (NEURONTIN) 100 MG capsule Take 2 capsules (200 mg total) by mouth at bedtime. 180 capsule 1   metoprolol tartrate (LOPRESSOR) 25 MG tablet Take 0.5 tablets (12.5 mg total) by mouth 2 (two) times daily. 90 tablet 1   olmesartan (BENICAR) 20 MG tablet Take 1 tablet by mouth once daily 90 tablet 0   rosuvastatin (CRESTOR) 20 MG tablet Take 1 tablet (20 mg total) by mouth daily. 90 tablet 1   nitroGLYCERIN (NITROSTAT) 0.4 MG SL tablet Place 1 tablet (0.4 mg total) under the tongue every 5 (five) minutes x 3 doses as needed for chest pain (If no relief after 3rd dose, GO TO ED). 30 tablet 1   No facility-administered medications prior to visit.    Allergies  Allergen Reactions   Percocet [Oxycodone-Acetaminophen] Rash    ROS Review of Systems  Constitutional:  Negative for chills and fever.  HENT:  Negative for congestion, postnasal drip, sinus pressure and sinus pain.   Eyes:  Negative for pain and discharge.  Respiratory:  Negative for cough, shortness of  breath and wheezing.   Cardiovascular:  Negative for chest pain and palpitations.  Gastrointestinal:  Negative for abdominal pain, diarrhea, nausea and vomiting.  Endocrine: Negative for polydipsia and polyuria.  Genitourinary:  Negative for dysuria and hematuria.  Musculoskeletal:  Positive for arthralgias (B/l hip, right shoulder) and back pain. Negative for neck pain and neck stiffness.  Skin:  Negative for rash.  Neurological:  Negative for dizziness and weakness.  Psychiatric/Behavioral:  Positive for sleep disturbance. Negative for agitation and behavioral problems.       Objective:    Physical Exam Vitals reviewed.  Constitutional:      General: She is not in acute distress.    Appearance: She is obese. She is not diaphoretic.  HENT:     Head: Normocephalic and atraumatic.     Mouth/Throat:     Mouth: Mucous membranes are moist.  Eyes:     General: No scleral icterus.    Extraocular Movements: Extraocular movements intact.  Cardiovascular:     Rate and Rhythm: Normal rate and regular rhythm.     Heart sounds: Normal heart sounds. No murmur heard. Pulmonary:     Breath sounds: Normal breath sounds. No wheezing or rales.  Abdominal:     Palpations: Abdomen is soft.     Tenderness: There is no abdominal tenderness.  Musculoskeletal:     Cervical back: Neck supple. No tenderness.     Right hip: No tenderness. Normal range of motion.     Left hip: No tenderness. Normal range of motion.     Right lower leg: No edema.     Left lower leg: No edema.     Comments: Trigger finger of left hand middle finger  Skin:    General: Skin is warm.     Findings: No rash.  Neurological:     General: No focal deficit present.     Mental Status: She is alert and oriented to person, place, and time.     Cranial Nerves: No cranial nerve deficit.     Sensory: No sensory deficit.     Motor: No weakness.  Psychiatric:        Mood and Affect: Mood normal.        Behavior: Behavior  normal.     BP 122/68 (BP Location: Left Arm)   Pulse 80   Ht 5\' 1"  (1.549 m)   Wt 168 lb 9.6 oz (76.5 kg)   SpO2 96%   BMI 31.86 kg/m  Wt Readings from Last 3 Encounters:  08/20/23 168 lb 9.6 oz (76.5 kg)  05/20/23 172 lb (78 kg)  04/29/23 176 lb 3.2 oz (79.9 kg)    Lab Results  Component Value Date   TSH 1.380 01/24/2021   Lab Results  Component Value Date   WBC 10.9 (H) 05/28/2022   HGB 13.2 05/28/2022   HCT 42.3 05/28/2022   MCV 89 05/28/2022   PLT 252 05/28/2022   Lab Results  Component Value Date   NA 143 05/13/2023   K 4.1 05/13/2023   CO2 23 05/13/2023   GLUCOSE 140 (H) 05/13/2023   BUN 10 05/13/2023   CREATININE 0.78 05/13/2023   BILITOT 0.3 01/26/2023   ALKPHOS 84 01/26/2023   AST 16 01/26/2023   ALT 14 01/26/2023   PROT 6.5 01/26/2023   ALBUMIN 4.0 01/26/2023   CALCIUM 9.8 05/13/2023   ANIONGAP 9 05/24/2021   EGFR 85 05/13/2023   Lab Results  Component Value Date  CHOL 130 01/26/2023   Lab Results  Component Value Date   HDL 48 01/26/2023   Lab Results  Component Value Date   LDLCALC 67 01/26/2023   Lab Results  Component Value Date   TRIG 78 01/26/2023   Lab Results  Component Value Date   CHOLHDL 2.7 01/26/2023   Lab Results  Component Value Date   HGBA1C 6.5 (H) 05/13/2023      Assessment & Plan:   Problem List Items Addressed This Visit    Problem List Items Addressed This Visit       Cardiovascular and Mediastinum   Primary hypertension   Relevant Medications   metoprolol tartrate (LOPRESSOR) 25 MG tablet   olmesartan (BENICAR) 20 MG tablet   rosuvastatin (CRESTOR) 20 MG tablet   Other Relevant Orders   TSH   CBC with Differential/Platelet   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)   Lab Results  Component Value Date   HGBA1C 6.5 (H) 05/13/2023   Well-controlled On Mounjaro 5 mg qw, Glipizide 5 mg BID and Jardiance 10 mg daily Does not tolerate Metformin, has severe  diarrhea has neuropathy - on Gabapentin Advised to follow diabetic diet F/u CMP and lipid panel Diabetic eye exam: Advised to follow up with Ophthalmology for diabetic eye exam      Relevant Medications   empagliflozin (JARDIANCE) 10 MG TABS tablet   metoprolol tartrate (LOPRESSOR) 25 MG tablet   olmesartan (BENICAR) 20 MG tablet   rosuvastatin (CRESTOR) 20 MG tablet   Other Relevant Orders   Bayer DCA Hb A1c Waived   Coronary artery disease   S/p CABG On aspirin and statin On Metoprolol Followed by cardiology No chest pain or dyspnea currently      Relevant Medications   metoprolol tartrate (LOPRESSOR) 25 MG tablet   olmesartan (BENICAR) 20 MG tablet   rosuvastatin (CRESTOR) 20 MG tablet   Other Relevant Orders   TSH     Digestive   Gastroesophageal reflux disease   On Pepcid 40 mg once daily Likely effect from [facility]      Relevant Medications   famotidine (PEPCID) 40 MG tablet     Endocrine   Type 2 diabetes mellitus with neurological complications (HCC)   Foot pain improved with gabapentin 200 mg qHS      Relevant Medications   empagliflozin (JARDIANCE) 10 MG TABS tablet   gabapentin (NEURONTIN) 100 MG capsule   olmesartan (BENICAR) 20 MG tablet   rosuvastatin (CRESTOR) 20 MG tablet   Other Relevant Orders   Hemoglobin A1c   CMP14+EGFR   Urine Microalbumin w/creat. ratio     Musculoskeletal and Integument   Trigger middle finger of left hand   Inability to flex left hand middle finger -likely due to trigger finger Referred to orthopedic surgery      Relevant Orders   Ambulatory referral to Orthopedic Surgery     Other   Chronic hip pain, bilateral   Likely arthritis Referred to Orthopedic surgery Celebrex PRN for short-term, alternate with Tylenol PRN      Relevant Medications   gabapentin (NEURONTIN) 100 MG capsule   celecoxib (CELEBREX) 200 MG capsule   Other Relevant Orders   Ambulatory referral to Orthopedic Surgery   Mixed  hyperlipidemia   Relevant Medications   metoprolol tartrate (LOPRESSOR) 25 MG tablet   olmesartan (BENICAR) 20 MG tablet   rosuvastatin (CRESTOR) 20 MG tablet   Other Relevant Orders   Lipid panel   Encounter for  general adult medical examination with abnormal findings - Primary   Physical exam as documented. Fasting blood tests ordered today. Advised to get Shingrix and Tdap vaccines at local pharmacy.      Other Visit Diagnoses       Vitamin D deficiency       Relevant Orders   VITAMIN D 25 Hydroxy (Vit-D Deficiency, Fractures)        Meds ordered this encounter  Medications   empagliflozin (JARDIANCE) 10 MG TABS tablet    Sig: Take 1 tablet (10 mg total) by mouth daily.    Dispense:  90 tablet    Refill:  1   famotidine (PEPCID) 40 MG tablet    Sig: Take 1 tablet (40 mg total) by mouth daily.    Dispense:  90 tablet    Refill:  3   gabapentin (NEURONTIN) 100 MG capsule    Sig: Take 2 capsules (200 mg total) by mouth at bedtime.    Dispense:  180 capsule    Refill:  1    NA   metoprolol tartrate (LOPRESSOR) 25 MG tablet    Sig: Take 0.5 tablets (12.5 mg total) by mouth 2 (two) times daily.    Dispense:  90 tablet    Refill:  1   olmesartan (BENICAR) 20 MG tablet    Sig: Take 1 tablet (20 mg total) by mouth daily.    Dispense:  90 tablet    Refill:  0   rosuvastatin (CRESTOR) 20 MG tablet    Sig: Take 1 tablet (20 mg total) by mouth daily.    Dispense:  90 tablet    Refill:  1   celecoxib (CELEBREX) 200 MG capsule    Sig: Take 1 capsule (200 mg total) by mouth daily as needed for mild pain (pain score 1-3) or moderate pain (pain score 4-6).    Dispense:  30 capsule    Refill:  0    Follow-up: Return in about 4 months (around 12/18/2023) for DM.    Anabel Halon, MD

## 2023-08-20 NOTE — Patient Instructions (Signed)
Please continue to take medications as prescribed.  Please continue to follow low carb diet and perform moderate exercise/walking at least 150 mins/week.  Please contact us before refilling Mounjaro.

## 2023-08-20 NOTE — Assessment & Plan Note (Signed)
Inability to flex left hand middle finger -likely due to trigger finger Referred to orthopedic surgery

## 2023-08-20 NOTE — Assessment & Plan Note (Signed)
Foot pain improved with gabapentin 200 mg qHS

## 2023-08-21 NOTE — Telephone Encounter (Signed)
Copied from CRM [id number]. Topic: Clinical - Lab/Test Results >> Aug 21, 2023 12:58 PM Victorino Dike T wrote: Reason for CRM: have not heard back about A1C results, please call patient [phone]

## 2023-08-22 LAB — BAYER DCA HB A1C WAIVED: HB A1C (BAYER DCA - WAIVED): 5.4 % (ref 4.8–5.6)

## 2023-08-31 ENCOUNTER — Ambulatory Visit: Payer: 59 | Admitting: Orthopedic Surgery

## 2023-08-31 ENCOUNTER — Encounter: Payer: Self-pay | Admitting: Orthopedic Surgery

## 2023-08-31 ENCOUNTER — Other Ambulatory Visit (INDEPENDENT_AMBULATORY_CARE_PROVIDER_SITE_OTHER): Payer: 59

## 2023-08-31 VITALS — BP 171/88 | HR 82 | Ht 61.0 in | Wt 173.0 lb

## 2023-08-31 DIAGNOSIS — M25551 Pain in right hip: Secondary | ICD-10-CM

## 2023-08-31 DIAGNOSIS — M47816 Spondylosis without myelopathy or radiculopathy, lumbar region: Secondary | ICD-10-CM

## 2023-08-31 DIAGNOSIS — M25552 Pain in left hip: Secondary | ICD-10-CM | POA: Diagnosis not present

## 2023-08-31 DIAGNOSIS — M545 Low back pain, unspecified: Secondary | ICD-10-CM

## 2023-08-31 NOTE — Patient Instructions (Signed)
Physical therapy has been ordered for you at [facility]. They should call you to schedule, [phone] is the phone number to call, if you want to call to schedule.
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
## 2023-08-31 NOTE — Progress Notes (Signed)
  Intake history:  There were no vitals taken for this visit. There is no height or weight on file to calculate BMI.    WHAT ARE WE SEEING YOU FOR TODAY?   bilateral hip(s)  How long has this bothered you? (DOI?DOS?WS?)  10 year(s) ago  Anticoag.  Yes  Diabetes Yes  Heart disease Yes  Hypertension Yes  SMOKING HX Yes  Kidney disease No  Any ALLERGIES ______________________________________________   Treatment:  Have you taken:  Tylenol Yes  Advil No  Had PT No  Had injection No  Other  _________________________

## 2023-09-08 DIAGNOSIS — H40013 Open angle with borderline findings, low risk, bilateral: Secondary | ICD-10-CM | POA: Diagnosis not present
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
## 2023-09-13 ENCOUNTER — Encounter: Payer: Self-pay | Admitting: Internal Medicine

## 2023-09-14 ENCOUNTER — Other Ambulatory Visit: Payer: Self-pay | Admitting: Internal Medicine

## 2023-09-14 DIAGNOSIS — E1149 Type 2 diabetes mellitus with other diabetic neurological complication: Secondary | ICD-10-CM

## 2023-09-14 DIAGNOSIS — E1151 Type 2 diabetes mellitus with diabetic peripheral angiopathy without gangrene: Secondary | ICD-10-CM

## 2023-09-14 MED ORDER — TIRZEPATIDE 7.5 MG/0.5ML ~~LOC~~ SOAJ
7.5000 mg | SUBCUTANEOUS | 0 refills | Status: DC
Start: 2023-09-14 — End: 2023-12-18

## 2023-09-17 ENCOUNTER — Ambulatory Visit (HOSPITAL_COMMUNITY): Payer: 59 | Attending: Orthopedic Surgery

## 2023-09-17 ENCOUNTER — Other Ambulatory Visit: Payer: Self-pay

## 2023-09-17 DIAGNOSIS — R29898 Other symptoms and signs involving the musculoskeletal system: Secondary | ICD-10-CM | POA: Diagnosis not present

## 2023-09-17 DIAGNOSIS — M545 Low back pain, unspecified: Secondary | ICD-10-CM | POA: Insufficient documentation

## 2023-09-22 ENCOUNTER — Ambulatory Visit (HOSPITAL_COMMUNITY): Payer: 59 | Admitting: Physical Therapy

## 2023-09-22 DIAGNOSIS — R29898 Other symptoms and signs involving the musculoskeletal system: Secondary | ICD-10-CM

## 2023-09-22 DIAGNOSIS — M545 Low back pain, unspecified: Secondary | ICD-10-CM | POA: Diagnosis not present
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
## 2023-09-28 ENCOUNTER — Encounter (HOSPITAL_COMMUNITY): Payer: 59 | Admitting: Physical Therapy

## 2023-09-30 ENCOUNTER — Encounter (HOSPITAL_COMMUNITY): Payer: 59 | Admitting: Physical Therapy

## 2023-10-06 ENCOUNTER — Ambulatory Visit (HOSPITAL_COMMUNITY): Payer: 59 | Attending: Orthopedic Surgery | Admitting: Physical Therapy

## 2023-10-06 DIAGNOSIS — M545 Low back pain, unspecified: Secondary | ICD-10-CM | POA: Diagnosis not present

## 2023-10-06 DIAGNOSIS — R29898 Other symptoms and signs involving the musculoskeletal system: Secondary | ICD-10-CM | POA: Diagnosis not present
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
## 2023-10-08 ENCOUNTER — Encounter (HOSPITAL_COMMUNITY): Payer: 59 | Admitting: Physical Therapy

## 2023-10-13 ENCOUNTER — Ambulatory Visit (HOSPITAL_COMMUNITY): Payer: 59 | Admitting: Physical Therapy

## 2023-10-13 DIAGNOSIS — R29898 Other symptoms and signs involving the musculoskeletal system: Secondary | ICD-10-CM

## 2023-10-13 DIAGNOSIS — M545 Low back pain, unspecified: Secondary | ICD-10-CM
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
## 2023-10-15 ENCOUNTER — Encounter (HOSPITAL_COMMUNITY): Payer: Self-pay

## 2023-10-15 ENCOUNTER — Ambulatory Visit (HOSPITAL_COMMUNITY): Payer: 59

## 2023-10-15 DIAGNOSIS — R29898 Other symptoms and signs involving the musculoskeletal system: Secondary | ICD-10-CM

## 2023-10-15 DIAGNOSIS — M545 Low back pain, unspecified: Secondary | ICD-10-CM | POA: Diagnosis not present
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
## 2023-10-20 ENCOUNTER — Ambulatory Visit (HOSPITAL_COMMUNITY): Payer: 59

## 2023-10-20 ENCOUNTER — Encounter (HOSPITAL_COMMUNITY): Payer: Self-pay

## 2023-10-20 DIAGNOSIS — M545 Low back pain, unspecified: Secondary | ICD-10-CM | POA: Diagnosis not present

## 2023-10-20 DIAGNOSIS — R29898 Other symptoms and signs involving the musculoskeletal system: Secondary | ICD-10-CM | POA: Diagnosis not present
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
## 2023-10-29 ENCOUNTER — Ambulatory Visit (HOSPITAL_COMMUNITY)

## 2023-10-29 ENCOUNTER — Encounter (HOSPITAL_COMMUNITY): Payer: Self-pay

## 2023-10-29 DIAGNOSIS — M545 Low back pain, unspecified: Secondary | ICD-10-CM | POA: Diagnosis not present

## 2023-10-29 DIAGNOSIS — R29898 Other symptoms and signs involving the musculoskeletal system: Secondary | ICD-10-CM
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
## 2023-11-03 ENCOUNTER — Encounter (HOSPITAL_COMMUNITY)

## 2023-11-05 ENCOUNTER — Ambulatory Visit (HOSPITAL_COMMUNITY): Attending: Orthopedic Surgery | Admitting: Physical Therapy

## 2023-11-05 DIAGNOSIS — M545 Low back pain, unspecified: Secondary | ICD-10-CM | POA: Insufficient documentation

## 2023-11-05 DIAGNOSIS — R29898 Other symptoms and signs involving the musculoskeletal system: Secondary | ICD-10-CM | POA: Insufficient documentation
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
## 2023-11-10 ENCOUNTER — Encounter (HOSPITAL_COMMUNITY): Admitting: Physical Therapy

## 2023-11-12 ENCOUNTER — Ambulatory Visit (HOSPITAL_COMMUNITY): Admitting: Physical Therapy

## 2023-11-12 DIAGNOSIS — R29898 Other symptoms and signs involving the musculoskeletal system: Secondary | ICD-10-CM | POA: Diagnosis not present

## 2023-11-12 DIAGNOSIS — M545 Low back pain, unspecified: Secondary | ICD-10-CM
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
## 2023-11-17 ENCOUNTER — Ambulatory Visit (HOSPITAL_COMMUNITY): Admitting: Physical Therapy

## 2023-11-17 DIAGNOSIS — M545 Low back pain, unspecified: Secondary | ICD-10-CM

## 2023-11-17 DIAGNOSIS — R29898 Other symptoms and signs involving the musculoskeletal system: Secondary | ICD-10-CM
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
## 2023-11-19 ENCOUNTER — Ambulatory Visit (HOSPITAL_COMMUNITY)

## 2023-11-19 ENCOUNTER — Encounter (HOSPITAL_COMMUNITY): Payer: Self-pay

## 2023-11-19 DIAGNOSIS — R29898 Other symptoms and signs involving the musculoskeletal system: Secondary | ICD-10-CM | POA: Diagnosis not present

## 2023-11-19 DIAGNOSIS — M545 Low back pain, unspecified: Secondary | ICD-10-CM
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
## 2023-11-24 ENCOUNTER — Ambulatory Visit (HOSPITAL_COMMUNITY): Admitting: Physical Therapy

## 2023-11-24 DIAGNOSIS — R29898 Other symptoms and signs involving the musculoskeletal system: Secondary | ICD-10-CM

## 2023-11-24 DIAGNOSIS — M545 Low back pain, unspecified: Secondary | ICD-10-CM
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
## 2023-12-08 ENCOUNTER — Other Ambulatory Visit: Payer: Self-pay | Admitting: Internal Medicine

## 2023-12-08 DIAGNOSIS — J449 Chronic obstructive pulmonary disease, unspecified: Secondary | ICD-10-CM

## 2023-12-17 DIAGNOSIS — E782 Mixed hyperlipidemia: Secondary | ICD-10-CM | POA: Diagnosis not present

## 2023-12-17 DIAGNOSIS — E1151 Type 2 diabetes mellitus with diabetic peripheral angiopathy without gangrene: Secondary | ICD-10-CM | POA: Diagnosis not present

## 2023-12-18 ENCOUNTER — Other Ambulatory Visit: Payer: Self-pay | Admitting: Internal Medicine

## 2023-12-18 DIAGNOSIS — E1151 Type 2 diabetes mellitus with diabetic peripheral angiopathy without gangrene: Secondary | ICD-10-CM

## 2023-12-18 DIAGNOSIS — E1149 Type 2 diabetes mellitus with other diabetic neurological complication: Secondary | ICD-10-CM

## 2023-12-18 LAB — CMP14+EGFR
ALT: 16 IU/L (ref 0–32)
AST: 17 IU/L (ref 0–40)
Albumin: 3.9 g/dL (ref 3.9–4.9)
Alkaline Phosphatase: 92 IU/L (ref 44–121)
BUN/Creatinine Ratio: 11 — ABNORMAL LOW (ref 12–28)
BUN: 7 mg/dL — ABNORMAL LOW (ref 8–27)
Bilirubin Total: 0.3 mg/dL (ref 0.0–1.2)
CO2: 21 mmol/L (ref 20–29)
Calcium: 9.2 mg/dL (ref 8.7–10.3)
Chloride: 106 mmol/L (ref 96–106)
Creatinine, Ser: 0.63 mg/dL (ref 0.57–1.00)
Globulin, Total: 2.4 g/dL (ref 1.5–4.5)
Glucose: 110 mg/dL — ABNORMAL HIGH (ref 70–99)
Potassium: 4.1 mmol/L (ref 3.5–5.2)
Sodium: 144 mmol/L (ref 134–144)
Total Protein: 6.3 g/dL (ref 6.0–8.5)
eGFR: 99 mL/min/{1.73_m2} (ref >59)

## 2023-12-18 LAB — LIPID PANEL
Chol/HDL Ratio: 2.6 ratio (ref 0.0–4.4)
Cholesterol, Total: 125 mg/dL (ref 100–199)
HDL: 48 mg/dL (ref >39)
LDL Chol Calc (NIH): 58 mg/dL (ref 0–99)
Triglycerides: 100 mg/dL (ref 0–149)
VLDL Cholesterol Cal: 19 mg/dL (ref 5–40)

## 2023-12-18 LAB — HEMOGLOBIN A1C
Est. average glucose Bld gHb Est-mCnc: 120 mg/dL
Hgb A1c MFr Bld: 5.8 % — ABNORMAL HIGH (ref 4.8–5.6)

## 2023-12-22 ENCOUNTER — Encounter: Payer: Self-pay | Admitting: Internal Medicine

## 2023-12-22 ENCOUNTER — Ambulatory Visit (INDEPENDENT_AMBULATORY_CARE_PROVIDER_SITE_OTHER): Payer: 59 | Admitting: Internal Medicine

## 2023-12-22 VITALS — BP 164/74 | HR 74 | Ht 61.0 in | Wt 166.0 lb

## 2023-12-22 DIAGNOSIS — F17219 Nicotine dependence, cigarettes, with unspecified nicotine-induced disorders: Secondary | ICD-10-CM | POA: Diagnosis not present

## 2023-12-22 DIAGNOSIS — M25511 Pain in right shoulder: Secondary | ICD-10-CM

## 2023-12-22 DIAGNOSIS — E1149 Type 2 diabetes mellitus with other diabetic neurological complication: Secondary | ICD-10-CM

## 2023-12-22 DIAGNOSIS — G8929 Other chronic pain: Secondary | ICD-10-CM

## 2023-12-22 DIAGNOSIS — J449 Chronic obstructive pulmonary disease, unspecified: Secondary | ICD-10-CM | POA: Diagnosis not present

## 2023-12-22 DIAGNOSIS — Z7984 Long term (current) use of oral hypoglycemic drugs: Secondary | ICD-10-CM | POA: Diagnosis not present

## 2023-12-22 DIAGNOSIS — I10 Essential (primary) hypertension: Secondary | ICD-10-CM

## 2023-12-22 DIAGNOSIS — E1151 Type 2 diabetes mellitus with diabetic peripheral angiopathy without gangrene: Secondary | ICD-10-CM | POA: Diagnosis not present

## 2023-12-22 DIAGNOSIS — F4320 Adjustment disorder, unspecified: Secondary | ICD-10-CM

## 2023-12-22 MED ORDER — ALBUTEROL SULFATE HFA 108 (90 BASE) MCG/ACT IN AERS: 2.0000 | INHALATION_SPRAY | Freq: Four times a day (QID) | RESPIRATORY_TRACT | 3 refills | Status: AC | PRN

## 2023-12-22 MED ORDER — TIRZEPATIDE 10 MG/0.5ML ~~LOC~~ SOAJ
10.0000 mg | SUBCUTANEOUS | 1 refills | Status: DC
Start: 2023-12-22 — End: 2024-06-06

## 2023-12-22 MED ORDER — OLMESARTAN MEDOXOMIL 40 MG PO TABS
40.0000 mg | ORAL_TABLET | Freq: Every day | ORAL | 1 refills | Status: DC
Start: 2023-12-22 — End: 2024-03-23

## 2023-12-22 NOTE — Assessment & Plan Note (Signed)
 Well-controlled with Incruse and PRN Albuterol , refilled Needs to remain compliant to maintenance inhaler Has been trying to quit smoking, has tried Chantix , switched to Wellbutrin , but did not help

## 2023-12-22 NOTE — Progress Notes (Signed)
 Established Patient Office Visit  Subjective:  Patient ID: Lauren David, female    DOB: 09-Jan-1961  Age: 63 y.o. MRN: [id number]  CC:  Chief Complaint  Patient presents with   Medical Management of Chronic Issues    4 month f/u    HPI Lauren David is a 63 y.o. female with past medical history of CAD s/p CABG, HTN, COPD, type 2 DM, Osteopenia, chronic left hip pain and tobacco abuse who presents for f/u of her chronic medical conditions.  CAD s/p CABG: She denies any chest pain or dyspnea currently. She is taking Aspirin  and statin currently. She is on Metoprolol  as well.  HTN: BP is still elevated. Patient denies chest pain, dyspnea or palpitations. She takes Metoprolol  12.5 mg BID for CAD. She has started Olmesartan  20 mg once daily now, but has not taken it today.  Type 2 DM: She has been taking Glipizide  5 mg QD, Mounjaro  7.5 mg qw and Jardiance  10 mg QD. Her HbA1c is 5.8 now. Her blood glucose has been between 80-130. Denies any episode of hypoglycemia. She denies any dysuria or hematuria. She has noticed significant improvement in her foot pain with gabapentin . US  ABI was wnl.  GERD: She has noticed heartburn/acid reflux and belching since starting Mounjaro , but is better with Pepcid  now.  She has mild nausea, but denies any vomiting.  Denies any melena or hematochezia.  She has cut down smoking and does not smoke everyday now. She has tried Chantix  and Wellbutrin , but was not able to quit smoking.  She has been stressed due to loss of her brother 10 days ago.  She reports feeling anxious and lack of sleep, but reports that she has good family support.  She reports chronic bilateral hip pain, L > R.  She denies any recent injury or fall.  Pain is constant, dull, worse with walking and better with Celebrex . She was seen by orthopedic surgeon, has noticed improvement with PT.  She still reports chronic right shoulder pain, which is constant, worse with abduction and better with  rest.  She also reports pain of the middle finger of left hand, and inability to bend it completely.  She has noticed pain in the tendon of the hand as well.    Past Medical History:  Diagnosis Date   Anxiety    Asthma    CAD (coronary artery disease)    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   Chronic back pain    COPD (chronic obstructive pulmonary disease) (HCC)    Depression    Essential hypertension    Headache    Hyperlipidemia    Myocardial infarction (HCC)    Pneumonia    Type 2 diabetes mellitus (HCC)     Past Surgical History:  Procedure Laterality Date   BREAST BIOPSY Left 2016   fibroadenoma with calcifications and fibrocystic changes   COLONOSCOPY N/A 04/26/2018   Procedure: COLONOSCOPY;  Surgeon: Ruby Corporal, MD;  Location: [facility];  Service: Endoscopy;  Laterality: N/A;  1:15   CORONARY ARTERY BYPASS GRAFT N/A 05/21/2021   Procedure: CORONARY ARTERY BYPASS GRAFTING (CABG) X4 ON PUMP USING LEFT INTERNAL MAMMARY ARTERY AND ENDOSCOPICALLY HARVESTED RIGHT GREATER SAPHENOUS VEIN;  Surgeon: Hilarie Lovely, MD;  Location: [facility];  Service: Open Heart Surgery;  Laterality: N/A;   CORONARY PRESSURE/FFR STUDY N/A 05/12/2018   Procedure: INTRAVASCULAR PRESSURE WIRE/FFR STUDY;  Surgeon: Sammy Crisp, MD;  Location: [facility];  Service:  Cardiovascular;  Laterality: N/A;   CORONARY PRESSURE/FFR STUDY N/A 04/22/2021   Procedure: INTRAVASCULAR PRESSURE WIRE/FFR STUDY;  Surgeon: Sammy Crisp, MD;  Location: [facility];  Service: Cardiovascular;  Laterality: N/A;   ENDOVEIN HARVEST OF GREATER SAPHENOUS VEIN Right 05/21/2021   Procedure: ENDOVEIN HARVEST OF GREATER SAPHENOUS VEIN;  Surgeon: Hilarie Lovely, MD;  Location: [facility];  Service: Open Heart Surgery;  Laterality: Right;   LEFT HEART CATH AND CORONARY ANGIOGRAPHY N/A 05/12/2018   Procedure: LEFT HEART CATH AND CORONARY ANGIOGRAPHY;  Surgeon: Sammy Crisp, MD;   Location: [facility];  Service: Cardiovascular;  Laterality: N/A;   LEFT HEART CATH AND CORONARY ANGIOGRAPHY N/A 04/22/2021   Procedure: LEFT HEART CATH AND CORONARY ANGIOGRAPHY;  Surgeon: Sammy Crisp, MD;  Location: [facility];  Service: Cardiovascular;  Laterality: N/A;   POLYPECTOMY  04/26/2018   Procedure: POLYPECTOMY;  Surgeon: Ruby Corporal, MD;  Location: [facility];  Service: Endoscopy;;  colon    RADIAL ARTERY HARVEST Left 05/21/2021   Procedure: RADIAL ARTERY HARVEST;  Surgeon: Hilarie Lovely, MD;  Location: [facility];  Service: Open Heart Surgery;  Laterality: Left;   TEE WITHOUT CARDIOVERSION N/A 05/21/2021   Procedure: TRANSESOPHAGEAL ECHOCARDIOGRAM (TEE);  Surgeon: Hilarie Lovely, MD;  Location: [facility] OR;  Service: Open Heart Surgery;  Laterality: N/A;   TOTAL VAGINAL HYSTERECTOMY      Family History  Problem Relation Age of Onset   Stroke Father    Hypertension Father    Breast cancer Paternal Aunt    Breast cancer Paternal Aunt    Breast cancer Paternal Grandmother     Social History   Socioeconomic History   Marital status: Married    Spouse name: Not on file   Number of children: Not on file   Years of education: Not on file   Highest education level: Not on file  Occupational History   Not on file  Tobacco Use   Smoking status: Every Day    Current packs/day: 1.00    Average packs/day: 1 pack/day for 40.0 years (40.0 ttl pk-yrs)    Types: Cigarettes    Passive exposure: Never   Smokeless tobacco: Never   Tobacco comments:    cut back to 1/2 pack a day. Smokig x 25yrs. Did smoke 2 packs x 5 yrs, but cut back a month ago.   Vaping Use   Vaping status: Never Used  Substance and Sexual Activity   Alcohol use: Never   Drug use: Never   Sexual activity: Not on file  Other Topics Concern   Not on file  Social History Narrative   Not on file   Social Drivers of Health   Financial Resource Strain: Low Risk  (04/09/2023)    Overall Financial Resource Strain (CARDIA)    Difficulty of Paying Living Expenses: Not hard at all  Food Insecurity: No Food Insecurity (04/09/2023)   Hunger Vital Sign    Worried About Running Out of Food in the Last Year: Never true    Ran Out of Food in the Last Year: Never true  Transportation Needs: No Transportation Needs (04/09/2023)   PRAPARE - Administrator, Civil Service (Medical): No    Lack of Transportation (Non-Medical): No  Physical Activity: Sufficiently Active (04/09/2023)   Exercise Vital Sign    Days of Exercise per Week: 7 days    Minutes of Exercise per Session: 30 min  Stress: Stress Concern Present (04/09/2023)  [employer] of Occupational Health - Occupational Stress Questionnaire    Feeling of Stress : To some extent  Social Connections: Moderately Isolated (04/09/2023)   Social Connection and Isolation Panel [NHANES]    Frequency of Communication with Friends and Family: More than three times a week    Frequency of Social Gatherings with Friends and Family: More than three times a week    Attends Religious Services: More than 4 times per year    Active Member of [employer] or Organizations: No    Attends Banker Meetings: Never    Marital Status: Separated  Intimate Partner Violence: Not At Risk (04/09/2023)   Humiliation, Afraid, Rape, and Kick questionnaire    Fear of Current or Ex-Partner: No    Emotionally Abused: No    Physically Abused: No    Sexually Abused: No    Outpatient Medications Prior to Visit  Medication Sig Dispense Refill   acetaminophen  (TYLENOL ) 500 MG tablet Take 1,000 mg by mouth every 6 (six) hours as needed for moderate pain or headache.     albuterol  (PROVENTIL ) (2.5 MG/3ML) 0.083% nebulizer solution ONE VIAL BY NEBULIZATION EVERY 6 HOURS AS NEEDED FOR WHEEZING OR SHORTNESS OF BREATH. (Patient taking differently: Take 2.5 mg by nebulization every 6 (six) hours as needed for wheezing.) 180 mL 0   aspirin  EC 81 MG  tablet Take 81 mg by mouth daily. Swallow whole.     blood glucose meter kit and supplies Dispense based on patient and insurance preference. Use up to four times daily as directed. (FOR ICD-10 E10.9, E11.9). 1 each 0   celecoxib  (CELEBREX ) 200 MG capsule Take 1 capsule (200 mg total) by mouth daily as needed for mild pain (pain score 1-3) or moderate pain (pain score 4-6). 30 capsule 0   empagliflozin  (JARDIANCE ) 10 MG TABS tablet Take 1 tablet (10 mg total) by mouth daily. 90 tablet 1   famotidine  (PEPCID ) 40 MG tablet Take 1 tablet (40 mg total) by mouth daily. 90 tablet 3   gabapentin  (NEURONTIN ) 100 MG capsule Take 2 capsules (200 mg total) by mouth at bedtime. 180 capsule 1   ketoconazole  (NIZORAL ) 2 % cream Apply 1 Application topically daily. 15 g 0   metoprolol  tartrate (LOPRESSOR ) 25 MG tablet Take 0.5 tablets (12.5 mg total) by mouth 2 (two) times daily. 90 tablet 1   rosuvastatin  (CRESTOR ) 20 MG tablet Take 1 tablet (20 mg total) by mouth daily. 90 tablet 1   umeclidinium bromide  (INCRUSE ELLIPTA ) 62.5 MCG/ACT AEPB Inhale 1 puff into the lungs daily. 30 each 5   Vitamin D , Ergocalciferol , 50000 units CAPS TAKE 1 CAPSULE BY MOUTH EVERY 7 DAYS. 12 capsule 0   albuterol  (VENTOLIN  HFA) 108 (90 Base) MCG/ACT inhaler INHALE 2 PUFFS BY MOUTH EVERY 4 HOURS AS NEEDED FOR WHEEZING OR SHORTNESS OF BREATH 18 g 0   glipiZIDE  (GLUCOTROL ) 5 MG tablet Take 1 tablet (5 mg total) by mouth 2 (two) times daily before a meal. 180 tablet 1   olmesartan  (BENICAR ) 20 MG tablet Take 1 tablet (20 mg total) by mouth daily. 90 tablet 0   tirzepatide  (MOUNJARO ) 7.5 MG/0.5ML Pen INJECT 7.5 MG INTO THE SKIN ONCE A WEEK 6 mL 0   nitroGLYCERIN  (NITROSTAT ) 0.4 MG SL tablet Place 1 tablet (0.4 mg total) under the tongue every 5 (five) minutes x 3 doses as needed for chest pain (If no relief after 3rd dose, GO TO ED). 30 tablet 1   No facility-administered medications  prior to visit.    Allergies  Allergen Reactions    Percocet [Oxycodone-Acetaminophen ] Rash    ROS Review of Systems  Constitutional:  Negative for chills and fever.  HENT:  Negative for congestion, postnasal drip, sinus pressure and sinus pain.   Eyes:  Negative for pain and discharge.  Respiratory:  Negative for cough, shortness of breath and wheezing.   Cardiovascular:  Negative for chest pain and palpitations.  Gastrointestinal:  Negative for abdominal pain, diarrhea, nausea and vomiting.  Endocrine: Negative for polydipsia and polyuria.  Genitourinary:  Negative for dysuria and hematuria.  Musculoskeletal:  Positive for arthralgias (B/l hip, right shoulder) and back pain. Negative for neck pain and neck stiffness.  Skin:  Negative for rash.  Neurological:  Negative for dizziness and weakness.  Psychiatric/Behavioral:  Positive for sleep disturbance. Negative for agitation and behavioral problems.       Objective:    Physical Exam Vitals reviewed.  Constitutional:      General: She is not in acute distress.    Appearance: She is obese. She is not diaphoretic.  HENT:     Head: Normocephalic and atraumatic.     Mouth/Throat:     Mouth: Mucous membranes are moist.  Eyes:     General: No scleral icterus.    Extraocular Movements: Extraocular movements intact.  Cardiovascular:     Rate and Rhythm: Normal rate and regular rhythm.     Heart sounds: Normal heart sounds. No murmur heard. Pulmonary:     Breath sounds: Normal breath sounds. No wheezing or rales.  Musculoskeletal:     Right shoulder: Tenderness present. Decreased range of motion.     Cervical back: Neck supple. No tenderness.     Right hip: No tenderness. Normal range of motion.     Left hip: No tenderness. Normal range of motion.     Right lower leg: No edema.     Left lower leg: No edema.     Comments: Trigger finger of left hand middle finger  Skin:    General: Skin is warm.     Findings: No rash.  Neurological:     General: No focal deficit present.      Mental Status: She is alert and oriented to person, place, and time.     Sensory: No sensory deficit.     Motor: No weakness.  Psychiatric:        Mood and Affect: Mood normal.        Behavior: Behavior normal.     BP (!) 161/72 (BP Location: Left Arm)   Pulse 74   Ht 5\' 1"  (1.549 m)   Wt 166 lb (75.3 kg)   SpO2 96%   BMI 31.37 kg/m  Wt Readings from Last 3 Encounters:  12/22/23 166 lb (75.3 kg)  08/31/23 173 lb (78.5 kg)  08/20/23 168 lb 9.6 oz (76.5 kg)    Lab Results  Component Value Date   TSH 1.380 01/24/2021   Lab Results  Component Value Date   WBC 10.9 (H) 05/28/2022   HGB 13.2 05/28/2022   HCT 42.3 05/28/2022   MCV 89 05/28/2022   PLT 252 05/28/2022   Lab Results  Component Value Date   NA 144 12/17/2023   K 4.1 12/17/2023   CO2 21 12/17/2023   GLUCOSE 110 (H) 12/17/2023   BUN 7 (L) 12/17/2023   CREATININE 0.63 12/17/2023   BILITOT 0.3 12/17/2023   ALKPHOS 92 12/17/2023   AST 17 12/17/2023   ALT  16 12/17/2023   PROT 6.3 12/17/2023   ALBUMIN  3.9 12/17/2023   CALCIUM  9.2 12/17/2023   ANIONGAP 9 05/24/2021   EGFR 99 12/17/2023   Lab Results  Component Value Date   CHOL 125 12/17/2023   Lab Results  Component Value Date   HDL 48 12/17/2023   Lab Results  Component Value Date   LDLCALC 58 12/17/2023   Lab Results  Component Value Date   TRIG 100 12/17/2023   Lab Results  Component Value Date   CHOLHDL 2.6 12/17/2023   Lab Results  Component Value Date   HGBA1C 5.8 (H) 12/17/2023      Assessment & Plan:   Problem List Items Addressed This Visit    Problem List Items Addressed This Visit       Cardiovascular and Mediastinum   Primary hypertension   BP Readings from Last 1 Encounters:  12/22/23 (!) 161/72   Uncontrolled On metoprolol  12.5 mg BID and olmesartan  20 mg once daily Increased dose of Olmesartan  40 mg once daily Counseled for compliance with the medications Advised DASH diet and walking as tolerated       Relevant Medications   olmesartan  (BENICAR ) 40 MG tablet   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) - Primary   Lab Results  Component Value Date   HGBA1C 5.8 (H) 12/17/2023   Well-controlled On Mounjaro  7.5 mg qw, Glipizide  5 mg QD and Jardiance  10 mg daily  Increased dose of Mounjaro  to 10 mg qw, DC glipizide  Does not tolerate Metformin , 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 - on Gabapentin  Advised to follow diabetic diet F/u CMP and lipid panel Diabetic eye exam: Advised to follow up with Ophthalmology for diabetic eye exam      Relevant Medications   tirzepatide  (MOUNJARO ) 10 MG/0.5ML Pen   olmesartan  (BENICAR ) 40 MG tablet     Respiratory   Chronic obstructive pulmonary disease (HCC)   Well-controlled with Incruse and PRN Albuterol , refilled Needs to remain compliant to maintenance inhaler Has been trying to quit smoking, has tried Chantix , switched to Wellbutrin , but did not help      Relevant Medications   albuterol  (VENTOLIN  HFA) 108 (90 Base) MCG/ACT inhaler     Endocrine   Type 2 diabetes mellitus with neurological complications (HCC)   Foot pain improved with gabapentin  200 mg qHS      Relevant Medications   tirzepatide  (MOUNJARO ) 10 MG/0.5ML Pen   olmesartan  (BENICAR ) 40 MG tablet     Nervous and Auditory   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 Assess willingness: Unwilling to quit at this time, but is working on cutting back. Assist with counseling and pharmacotherapy: Counseled for 5 minutes and literature provided. Arrange for follow up: follow up in 3 months and continue to offer help.        Other   Chronic right shoulder pain   Unclear etiology, could be rotator cuff tendinopathy versus  OA of shoulder Celebrex  as needed for pain, avoid oral and NSAIDs for long term due to h/o CAD Tylenol  arthritis as needed for pain Referred to orthopedic surgeon      Relevant Orders   Ambulatory referral to Orthopedic Surgery   Grief reaction   Lost her brother 10 days ago Has anxiety and insomnia, reassured it being a  normal grief response She has adequate family support - lives with 2 grandkids, who are supportive         Meds ordered this encounter  Medications   tirzepatide  (MOUNJARO ) 10 MG/0.5ML Pen    Sig: Inject 10 mg into the skin once a week.    Dispense:  6 mL    Refill:  1   albuterol  (VENTOLIN  HFA) 108 (90 Base) MCG/ACT inhaler    Sig: Inhale 2 puffs into the lungs every 6 (six) hours as needed for wheezing or shortness of breath.    Dispense:  18 g    Refill:  3   olmesartan  (BENICAR ) 40 MG tablet    Sig: Take 1 tablet (40 mg total) by mouth daily.    Dispense:  90 tablet    Refill:  1    Follow-up: Return in about 3 months (around 03/23/2024) for HTN and DM.    Meldon Sport, MD

## 2023-12-22 NOTE — Patient Instructions (Addendum)
 Please start taking Mounjaro  10 mg once weekly.  Please stop taking Glipizide .  Please start taking Olmesartan  40 mg once daily instead of 20 mg.  Please continue to take medications as prescribed.  Please continue to follow low carb diet and perform moderate exercise/walking at least 150 mins/week.  Please get fasting blood tests done before the next visit.

## 2023-12-22 NOTE — Assessment & Plan Note (Signed)
 Unclear etiology, could be rotator cuff tendinopathy versus OA of shoulder Celebrex  as needed for pain, avoid oral and NSAIDs for long term due to h/o CAD Tylenol  arthritis as needed for pain Referred to orthopedic surgeon

## 2023-12-22 NOTE — Assessment & Plan Note (Signed)
Smokes about 0.5 pack/day  Asked about quitting: confirms that he/she currently smokes cigarettes Advise to quit smoking: Educated about QUITTING to reduce the risk of cancer, cardio and cerebrovascular disease. Assess willingness: Unwilling to quit at this time, but is working on cutting back. Assist with counseling and pharmacotherapy: Counseled for 5 minutes and literature provided. Arrange for follow up: follow up in 3 months and continue to offer help. 

## 2023-12-22 NOTE — Assessment & Plan Note (Signed)
Foot pain improved with gabapentin 200 mg qHS

## 2023-12-22 NOTE — Assessment & Plan Note (Signed)
 BP Readings from Last 1 Encounters:  12/22/23 (!) 161/72   Uncontrolled On metoprolol  12.5 mg BID and olmesartan  20 mg once daily Increased dose of Olmesartan  40 mg once daily Counseled for compliance with the medications Advised DASH diet and walking as tolerated

## 2023-12-22 NOTE — Assessment & Plan Note (Signed)
 Lost her brother 10 days ago Has anxiety and insomnia, reassured it being a normal grief response She has adequate family support - lives with 2 grandkids, who are supportive

## 2023-12-22 NOTE — Assessment & Plan Note (Addendum)
 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

## 2024-01-28 ENCOUNTER — Telehealth: Payer: Self-pay | Admitting: Internal Medicine

## 2024-01-28 NOTE — Telephone Encounter (Signed)
 Left detailed message for patient to call back to schedule Diabetic Eye Exam if interested or if she already sees an eye doctor, we need to know who and where that is.

## 2024-02-15 ENCOUNTER — Encounter: Admitting: Orthopedic Surgery

## 2024-02-18 ENCOUNTER — Ambulatory Visit: Attending: Nurse Practitioner | Admitting: Nurse Practitioner

## 2024-02-18 ENCOUNTER — Encounter: Payer: Self-pay | Admitting: Nurse Practitioner

## 2024-02-18 VITALS — BP 138/72 | HR 83 | Ht 61.0 in | Wt 158.6 lb

## 2024-02-18 DIAGNOSIS — J449 Chronic obstructive pulmonary disease, unspecified: Secondary | ICD-10-CM | POA: Diagnosis not present

## 2024-02-18 DIAGNOSIS — I10 Essential (primary) hypertension: Secondary | ICD-10-CM

## 2024-02-18 DIAGNOSIS — Z79899 Other long term (current) drug therapy: Secondary | ICD-10-CM | POA: Diagnosis not present

## 2024-02-18 DIAGNOSIS — E782 Mixed hyperlipidemia: Secondary | ICD-10-CM

## 2024-02-18 DIAGNOSIS — I2510 Atherosclerotic heart disease of native coronary artery without angina pectoris: Secondary | ICD-10-CM | POA: Diagnosis not present

## 2024-02-18 DIAGNOSIS — R9431 Abnormal electrocardiogram [ECG] [EKG]: Secondary | ICD-10-CM

## 2024-02-18 DIAGNOSIS — R0989 Other specified symptoms and signs involving the circulatory and respiratory systems: Secondary | ICD-10-CM | POA: Diagnosis not present

## 2024-02-18 DIAGNOSIS — Z720 Tobacco use: Secondary | ICD-10-CM

## 2024-02-18 DIAGNOSIS — E785 Hyperlipidemia, unspecified: Secondary | ICD-10-CM | POA: Diagnosis not present

## 2024-02-18 MED ORDER — ROSUVASTATIN CALCIUM 40 MG PO TABS: 40.0000 mg | ORAL_TABLET | Freq: Every day | ORAL | 1 refills | Status: AC

## 2024-02-18 NOTE — Patient Instructions (Addendum)
 Medication Instructions:  Your physician has recommended you make the following change in your medication:  Please Increase Crestor  to 40 Mg daily   Labwork: In 1 month at [facility]   Testing/Procedures: Your physician has requested that you have an echocardiogram. Echocardiography is a painless test that uses sound waves to create images of your heart. It provides your doctor with information about the size and shape of your heart and how well your heart's chambers and valves are working. This procedure takes approximately one hour. There are no restrictions for this procedure. Please do NOT wear cologne, perfume, aftershave, or lotions (deodorant is allowed). Please arrive 15 minutes prior to your appointment time.  Please note: We ask at that you not bring children with you during ultrasound (echo/ vascular) testing. Due to room size and safety concerns, children are not allowed in the ultrasound rooms during exams. Our front office staff cannot provide observation of children in our lobby area while testing is being conducted. An adult accompanying a patient to their appointment will only be allowed in the ultrasound room at the discretion of the ultrasound technician under special circumstances. We apologize for any inconvenience. Your physician has requested that you have a carotid duplex. This test is an ultrasound of the carotid arteries in your neck. It looks at blood flow through these arteries that supply the brain with blood. Allow one hour for this exam. There are no restrictions or special instructions.  Follow-Up: Your physician recommends that you schedule a follow-up appointment in: 6-8 weeks   Any Other Special Instructions Will Be Listed Below (If Applicable).  If you need a refill on your cardiac medications before your next appointment, please call your pharmacy.

## 2024-02-18 NOTE — Progress Notes (Unsigned)
 Office Visit    Patient Name: Lauren David Date of Encounter: 02/18/2024 PCP:  Tobie Suzzane POUR, MD [facility]  Cardiologist:  Jayson Sierras, MD  Advanced Practice Provider:  No care team member to display Electrophysiologist:  None   Chief Complaint and HPI    Lauren David is a 63 y.o. female with a hx of CAD, s/p CABG in 2022, hypertension, hyperlipidemia, type 2 diabetes, COPD, anxiety, and asthma, who presents today for 1 year follow-up.  Last seen by Dr. Sierras on March 24, 2022.  Was doing well at the time.  11/11/2022 - Today she presents for 12-month follow-up.  She states she is doing well. Did have Covid-19 07/2022, recovered well. Denies any chest pain, shortness of breath, palpitations, syncope, presyncope, dizziness, orthopnea, PND, swelling or significant weight changes, acute bleeding, or claudication.  02/18/2024 - Here for follow-up.  Doing well overall.  She does tell me that her PCP recently had increased her olmesartan  to 40 mg daily in addition to increasing her Mounjaro  at the same time, experienced dizziness after this.  She self reduced her olmesartan  down to 20 mg daily to help with the dizziness that has helped.  She smokes around 1 pack/day, contemplating about quitting smoking. Denies any chest pain, shortness of breath, palpitations, syncope, presyncope, dizziness, orthopnea, PND, swelling or significant weight changes, acute bleeding, or claudication.  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   EKGs/Labs/Other Studies Reviewed:   The following studies were reviewed today:  EKG:  EKG Interpretation Date/Time:  Thursday February 18 2024 10:36:47 EDT Ventricular Rate:  83 PR Interval:  156 QRS Duration:  90 QT Interval:  342 QTC Calculation: 401 R Axis:   91  Text Interpretation: Normal sinus rhythm Rightward axis ST & T wave abnormality, consider inferolateral ischemia When compared with ECG of  22-May-2021 06:38, Premature ventricular complexes are no longer Present T wave inversion now evident in Inferior leads Inverted T waves have replaced nonspecific T wave abnormality in Lateral leads QT has shortened Confirmed by Miriam Norris [phone]) on 02/18/2024 10:57:27 AM    TEE 05/2021: Complications: No known complications during this procedure.  POST-OP IMPRESSIONS  Limited Post-CPB exam: The patient separated easily from CPB.  _ Left Ventricle: The left ventricular function is unchanged from  pre-bypass  images. Contractility and wall motion remain normal. Overall EF appears  >65%.  _ Right Ventricle: The right ventricular function appears normal,  unchanged from  pre-bypass images.  _ Aortic Valve: The aortic valve function appears unchanged from  pre-bypass  images.  _ Mitral Valve: The mitral valve function appears essentially unchanged  from  pre-bypass images. MR is trivial.  _ Tricuspid Valve: The tricuspid valve function appears unchanged from  pre-bypass images.   Echo 04/2021: 1. Left ventricular ejection fraction, by estimation, is 60 to 65%. The  left ventricle has normal function. The left ventricle has no regional  wall motion abnormalities. Left ventricular diastolic parameters are  consistent with Grade I diastolic  dysfunction (impaired relaxation).   2. Right ventricular systolic function is normal. The right ventricular  size is normal.   3. The mitral valve is normal in structure. No evidence of mitral valve  regurgitation. No evidence of mitral stenosis.   4. The aortic valve is tricuspid. Aortic valve regurgitation is not  visualized. No aortic stenosis is present.   5. The inferior vena cava is normal in size with greater than 50%  respiratory variability, suggesting right atrial pressure of 3 mmHg.  LHC 04/2021:  Conclusion: Significant three-vessel CAD that has progressed since 2019, inlcluding long segment of mid LAD disease of up to 75% (RFR =  0.83), 60% proximal LCx stenosis followed by sequential 30% and 90% OM1 lesions, and 70% mid RCA disease. Normal left ventricular systolic function and filling pressure.   Recommendations: Outpatient cardiac surgery consultation for CABG. Aggressive secondary prevention. Obtain TTE prior to cardiac surgery consultation.  Myoview  04/2021:    Findings are equivocal. The study is low risk.   No ST deviation was noted.   LV perfusion is abnormal. Defect 1: There is a medium defect with mild reduction in uptake present in the apical to basal anterior location(s) that is partially reversible. There is normal wall motion in the defect area. Mild anterior ischemia versus variable soft tissue attenuation artifact.  There is significant breast attenuation at baseline.   Left ventricular function is normal. End diastolic cavity size is normal. End systolic cavity size is normal.   Overall equivocal findings with partially reversible, apical to basal anterior defect in the setting of significant breast attenuation artifact at baseline.  Cannot exclude mild anterior ischemia although variable soft tissue attenuation could also be responsible.  LVEF is normal.  Recent Labs: 12/17/2023: ALT 16; BUN 7; Creatinine, Ser 0.63; Potassium 4.1; Sodium 144  Recent Lipid Panel    Component Value Date/Time   CHOL 125 12/17/2023 0908   TRIG 100 12/17/2023 0908   HDL 48 12/17/2023 0908   CHOLHDL 2.6 12/17/2023 0908   CHOLHDL 3.9 05/22/2021 0308   VLDL 14 05/22/2021 0308   LDLCALC 58 12/17/2023 0908     Review of Systems    All other systems reviewed and are otherwise negative except as noted above.  Physical Exam    VS:  BP 138/72   Pulse 83   Ht 5' 1 (1.549 m)   Wt 158 lb 9.6 oz (71.9 kg)   SpO2 98%   BMI 29.97 kg/m  , BMI Body mass index is 29.97 kg/m.  Wt Readings from Last 3 Encounters:  02/18/24 158 lb 9.6 oz (71.9 kg)  12/22/23 166 lb (75.3 kg)  08/31/23 173 lb (78.5 kg)     GEN: Well  developed, well nourished, 63 y.o. female in no acute distress. HEENT: normal. Neck: Supple, no JVD, left carotid bruit, no right carotid bruit, no masses. Cardiac: S1/S2, RRR, no murmurs, rubs, or gallops. No clubbing, cyanosis, edema.  Radials/PT 2+ and equal bilaterally.  Respiratory:  Respirations regular and unlabored, clear to auscultation bilaterally, with inspiratory wheezing noted to right upper lobe posteriorly, otherwise unremarkable MS: No deformity or atrophy. Skin: Warm and dry, no rash. Neuro:  Strength and sensation are intact. Psych: Normal affect.  Assessment & Plan    CAD, s/p CABG in 2022, EKG changes/abnormal EKG Stable with no anginal symptoms. No indication for ischemic evaluation.  EKG today revealed normal sinus rhythm with ST/T wave abnormality and what to have T wave inversion along inferior leads-compared to previous EKG that did not reveal these findings.  Will obtain echocardiogram.  Did discuss ischemic evaluation-patient declines at this time.  Patient is agreeable to have the following labs obtained: CMET, magnesium , CBC. Care and ED precautions dicussed. Continue ASA, Metoprolol  tartrate, and will increase Crestor  to 40 mg daily.  Will obtain FLP in 1 month.  Heart healthy diet and regular cardiovascular exercise encouraged.   Hypertension BP stable. Discussed to monitor BP at  home at least 2 hours after medications and sitting for 5-10 minutes. No medication changes at this time besides what is noted above. Heart healthy diet and regular cardiovascular exercise encouraged.   Hyperlipidemia, medication management, left carotid bruit LDL 58 12/2023. LDL goal < 55. Increasing rosuvastatin  and will recheck FLP in 1 month. Heart healthy diet and regular cardiovascular exercise encouraged.  Will obtain carotid duplex in addition to echocardiogram as noted above.  COPD . Denies any recent issues. No medication changes at this time. Continue to follow with PCP.  5.  Tobacco abuse Smoking cessation encouraged and discussed.   Disposition: Follow up in 6-8 weeks with Jayson Sierras, MD or APP.  Signed, Almarie Crate, NP

## 2024-03-03 ENCOUNTER — Encounter: Admitting: Orthopedic Surgery

## 2024-03-09 DIAGNOSIS — H40051 Ocular hypertension, right eye: Secondary | ICD-10-CM | POA: Diagnosis not present

## 2024-03-09 LAB — HM DIABETES EYE EXAM

## 2024-03-10 ENCOUNTER — Ambulatory Visit: Admitting: Orthopedic Surgery

## 2024-03-10 ENCOUNTER — Other Ambulatory Visit (INDEPENDENT_AMBULATORY_CARE_PROVIDER_SITE_OTHER): Payer: Self-pay

## 2024-03-10 VITALS — BP 142/91 | HR 73 | Ht 61.0 in | Wt 154.0 lb

## 2024-03-10 DIAGNOSIS — M7541 Impingement syndrome of right shoulder: Secondary | ICD-10-CM | POA: Diagnosis not present

## 2024-03-10 DIAGNOSIS — G8929 Other chronic pain: Secondary | ICD-10-CM | POA: Diagnosis not present

## 2024-03-10 DIAGNOSIS — M25511 Pain in right shoulder: Secondary | ICD-10-CM | POA: Diagnosis not present

## 2024-03-10 MED ORDER — METHYLPREDNISOLONE ACETATE 40 MG/ML IJ SUSP
40.0000 mg | Freq: Once | INTRAMUSCULAR | Status: AC
  Administered 2024-03-10: 40 mg via INTRA_ARTICULAR

## 2024-03-10 NOTE — Progress Notes (Signed)
 Patient ID: ZOE CREASMAN, female   DOB: 03-10-1961, 63 y.o.   MRN: [id number]  SUMMARY AND PLAN:  63 year old female has impingement syndrome right shoulder recommend subacromial injection and [place] home exercises    Chief Complaint  Patient presents with   Shoulder Pain    Lauren      HPI SOLITA David is a 63 y.o. female.  Patient presents with right shoulder pain no trauma.  Symptoms present for greater than 1 month.  No injuries.  Pain sometimes at night.  Pain started after bypass graft for her heart.  Night pain yes  Treatment:  Nsaids no PT no Injection no  Current Outpatient Medications  Medication Instructions   acetaminophen  (TYLENOL ) 1,000 mg, Every 6 hours PRN   albuterol  (PROVENTIL ) (2.5 MG/3ML) 0.083% nebulizer solution ONE VIAL BY NEBULIZATION EVERY 6 HOURS AS NEEDED FOR WHEEZING OR SHORTNESS OF BREATH.   albuterol  (VENTOLIN  HFA) 108 (90 Base) MCG/ACT inhaler 2 puffs, Inhalation, Every 6 hours PRN   aspirin  EC 81 mg, Daily   blood glucose meter kit and supplies Dispense based on patient and insurance preference. Use up to four times daily as directed. (FOR ICD-10 E10.9, E11.9).   celecoxib  (CELEBREX ) 200 mg, Oral, Daily PRN   empagliflozin  (JARDIANCE ) 10 mg, Oral, Daily   famotidine  (PEPCID ) 40 mg, Oral, Daily   gabapentin  (NEURONTIN ) 200 mg, Oral, Daily at bedtime   ketoconazole  (NIZORAL ) 2 % cream 1 Application, Topical, Daily   metoprolol  tartrate (LOPRESSOR ) 12.5 mg, Oral, 2 times daily   nitroGLYCERIN  (NITROSTAT ) 0.4 mg, Sublingual, Every 5 min x3 PRN   olmesartan  (BENICAR ) 40 mg, Oral, Daily   rosuvastatin  (CRESTOR ) 40 mg, Oral, Daily   tirzepatide  (MOUNJARO ) 10 mg, Subcutaneous, Weekly   umeclidinium bromide  (INCRUSE ELLIPTA ) 62.5 MCG/ACT AEPB 1 puff, Inhalation, Daily   Vitamin D , Ergocalciferol , 50000 units CAPS 1 capsule, Oral, Every 7 days    Allergies  Allergen Reactions   Percocet [Oxycodone-Acetaminophen ] Rash    Review of  Systems ROS numbness and tingling denied  Past Medical History:  Diagnosis Date   Anxiety    Asthma    CAD (coronary artery disease)    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   Chronic back pain    COPD (chronic obstructive pulmonary disease) (HCC)    Depression    Essential hypertension    Headache    Hyperlipidemia    Myocardial infarction (HCC)    Pneumonia    Type 2 diabetes mellitus (HCC)     Past Surgical History:  Procedure Laterality Date   BREAST BIOPSY Left 2016   fibroadenoma with calcifications and fibrocystic changes   COLONOSCOPY N/A 04/26/2018   Procedure: COLONOSCOPY;  Surgeon: Golda Claudis PENNER, MD;  Location: [facility];  Service: Endoscopy;  Laterality: N/A;  1:15   CORONARY ARTERY BYPASS GRAFT N/A 05/21/2021   Procedure: CORONARY ARTERY BYPASS GRAFTING (CABG) X4 ON PUMP USING LEFT INTERNAL MAMMARY ARTERY AND ENDOSCOPICALLY HARVESTED RIGHT GREATER SAPHENOUS VEIN;  Surgeon: Shyrl Linnie KIDD, MD;  Location: [facility] OR;  Service: Open Heart Surgery;  Laterality: N/A;   CORONARY PRESSURE/FFR STUDY N/A 05/12/2018   Procedure: INTRAVASCULAR PRESSURE WIRE/FFR STUDY;  Surgeon: Mady Bruckner, MD;  Location: [facility];  Service: Cardiovascular;  Laterality: N/A;   CORONARY PRESSURE/FFR STUDY N/A 04/22/2021   Procedure: INTRAVASCULAR PRESSURE WIRE/FFR STUDY;  Surgeon: Mady Bruckner, MD;  Location: [facility];  Service: Cardiovascular;  Laterality: N/A;   ENDOVEIN HARVEST OF  GREATER SAPHENOUS VEIN Right 05/21/2021   Procedure: ENDOVEIN HARVEST OF GREATER SAPHENOUS VEIN;  Surgeon: Shyrl Linnie KIDD, MD;  Location: [facility] OR;  Service: Open Heart Surgery;  Laterality: Right;   LEFT HEART CATH AND CORONARY ANGIOGRAPHY N/A 05/12/2018   Procedure: LEFT HEART CATH AND CORONARY ANGIOGRAPHY;  Surgeon: Mady Bruckner, MD;  Location: [facility];  Service: Cardiovascular;  Laterality: N/A;   LEFT HEART CATH AND CORONARY ANGIOGRAPHY N/A  04/22/2021   Procedure: LEFT HEART CATH AND CORONARY ANGIOGRAPHY;  Surgeon: Mady Bruckner, MD;  Location: [facility];  Service: Cardiovascular;  Laterality: N/A;   POLYPECTOMY  04/26/2018   Procedure: POLYPECTOMY;  Surgeon: Golda Claudis PENNER, MD;  Location: [facility];  Service: Endoscopy;;  colon    RADIAL ARTERY HARVEST Left 05/21/2021   Procedure: RADIAL ARTERY HARVEST;  Surgeon: Shyrl Linnie KIDD, MD;  Location: [facility] OR;  Service: Open Heart Surgery;  Laterality: Left;   TEE WITHOUT CARDIOVERSION N/A 05/21/2021   Procedure: TRANSESOPHAGEAL ECHOCARDIOGRAM (TEE);  Surgeon: Shyrl Linnie KIDD, MD;  Location: [facility] OR;  Service: Open Heart Surgery;  Laterality: N/A;   TOTAL VAGINAL HYSTERECTOMY      Family History  Problem Relation Age of Onset   Stroke Father    Hypertension Father    Breast cancer Paternal Aunt    Breast cancer Paternal Aunt    Breast cancer Paternal Grandmother      Social History   Tobacco Use   Smoking status: Every Day    Current packs/day: 1.00    Average packs/day: 1 pack/day for 40.0 years (40.0 ttl pk-yrs)    Types: Cigarettes    Passive exposure: Never   Smokeless tobacco: Never   Tobacco comments:    cut back to 1/2 pack a day. Smokig x 59yrs. Did smoke 2 packs x 5 yrs, but cut back a month ago.   Vaping Use   Vaping status: Never Used  Substance Use Topics   Alcohol use: Never   Drug use: Never    Allergies  Allergen Reactions   Percocet [Oxycodone-Acetaminophen ] Rash    @ALL @  No outpatient medications have been marked as taking for the 03/10/24 encounter (Office Visit) with Margrette Taft BRAVO, MD.       Physical Exam BP (!) 142/91   Pulse 73   Ht 5' 1 (1.549 m)   Wt 154 lb (69.9 kg)   BMI 29.10 kg/m   Ambulatory status normal with no assistive devices  GENERAL : APPEARANCE IS NORMAL GROOMING IS GOOD  NORMAL MOOD AND AFFECT  AWAKE ALERT AND ORIENTED X 3   Right SHOULDER  TENDERNESS deltoid, anterior  joint line no pain posterior joint line ROM full flexion full abduction internal rotation to the belt line on the right to T7 on the left STABLE ANTERIORLY POSTERIORLY AND INFERIORLY SKIN CLEAN     PROVOCATIVE TESTS  DROP ARM TEST will PAINFUL ARC 90-1 50 EMPTY CAN -JOBST TEST normal except for pain EXTERNAL ROTATION LAG TEST normal LIFT OFF TEST normal BELLYPRESS TEST normal Hawkins sign positive Neer sign positive   ON THE OTHER SIDE THE LEFT SHOULDER HAS NORMAL SKIN, NO ROM DEFICITS, EXCELLENT STABILITY, AND NORMAL 5/5 MMT STRENGTH   MEDICAL DECISION MAKING  A.  Encounter Diagnosis  Name Primary?   Chronic right shoulder pain Yes    B. DATA ANALYSED:    IMAGING: Independent interpretation of images: DG Shoulder Right Result Date: 03/10/2024 X-ray report shoulder right [facility] scapular Y Axillary  view no Complaint pain right    shoulder The humeral head looks normal and the glenoid looks normal The joint space is symmetric with no narrowing.  There are no surrounding osteophytes subchondral sclerosis or cyst formation The joint is reduced without subluxation or dislocation. The visible chest cavity looks normal with no rib fractures or masses Wire in the sternum from bypass surgery Impression Normal shoulder     Orders: NO  Outside records reviewed: NO  C. MANAGEMENT nonoperative management injection   Procedure note the subacromial injection shoulder RIGHT    Verbal consent was obtained to inject the  RIGHT   Shoulder  Timeout was completed to confirm the injection site is a subacromial space of the  RIGHT  shoulder   Medication used Depo-Medrol  40 mg and lidocaine  1% 3 cc  Anesthesia was provided by ethyl chloride  The injection was performed in the RIGHT  posterior subacromial space. After pinning the skin with alcohol and anesthetized the skin with ethyl chloride the subacromial space was injected using a 20-gauge needle. There were no complications  Sterile  dressing was applied.    Meds ordered this encounter  Medications   methylPREDNISolone  acetate (DEPO-MEDROL ) injection 40 mg

## 2024-03-10 NOTE — Progress Notes (Signed)
  Intake history:  Ht 5' 1 (1.549 m)   Wt 154 lb (69.9 kg)   BMI 29.10 kg/m  Body mass index is 29.1 kg/m.    WHAT ARE WE SEEING YOU FOR TODAY?   right shoulder  How long has this bothered you? (DOI?DOS?WS?)  approximately 6 month(s) ago  Anticoag.  No  Diabetes Yes  Heart disease Yes  Hypertension Yes  SMOKING HX Yes  Kidney disease No  Any ALLERGIES Oxycodone ___________   Treatment:  Have you taken:  Tylenol  Yes  Advil No  Had PT No  Had injection No  Other  _________________________

## 2024-03-14 ENCOUNTER — Ambulatory Visit: Attending: Nurse Practitioner

## 2024-03-14 ENCOUNTER — Ambulatory Visit

## 2024-03-14 DIAGNOSIS — R0989 Other specified symptoms and signs involving the circulatory and respiratory systems: Secondary | ICD-10-CM

## 2024-03-14 DIAGNOSIS — R9431 Abnormal electrocardiogram [ECG] [EKG]: Secondary | ICD-10-CM

## 2024-03-15 LAB — ECHOCARDIOGRAM COMPLETE
AR max vel: 2 cm2
AV Area VTI: 1.93 cm2
AV Area mean vel: 1.89 cm2
AV Mean grad: 5 mmHg
AV Peak grad: 9.7 mmHg
Ao pk vel: 1.56 m/s
Area-P 1/2: 4.17 cm2
Calc EF: 59.1 %
MV VTI: 2.27 cm2
S' Lateral: 2.9 cm
Single Plane A2C EF: 60 %
Single Plane A4C EF: 57.9 %

## 2024-03-17 ENCOUNTER — Other Ambulatory Visit (HOSPITAL_COMMUNITY)
Admission: RE | Admit: 2024-03-17 | Discharge: 2024-03-17 | Disposition: A | Discharge status: Home / Self Care | Source: Ambulatory Visit | Attending: Nurse Practitioner | Admitting: Nurse Practitioner

## 2024-03-17 DIAGNOSIS — Z79899 Other long term (current) drug therapy: Secondary | ICD-10-CM | POA: Diagnosis not present

## 2024-03-17 DIAGNOSIS — R9431 Abnormal electrocardiogram [ECG] [EKG]: Secondary | ICD-10-CM | POA: Diagnosis not present

## 2024-03-17 DIAGNOSIS — E785 Hyperlipidemia, unspecified: Secondary | ICD-10-CM | POA: Diagnosis not present

## 2024-03-17 LAB — CBC
HCT: 49 % — ABNORMAL HIGH (ref 36.0–46.0)
Hemoglobin: 16.4 g/dL — ABNORMAL HIGH (ref 12.0–15.0)
MCH: 29.7 pg (ref 26.0–34.0)
MCHC: 33.5 g/dL (ref 30.0–36.0)
MCV: 88.8 fL (ref 80.0–100.0)
Platelets: 241 K/uL (ref 150–400)
RBC: 5.52 MIL/uL — ABNORMAL HIGH (ref 3.87–5.11)
RDW: 13.1 % (ref 11.5–15.5)
WBC: 12.4 K/uL — ABNORMAL HIGH (ref 4.0–10.5)
nRBC: 0 % (ref 0.0–0.2)

## 2024-03-17 LAB — COMPREHENSIVE METABOLIC PANEL WITH GFR
ALT: 19 U/L (ref 0–44)
AST: 18 U/L (ref 15–41)
Albumin: 3.9 g/dL (ref 3.5–5.0)
Alkaline Phosphatase: 71 U/L (ref 38–126)
Anion gap: 12 (ref 5–15)
BUN: 19 mg/dL (ref 8–23)
CO2: 23 mmol/L (ref 22–32)
Calcium: 9.3 mg/dL (ref 8.9–10.3)
Chloride: 104 mmol/L (ref 98–111)
Creatinine, Ser: 0.9 mg/dL (ref 0.44–1.00)
GFR, Estimated: >60 mL/min (ref >60)
Glucose, Bld: 155 mg/dL — ABNORMAL HIGH (ref 70–99)
Potassium: 3.4 mmol/L — ABNORMAL LOW (ref 3.5–5.1)
Sodium: 139 mmol/L (ref 135–145)
Total Bilirubin: 0.7 mg/dL (ref 0.0–1.2)
Total Protein: 7.1 g/dL (ref 6.5–8.1)

## 2024-03-17 LAB — MAGNESIUM: Magnesium: 2.2 mg/dL (ref 1.7–2.4)

## 2024-03-17 LAB — LIPID PANEL
Cholesterol: 126 mg/dL (ref 0–200)
HDL: 53 mg/dL (ref >40)
LDL Cholesterol: 55 mg/dL (ref 0–99)
Total CHOL/HDL Ratio: 2.4 ratio
Triglycerides: 88 mg/dL (ref <150)
VLDL: 18 mg/dL (ref 0–40)

## 2024-03-23 ENCOUNTER — Ambulatory Visit (INDEPENDENT_AMBULATORY_CARE_PROVIDER_SITE_OTHER): Admitting: Internal Medicine

## 2024-03-23 ENCOUNTER — Encounter: Payer: Self-pay | Admitting: Internal Medicine

## 2024-03-23 VITALS — BP 136/80 | HR 80 | Ht 61.0 in | Wt 154.2 lb

## 2024-03-23 DIAGNOSIS — E1149 Type 2 diabetes mellitus with other diabetic neurological complication: Secondary | ICD-10-CM | POA: Diagnosis not present

## 2024-03-23 DIAGNOSIS — E782 Mixed hyperlipidemia: Secondary | ICD-10-CM

## 2024-03-23 DIAGNOSIS — Z7985 Long-term (current) use of injectable non-insulin antidiabetic drugs: Secondary | ICD-10-CM

## 2024-03-23 DIAGNOSIS — I25119 Atherosclerotic heart disease of native coronary artery with unspecified angina pectoris: Secondary | ICD-10-CM | POA: Diagnosis not present

## 2024-03-23 DIAGNOSIS — M47816 Spondylosis without myelopathy or radiculopathy, lumbar region: Secondary | ICD-10-CM

## 2024-03-23 DIAGNOSIS — I10 Essential (primary) hypertension: Secondary | ICD-10-CM | POA: Diagnosis not present

## 2024-03-23 DIAGNOSIS — E1151 Type 2 diabetes mellitus with diabetic peripheral angiopathy without gangrene: Secondary | ICD-10-CM

## 2024-03-23 MED ORDER — EMPAGLIFLOZIN 10 MG PO TABS: 10.0000 mg | ORAL_TABLET | Freq: Every day | ORAL | 1 refills | Status: AC

## 2024-03-23 MED ORDER — NITROGLYCERIN 0.4 MG SL SUBL
0.4000 mg | SUBLINGUAL_TABLET | SUBLINGUAL | 1 refills | Status: AC | PRN
Start: 2024-03-23 — End: 2024-06-21

## 2024-03-23 MED ORDER — METOPROLOL TARTRATE 25 MG PO TABS: 12.5000 mg | ORAL_TABLET | Freq: Two times a day (BID) | ORAL | 1 refills | Status: AC

## 2024-03-23 MED ORDER — OLMESARTAN MEDOXOMIL 20 MG PO TABS: 20.0000 mg | ORAL_TABLET | Freq: Every day | ORAL | 1 refills | Status: AC

## 2024-03-23 MED ORDER — GABAPENTIN 100 MG PO CAPS: 200.0000 mg | ORAL_CAPSULE | Freq: Every day | ORAL | 1 refills | Status: AC

## 2024-03-23 NOTE — Patient Instructions (Addendum)
 Please start taking Vitamin D  2000 IU once daily.  Please continue taking Olmesartan  20 mg once daily.  Please continue to take medications as prescribed.  Please continue to follow low carb diet and perform moderate exercise/walking as tolerated.

## 2024-03-23 NOTE — Progress Notes (Unsigned)
 Established Patient Office Visit  Subjective:  Patient ID: Lauren David, female    DOB: 01-24-1961  Age: 63 y.o. MRN: [id number]  CC:  Chief Complaint  Patient presents with   Hypertension    3 month f/u, has questions about bp medication. Had EKG with cardiology has concerns will go back to see them next week.    Diabetes    3 month f/u     HPI Lauren David is a 63 y.o. female with past medical history of CAD s/p CABG, HTN, COPD, type 2 DM, Osteopenia, chronic left hip pain and tobacco abuse who presents for f/u of her chronic medical conditions.  CAD s/p CABG: She denies any chest pain or dyspnea currently. She is taking Aspirin  and statin currently. She is on Metoprolol  as well.  HTN: BP is wnl now at home. Patient denies chest pain, dyspnea or palpitations. She takes Metoprolol  12.5 mg BID for CAD. She has started Olmesartan  20 mg once daily now as she had hypotension with 40 mg dose.  Type 2 DM: She has been taking Mounjaro  10 mg qw and Jardiance  10 mg QD. Her HbA1c was 5.8 in 05/25. Her blood glucose has been between 80-130. Denies any episode of hypoglycemia. She denies any dysuria or hematuria. She has noticed significant improvement in her foot pain with gabapentin . US  ABI was wnl.  GERD: She had noticed heartburn/acid reflux and belching since starting Mounjaro , but is better with Pepcid  now.  She has mild nausea, but denies any vomiting.  Denies any melena or hematochezia.  She has cut down smoking and does not smoke everyday now. She has tried Chantix  and Wellbutrin , but was not able to quit smoking.  She reports chronic bilateral hip pain, L > R.  She denies any recent injury or fall.  Pain is constant, dull, worse with walking and better with Celebrex . She was seen by orthopedic surgeon, has noticed improvement with PT.    Past Medical History:  Diagnosis Date   Anxiety    Asthma    CAD (coronary artery disease)    Multivessel disease status post CABG October  2022   Chronic back pain    COPD (chronic obstructive pulmonary disease) (HCC)    Depression    Essential hypertension    Headache    Hyperlipidemia    Myocardial infarction (HCC)    Pneumonia    Type 2 diabetes mellitus (HCC)     Past Surgical History:  Procedure Laterality Date   BREAST BIOPSY Left 2016   fibroadenoma with calcifications and fibrocystic changes   COLONOSCOPY N/A 04/26/2018   Procedure: COLONOSCOPY;  Surgeon: Golda Claudis PENNER, MD;  Location: [facility];  Service: Endoscopy;  Laterality: N/A;  1:15   CORONARY ARTERY BYPASS GRAFT N/A 05/21/2021   Procedure: CORONARY ARTERY BYPASS GRAFTING (CABG) X4 ON PUMP USING LEFT INTERNAL MAMMARY ARTERY AND ENDOSCOPICALLY HARVESTED RIGHT GREATER SAPHENOUS VEIN;  Surgeon: Shyrl Linnie KIDD, MD;  Location: [facility];  Service: Open Heart Surgery;  Laterality: N/A;   CORONARY PRESSURE/FFR STUDY N/A 05/12/2018   Procedure: INTRAVASCULAR PRESSURE WIRE/FFR STUDY;  Surgeon: Mady Bruckner, MD;  Location: [facility];  Service: Cardiovascular;  Laterality: N/A;   CORONARY PRESSURE/FFR STUDY N/A 04/22/2021   Procedure: INTRAVASCULAR PRESSURE WIRE/FFR STUDY;  Surgeon: Mady Bruckner, MD;  Location: [facility];  Service: Cardiovascular;  Laterality: N/A;   ENDOVEIN HARVEST OF GREATER SAPHENOUS VEIN Right 05/21/2021   Procedure: ENDOVEIN HARVEST OF GREATER SAPHENOUS  VEIN;  Surgeon: Shyrl Linnie KIDD, MD;  Location: [facility];  Service: Open Heart Surgery;  Laterality: Right;   LEFT HEART CATH AND CORONARY ANGIOGRAPHY N/A 05/12/2018   Procedure: LEFT HEART CATH AND CORONARY ANGIOGRAPHY;  Surgeon: Mady Bruckner, MD;  Location: [facility];  Service: Cardiovascular;  Laterality: N/A;   LEFT HEART CATH AND CORONARY ANGIOGRAPHY N/A 04/22/2021   Procedure: LEFT HEART CATH AND CORONARY ANGIOGRAPHY;  Surgeon: Mady Bruckner, MD;  Location: [facility];  Service: Cardiovascular;  Laterality: N/A;   POLYPECTOMY   04/26/2018   Procedure: POLYPECTOMY;  Surgeon: Golda Claudis PENNER, MD;  Location: [facility];  Service: Endoscopy;;  colon    RADIAL ARTERY HARVEST Left 05/21/2021   Procedure: RADIAL ARTERY HARVEST;  Surgeon: Shyrl Linnie KIDD, MD;  Location: [facility];  Service: Open Heart Surgery;  Laterality: Left;   TEE WITHOUT CARDIOVERSION N/A 05/21/2021   Procedure: TRANSESOPHAGEAL ECHOCARDIOGRAM (TEE);  Surgeon: Shyrl Linnie KIDD, MD;  Location[facility];  Service: Open Heart Surgery;  Laterality: N/A;   TOTAL VAGINAL HYSTERECTOMY      Family History  Problem Relation Age of Onset   Stroke Father    Hypertension Father    Breast cancer Paternal Aunt    Breast cancer Paternal Aunt    Breast cancer Paternal Grandmother     Social History   Socioeconomic History   Marital status: Married    Spouse name: Not on file   Number of children: Not on file   Years of education: Not on file   Highest education level: Not on file  Occupational History   Not on file  Tobacco Use   Smoking status: Every Day    Current packs/day: 1.00    Average packs/day: 1 pack/day for 40.0 years (40.0 ttl pk-yrs)    Types: Cigarettes    Passive exposure: Never   Smokeless tobacco: Never   Tobacco comments:    cut back to 1/2 pack a day. Smokig x 53yrs. Did smoke 2 packs x 5 yrs, but cut back a month ago.   Vaping Use   Vaping status: Never Used  Substance and Sexual Activity   Alcohol use: Never   Drug use: Never   Sexual activity: Not on file  Other Topics Concern   Not on file  Social History Narrative   Not on file   Social Drivers of Health   Financial Resource Strain: Low Risk  (04/09/2023)   Overall Financial Resource Strain (CARDIA)    Difficulty of Paying Living Expenses: Not hard at all  Food Insecurity: No Food Insecurity (04/09/2023)   Hunger Vital Sign    Worried About Running Out of Food in the Last Year: Never true    Ran Out of Food in the Last Year: Never true  Transportation Needs:  No Transportation Needs (04/09/2023)   PRAPARE - Administrator, Civil Service (Medical): No    Lack of Transportation (Non-Medical): No  Physical Activity: Sufficiently Active (04/09/2023)   Exercise Vital Sign    Days of Exercise per Week: 7 days    Minutes of Exercise per Session: 30 min  Stress: Stress Concern Present (04/09/2023)   [employer] of Occupational Health - Occupational Stress Questionnaire    Feeling of Stress : To some extent  Social Connections: Moderately Isolated (04/09/2023)   Social Connection and Isolation Panel    Frequency of Communication with Friends and Family: More than three times a week  Frequency of Social Gatherings with Friends and Family: More than three times a week    Attends Religious Services: More than 4 times per year    Active Member of Clubs or Organizations: No    Attends Banker Meetings: Never    Marital Status: Separated  Intimate Partner Violence: Not At Risk (04/09/2023)   Humiliation, Afraid, Rape, and Kick questionnaire    Fear of Current or Ex-Partner: No    Emotionally Abused: No    Physically Abused: No    Sexually Abused: No    Outpatient Medications Prior to Visit  Medication Sig Dispense Refill   acetaminophen  (TYLENOL ) 500 MG tablet Take 1,000 mg by mouth every 6 (six) hours as needed for moderate pain or headache.     albuterol  (PROVENTIL ) (2.5 MG/3ML) 0.083% nebulizer solution ONE VIAL BY NEBULIZATION EVERY 6 HOURS AS NEEDED FOR WHEEZING OR SHORTNESS OF BREATH. (Patient taking differently: Take 2.5 mg by nebulization every 6 (six) hours as needed for wheezing.) 180 mL 0   albuterol  (VENTOLIN  HFA) 108 (90 Base) MCG/ACT inhaler Inhale 2 puffs into the lungs every 6 (six) hours as needed for wheezing or shortness of breath. 18 g 3   aspirin  EC 81 MG tablet Take 81 mg by mouth daily. Swallow whole.     blood glucose meter kit and supplies Dispense based on patient and insurance preference. Use up to four  times daily as directed. (FOR ICD-10 E10.9, E11.9). 1 each 0   celecoxib  (CELEBREX ) 200 MG capsule Take 1 capsule (200 mg total) by mouth daily as needed for mild pain (pain score 1-3) or moderate pain (pain score 4-6). 30 capsule 0   famotidine  (PEPCID ) 40 MG tablet Take 1 tablet (40 mg total) by mouth daily. 90 tablet 3   ketoconazole  (NIZORAL ) 2 % cream Apply 1 Application topically daily. 15 g 0   rosuvastatin  (CRESTOR ) 40 MG tablet Take 1 tablet (40 mg total) by mouth daily. 90 tablet 1   tirzepatide  (MOUNJARO ) 10 MG/0.5ML Pen Inject 10 mg into the skin once a week. 6 mL 1   umeclidinium bromide  (INCRUSE ELLIPTA ) 62.5 MCG/ACT AEPB Inhale 1 puff into the lungs daily. 30 each 5   empagliflozin  (JARDIANCE ) 10 MG TABS tablet Take 1 tablet (10 mg total) by mouth daily. 90 tablet 1   gabapentin  (NEURONTIN ) 100 MG capsule Take 2 capsules (200 mg total) by mouth at bedtime. 180 capsule 1   metoprolol  tartrate (LOPRESSOR ) 25 MG tablet Take 0.5 tablets (12.5 mg total) by mouth 2 (two) times daily. 90 tablet 1   nitroGLYCERIN  (NITROSTAT ) 0.4 MG SL tablet Place 1 tablet (0.4 mg total) under the tongue every 5 (five) minutes x 3 doses as needed for chest pain (If no relief after 3rd dose, GO TO ED). 30 tablet 1   olmesartan  (BENICAR ) 40 MG tablet Take 1 tablet (40 mg total) by mouth daily. 90 tablet 1   Vitamin D , Ergocalciferol , 50000 units CAPS TAKE 1 CAPSULE BY MOUTH EVERY 7 DAYS. 12 capsule 0   No facility-administered medications prior to visit.    Allergies  Allergen Reactions   Percocet [Oxycodone-Acetaminophen ] Rash    ROS Review of Systems  Constitutional:  Negative for chills and fever.  HENT:  Negative for congestion, postnasal drip, sinus pressure and sinus pain.   Eyes:  Negative for pain and discharge.  Respiratory:  Negative for cough, shortness of breath and wheezing.   Cardiovascular:  Negative for chest pain and palpitations.  Gastrointestinal:  Negative for abdominal pain,  diarrhea and vomiting.  Endocrine: Negative for polydipsia and polyuria.  Genitourinary:  Negative for dysuria and hematuria.  Musculoskeletal:  Positive for arthralgias (B/l hip, right shoulder) and back pain. Negative for neck pain and neck stiffness.  Skin:  Negative for rash.  Neurological:  Negative for dizziness and weakness.  Psychiatric/Behavioral:  Positive for sleep disturbance. Negative for agitation and behavioral problems.       Objective:    Physical Exam Vitals reviewed.  Constitutional:      General: She is not in acute distress.    Appearance: She is not diaphoretic.  HENT:     Head: Normocephalic and atraumatic.     Mouth/Throat:     Mouth: Mucous membranes are moist.  Eyes:     General: No scleral icterus.    Extraocular Movements: Extraocular movements intact.  Cardiovascular:     Rate and Rhythm: Normal rate and regular rhythm.     Heart sounds: Normal heart sounds. No murmur heard. Pulmonary:     Breath sounds: Normal breath sounds. No wheezing or rales.  Musculoskeletal:     Cervical back: Neck supple. No tenderness.     Right hip: No tenderness. Normal range of motion.     Left hip: No tenderness. Normal range of motion.     Right lower leg: No edema.     Left lower leg: No edema.     Comments: Trigger finger of left hand middle finger  Skin:    General: Skin is warm.     Findings: No rash.  Neurological:     General: No focal deficit present.     Mental Status: She is alert and oriented to person, place, and time.     Sensory: No sensory deficit.     Motor: No weakness.  Psychiatric:        Mood and Affect: Mood normal.        Behavior: Behavior normal.     BP 136/80 (BP Location: Left Arm)   Pulse 80   Ht 5' 1 (1.549 m)   Wt 154 lb 3.2 oz (69.9 kg)   SpO2 94%   BMI 29.14 kg/m  Wt Readings from Last 3 Encounters:  03/23/24 154 lb 3.2 oz (69.9 kg)  03/10/24 154 lb (69.9 kg)  02/18/24 158 lb 9.6 oz (71.9 kg)    Lab Results   Component Value Date   TSH 1.380 01/24/2021   Lab Results  Component Value Date   WBC 12.4 (H) 03/17/2024   HGB 16.4 (H) 03/17/2024   HCT 49.0 (H) 03/17/2024   MCV 88.8 03/17/2024   PLT 241 03/17/2024   Lab Results  Component Value Date   NA 139 03/17/2024   K 3.4 (L) 03/17/2024   CO2 23 03/17/2024   GLUCOSE 155 (H) 03/17/2024   BUN 19 03/17/2024   CREATININE 0.90 03/17/2024   BILITOT 0.7 03/17/2024   ALKPHOS 71 03/17/2024   AST 18 03/17/2024   ALT 19 03/17/2024   PROT 7.1 03/17/2024   ALBUMIN  3.9 03/17/2024   CALCIUM  9.3 03/17/2024   ANIONGAP 12 03/17/2024   EGFR 99 12/17/2023   Lab Results  Component Value Date   CHOL 126 03/17/2024   Lab Results  Component Value Date   HDL 53 03/17/2024   Lab Results  Component Value Date   LDLCALC 55 03/17/2024   Lab Results  Component Value Date   TRIG 88 03/17/2024   Lab Results  Component Value Date  CHOLHDL 2.4 03/17/2024   Lab Results  Component Value Date   HGBA1C 5.8 (H) 12/17/2023      Assessment & Plan:   Problem List Items Addressed This Visit    Problem List Items Addressed This Visit       Cardiovascular and Mediastinum   Primary hypertension   BP Readings from Last 1 Encounters:  03/23/24 136/80   Well-controlled On metoprolol  12.5 mg BID and olmesartan  20 mg QD Had increased dose of olmesartan  to 40 mg QD in the last visit, but had dizziness with hypotension at home and has been taking 20 mg dose instead, will continue olmesartan  20 mg QD for now Counseled for compliance with the medications Advised DASH diet and walking as tolerated      Relevant Medications   metoprolol  tartrate (LOPRESSOR ) 25 MG tablet   nitroGLYCERIN  (NITROSTAT ) 0.4 MG SL tablet   olmesartan  (BENICAR ) 20 MG tablet   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)   Lab Results  Component Value Date   HGBA1C 5.8 (H) 12/17/2023   Well-controlled On Mounjaro  10 mg qw and Jardiance  10 mg  daily  Does not tolerate Metformin , 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 - on Gabapentin  Advised to follow diabetic diet F/u CMP and lipid panel Diabetic eye exam: Advised to follow up with Ophthalmology for diabetic eye exam      Relevant Medications   empagliflozin  (JARDIANCE ) 10 MG TABS tablet   metoprolol  tartrate (LOPRESSOR ) 25 MG tablet   nitroGLYCERIN  (NITROSTAT ) 0.4 MG SL tablet   olmesartan  (BENICAR ) 20 MG tablet   Coronary artery disease   S/p CABG On aspirin  and statin On Metoprolol  Followed by cardiology - recently had echocardiogram due to concern for atypical angina considering T wave abnormality in inferolateral leads on EKG No chest pain or dyspnea currently      Relevant Medications   metoprolol  tartrate (LOPRESSOR ) 25 MG tablet   nitroGLYCERIN  (NITROSTAT ) 0.4 MG SL tablet   olmesartan  (BENICAR ) 20 MG tablet     Endocrine   Diabetic neuropathy - Primary   Foot pain improved with gabapentin  200 mg qHS      Relevant Medications   empagliflozin  (JARDIANCE ) 10 MG TABS tablet   gabapentin  (NEURONTIN ) 100 MG capsule   olmesartan  (BENICAR ) 20 MG tablet   Other Relevant Orders   Bayer DCA Hb A1c Waived   Microalbumin / creatinine urine ratio (Completed)     Musculoskeletal and Integument   Lumbar spondylosis   Has chronic low back pain and b/l hip pain Has improved with PT, was evaluated by Orthopedic surgeon as well Avoid heavy lifting and frequent bending Celecoxib  PRN for pain, alternate with Tylenol  arthritis        Other   Mixed hyperlipidemia   On Crestor  40 mg QD, needs to remain compliant      Relevant Medications   metoprolol  tartrate (LOPRESSOR ) 25 MG tablet   nitroGLYCERIN  (NITROSTAT ) 0.4 MG SL tablet   olmesartan  (BENICAR ) 20 MG tablet      Meds ordered this encounter  Medications   empagliflozin  (JARDIANCE ) 10 MG TABS tablet    Sig: Take 1 tablet (10 mg total) by mouth daily.    Dispense:  90 tablet    Refill:  1   gabapentin   (NEURONTIN ) 100 MG capsule    Sig: Take 2 capsules (200 mg total) by mouth at bedtime.    Dispense:  180 capsule    Refill:  1    NA  metoprolol  tartrate (LOPRESSOR ) 25 MG tablet    Sig: Take 0.5 tablets (12.5 mg total) by mouth 2 (two) times daily.    Dispense:  90 tablet    Refill:  1   nitroGLYCERIN  (NITROSTAT ) 0.4 MG SL tablet    Sig: Place 1 tablet (0.4 mg total) under the tongue every 5 (five) minutes x 3 doses as needed for chest pain (If no relief after 3rd dose, GO TO ED).    Dispense:  30 tablet    Refill:  1    11/11/22 New Start   olmesartan  (BENICAR ) 20 MG tablet    Sig: Take 1 tablet (20 mg total) by mouth daily.    Dispense:  90 tablet    Refill:  1    Dose change - 03/23/24, please cancel 40 mg prescription    Follow-up: Return in about 4 months (around 07/23/2024) for DM and HTN.    Suzzane MARLA Blanch, MD

## 2024-03-25 DIAGNOSIS — M47816 Spondylosis without myelopathy or radiculopathy, lumbar region: Secondary | ICD-10-CM | POA: Insufficient documentation

## 2024-03-25 LAB — MICROALBUMIN / CREATININE URINE RATIO
Creatinine, Urine: 44.1 mg/dL
Microalb/Creat Ratio: 57 mg/g{creat} — ABNORMAL HIGH (ref 0–29)
Microalbumin, Urine: 25.1 ug/mL

## 2024-03-25 NOTE — Assessment & Plan Note (Signed)
Foot pain improved with gabapentin 200 mg qHS

## 2024-03-25 NOTE — Assessment & Plan Note (Signed)
 Has chronic low back pain and b/l hip pain Has improved with PT, was evaluated by Orthopedic surgeon as well Avoid heavy lifting and frequent bending Celecoxib  PRN for pain, alternate with Tylenol  arthritis

## 2024-03-25 NOTE — Assessment & Plan Note (Signed)
 Lab Results  Component Value Date   HGBA1C 5.8 (H) 12/17/2023   Well-controlled On Mounjaro  10 mg qw and Jardiance  10 mg daily  Does not tolerate Metformin , 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 - on Gabapentin  Advised to follow diabetic diet F/u CMP and lipid panel Diabetic eye exam: Advised to follow up with Ophthalmology for diabetic eye exam

## 2024-03-25 NOTE — Assessment & Plan Note (Signed)
 BP Readings from Last 1 Encounters:  03/23/24 136/80   Well-controlled On metoprolol  12.5 mg BID and olmesartan  20 mg QD Had increased dose of olmesartan  to 40 mg QD in the last visit, but had dizziness with hypotension at home and has been taking 20 mg dose instead, will continue olmesartan  20 mg QD for now Counseled for compliance with the medications Advised DASH diet and walking as tolerated

## 2024-03-25 NOTE — Assessment & Plan Note (Signed)
 On Crestor  40 mg QD, needs to remain compliant

## 2024-03-25 NOTE — Assessment & Plan Note (Addendum)
 S/p CABG On aspirin  and statin On Metoprolol  Followed by cardiology - recently had echocardiogram due to concern for atypical angina considering T wave abnormality in inferolateral leads on EKG No chest pain or dyspnea currently

## 2024-03-27 ENCOUNTER — Ambulatory Visit: Payer: Self-pay | Admitting: Nurse Practitioner

## 2024-03-29 ENCOUNTER — Encounter: Payer: Self-pay | Admitting: Nurse Practitioner

## 2024-03-29 ENCOUNTER — Ambulatory Visit: Attending: Nurse Practitioner | Admitting: Nurse Practitioner

## 2024-03-29 VITALS — BP 118/74 | HR 74 | Ht 61.0 in | Wt 153.4 lb

## 2024-03-29 DIAGNOSIS — I2510 Atherosclerotic heart disease of native coronary artery without angina pectoris: Secondary | ICD-10-CM

## 2024-03-29 DIAGNOSIS — I10 Essential (primary) hypertension: Secondary | ICD-10-CM

## 2024-03-29 DIAGNOSIS — E785 Hyperlipidemia, unspecified: Secondary | ICD-10-CM

## 2024-03-29 DIAGNOSIS — J449 Chronic obstructive pulmonary disease, unspecified: Secondary | ICD-10-CM | POA: Diagnosis not present

## 2024-03-29 DIAGNOSIS — I6523 Occlusion and stenosis of bilateral carotid arteries: Secondary | ICD-10-CM | POA: Diagnosis not present

## 2024-03-29 DIAGNOSIS — I25119 Atherosclerotic heart disease of native coronary artery with unspecified angina pectoris: Secondary | ICD-10-CM

## 2024-03-29 DIAGNOSIS — Z720 Tobacco use: Secondary | ICD-10-CM | POA: Diagnosis not present

## 2024-03-29 DIAGNOSIS — F439 Reaction to severe stress, unspecified: Secondary | ICD-10-CM

## 2024-03-29 NOTE — Patient Instructions (Addendum)

## 2024-03-29 NOTE — Progress Notes (Unsigned)
 Office Visit    Patient Name: Lauren David Date of Encounter: 03/29/2024 PCP:  Tobie Suzzane POUR, MD [facility]  Cardiologist:  Jayson Sierras, MD  Advanced Practice Provider:  No care team member to display Electrophysiologist:  None   Chief Complaint and HPI    Lauren David is a 63 y.o. female with a hx of CAD, s/p CABG in 2022, hypertension, hyperlipidemia, type 2 diabetes, COPD, anxiety, and asthma, who presents today for 1 year follow-up.  Last seen by Dr. Sierras on March 24, 2022.  Was doing well at the time.  11/11/2022 - Today she presents for 93-month follow-up.  She states she is doing well. Did have Covid-19 07/2022, recovered well. Denies any chest pain, shortness of breath, palpitations, syncope, presyncope, dizziness, orthopnea, PND, swelling or significant weight changes, acute bleeding, or claudication.  02/18/2024 - Here for follow-up.  Doing well overall.  She does tell me that her PCP recently had increased her olmesartan  to 40 mg daily in addition to increasing her Mounjaro  at the same time, experienced dizziness after this.  She self reduced her olmesartan  down to 20 mg daily to help with the dizziness that has helped.  She smokes around 1 pack/day, contemplating about quitting smoking. Denies any chest pain, shortness of breath, palpitations, syncope, presyncope, dizziness, orthopnea, PND, swelling or significant weight changes, acute bleeding, or claudication.  03/29/2024- She is here for follow-up. Has been under increased stress at this time. She tells me daughter took 100 units of insulin  for weight loss and hit the floor, EMS was called and after hole was found in floor, social services involved and all 8 grandchildren/foster children are now living with her, ranging from teenage years to upper 20's. Denies any chest pain, shortness of breath, palpitations, syncope, presyncope, dizziness, orthopnea, PND, swelling or significant weight  changes, acute bleeding, or claudication.  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   EKGs/Labs/Other Studies Reviewed:   The following studies were reviewed today:  EKG:  EKG Interpretation Date/Time:  Tuesday March 29 2024 10:05:41 EDT Ventricular Rate:  72 PR Interval:  172 QRS Duration:  102 QT Interval:  342 QTC Calculation: 374 R Axis:   93  Text Interpretation: Normal sinus rhythm Rightward axis ST & T wave abnormality, consider inferior ischemia When compared with ECG of 18-Feb-2024 10:36, No significant change was found Confirmed by Miriam Norris [phone]) on 04/01/2024 12:53:22 PM   Echo 03/2024:  1. Left ventricular ejection fraction, by estimation, is 55 to 60%. Left  ventricular ejection fraction by 3D volume is 58 %. The left ventricle has  normal function. The left ventricle has no regional wall motion  abnormalities. Left ventricular diastolic   parameters are consistent with Grade I diastolic dysfunction (impaired  relaxation). The average left ventricular global longitudinal strain is  -18.4 %. The global longitudinal strain is normal.   2. Right ventricular systolic function is normal. The right ventricular  size is normal. Tricuspid regurgitation signal is inadequate for assessing  PA pressure.   3. The mitral valve is normal in structure. No evidence of mitral valve  regurgitation. No evidence of mitral stenosis.   4. The aortic valve is tricuspid. Aortic valve regurgitation is not  visualized. No aortic stenosis is present. Aortic valve mean gradient  measures 5.0 mmHg. Aortic valve Vmax measures 1.56 m/s.   5. The inferior vena cava is normal in size with greater than 50%  respiratory variability,  suggesting right atrial pressure of 3 mmHg.   Comparison(s): A prior study was performed on 04/25/2021. No significant  change from prior study. EF 60-65%.  Carotid duplex 03/2024:  Summary:  Right Carotid: Velocities in the right ICA are  consistent with a 40-59% stenosis. Non-hemodynamically significant plaque <50% noted  in the CCA. The ECA appears <50% stenosed.   Left Carotid: Velocities in the left ICA are consistent with a 40-59%  stenosis. Non-hemodynamically significant plaque <50% noted in the  CCA. The ECA appears <50% stenosed.   Vertebrals:  Bilateral vertebral arteries demonstrate antegrade flow.  Subclavians: Normal flow hemodynamics were seen in bilateral subclavian arteries.   *See table(s) above for measurements and observations.  Suggest follow up study in 12 months.   TEE 05/2021: Complications: No known complications during this procedure.  POST-OP IMPRESSIONS  Limited Post-CPB exam: The patient separated easily from CPB.  _ Left Ventricle: The left ventricular function is unchanged from  pre-bypass  images. Contractility and wall motion remain normal. Overall EF appears  >65%.  _ Right Ventricle: The right ventricular function appears normal,  unchanged from  pre-bypass images.  _ Aortic Valve: The aortic valve function appears unchanged from  pre-bypass  images.  _ Mitral Valve: The mitral valve function appears essentially unchanged  from  pre-bypass images. MR is trivial.  _ Tricuspid Valve: The tricuspid valve function appears unchanged from  pre-bypass images.   Echo 04/2021: 1. Left ventricular ejection fraction, by estimation, is 60 to 65%. The  left ventricle has normal function. The left ventricle has no regional  wall motion abnormalities. Left ventricular diastolic parameters are  consistent with Grade I diastolic  dysfunction (impaired relaxation).   2. Right ventricular systolic function is normal. The right ventricular  size is normal.   3. The mitral valve is normal in structure. No evidence of mitral valve  regurgitation. No evidence of mitral stenosis.   4. The aortic valve is tricuspid. Aortic valve regurgitation is not  visualized. No aortic stenosis is present.   5.  The inferior vena cava is normal in size with greater than 50%  respiratory variability, suggesting right atrial pressure of 3 mmHg.  LHC 04/2021:  Conclusion: Significant three-vessel CAD that has progressed since 2019, inlcluding long segment of mid LAD disease of up to 75% (RFR = 0.83), 60% proximal LCx stenosis followed by sequential 30% and 90% OM1 lesions, and 70% mid RCA disease. Normal left ventricular systolic function and filling pressure.   Recommendations: Outpatient cardiac surgery consultation for CABG. Aggressive secondary prevention. Obtain TTE prior to cardiac surgery consultation.  Myoview  04/2021:    Findings are equivocal. The study is low risk.   No ST deviation was noted.   LV perfusion is abnormal. Defect 1: There is a medium defect with mild reduction in uptake present in the apical to basal anterior location(s) that is partially reversible. There is normal wall motion in the defect area. Mild anterior ischemia versus variable soft tissue attenuation artifact.  There is significant breast attenuation at baseline.   Left ventricular function is normal. End diastolic cavity size is normal. End systolic cavity size is normal.   Overall equivocal findings with partially reversible, apical to basal anterior defect in the setting of significant breast attenuation artifact at baseline.  Cannot exclude mild anterior ischemia although variable soft tissue attenuation could also be responsible.  LVEF is normal.  Recent Labs: 03/17/2024: ALT 19; BUN 19; Creatinine, Ser 0.90; Hemoglobin 16.4; Magnesium  2.2; Platelets 241;  Potassium 3.4; Sodium 139  Recent Lipid Panel    Component Value Date/Time   CHOL 126 03/17/2024 0936   CHOL 125 12/17/2023 0908   TRIG 88 03/17/2024 0936   HDL 53 03/17/2024 0936   HDL 48 12/17/2023 0908   CHOLHDL 2.4 03/17/2024 0936   VLDL 18 03/17/2024 0936   LDLCALC 55 03/17/2024 0936   LDLCALC 58 12/17/2023 0908     Review of Systems    All  other systems reviewed and are otherwise negative except as noted above.  Physical Exam    VS:  BP 118/74   Pulse 74   Ht 5' 1 (1.549 m)   Wt 153 lb 6.4 oz (69.6 kg)   SpO2 96%   BMI 28.98 kg/m  , BMI Body mass index is 28.98 kg/m.  Wt Readings from Last 3 Encounters:  03/29/24 153 lb 6.4 oz (69.6 kg)  03/23/24 154 lb 3.2 oz (69.9 kg)  03/10/24 154 lb (69.9 kg)     GEN: Well developed, well nourished, 63 y.o. female in no acute distress. HEENT: normal. Neck: Supple, no JVD, left carotid bruit, no right carotid bruit, no masses. Cardiac: S1/S2, RRR, no murmurs, rubs, or gallops. No clubbing, cyanosis, edema.  Radials/PT 2+ and equal bilaterally.  Respiratory:  Respirations regular and unlabored, clear and diminished to auscultation bilaterally MS: No deformity or atrophy. Skin: Warm and dry, no rash. Neuro:  Strength and sensation are intact. Psych: Normal affect.  Assessment & Plan    CAD, s/p CABG in 2022, EKG changes/abnormal EKG Stable with no anginal symptoms. No indication for ischemic evaluation.  Continue ASA, Metoprolol  tartrate, and Crestor  to 40 mg daily.  Heart healthy diet and regular cardiovascular exercise encouraged.   Hypertension BP stable. Discussed to monitor BP at home at least 2 hours after medications and sitting for 5-10 minutes. No medication changes at this time besides what is noted above. Heart healthy diet and regular cardiovascular exercise encouraged.   Hyperlipidemia, carotid artery stenosis Most recent LDL 55. Heart healthy diet and regular cardiovascular exercise encouraged.  Most recent carotid duplex revealed bilateral moderate carotid artery stenosis. Continue current medication regimen. Plan to update study in 1 year.   COPD . Denies any recent issues. No medication changes at this time. Continue to follow with PCP.  5. Tobacco abuse Smoking cessation encouraged and discussed.   6. Stress Denies any red flag signs or symptoms. No  SI/abuse/neglect noted by patient. Continue to follow with PCP. Support provided. Pt confirms social services is involved.   I spent a total duration of 30 minutes reviewing prior notes, reviewing outside records including  labs, EKG today, face-to-face counseling of medical condition, pathophysiology, evaluation, management, and documenting the findings in the note.   Disposition: Follow up in 6 months with Jayson Sierras, MD or APP.  Signed, Almarie Crate, NP

## 2024-04-11 ENCOUNTER — Ambulatory Visit (INDEPENDENT_AMBULATORY_CARE_PROVIDER_SITE_OTHER): Payer: Medicare Other

## 2024-04-11 VITALS — Ht 61.0 in | Wt 153.0 lb

## 2024-04-11 DIAGNOSIS — Z0000 Encounter for general adult medical examination without abnormal findings: Secondary | ICD-10-CM

## 2024-04-11 DIAGNOSIS — Z1231 Encounter for screening mammogram for malignant neoplasm of breast: Secondary | ICD-10-CM

## 2024-04-11 NOTE — Patient Instructions (Addendum)
 [name],  Thank you for taking the time for your Medicare Wellness Visit. I appreciate your continued commitment to your health goals. Please review the care plan we discussed, and feel free to reach out if I can assist you further.  Medicare recommends these wellness visits once per year to help you and your care team stay ahead of potential health issues. These visits are designed to focus on prevention, allowing your provider to concentrate on managing your acute and chronic conditions during your regular appointments.  Please note that Annual Wellness Visits do not include a physical exam. Some assessments may be limited, especially if the visit was conducted virtually. If needed, we may recommend a separate in-person follow-up with your provider.  Ongoing Care Seeing your primary care provider every 3 to 6 months helps us  monitor your health and provide consistent, personalized care.   A nurse visit appointment was scheduled for you today to have your flu vaccine given. May 23, 2024 at 10:30 am in office   Referrals If a referral was made during today's visit and you haven't received any updates within two weeks, please contact the referred provider directly to check on the status.  To Schedule Your Mammogram, Call:  [facility] @  [phone]   Recommended Screenings:  Health Maintenance  Topic Date Due   DTaP/Tdap/Td vaccine (1 - Tdap) Never done   Flu Shot  03/04/2024   COVID-19 Vaccine (5 - 2025-26 season) 04/04/2024   Screening for Lung Cancer  05/12/2024   Hemoglobin A1C  06/18/2024   Mammogram  07/13/2024   Complete foot exam   08/19/2024   Eye exam for diabetics  03/09/2025   Yearly kidney function blood test for diabetes  03/17/2025   Yearly kidney health urinalysis for diabetes  03/23/2025   Medicare Annual Wellness Visit  04/11/2025   Colon Cancer Screening  04/26/2025   Pneumococcal Vaccine for age over 64  Completed   Hepatitis C Screening   Completed   HIV Screening  Completed   Zoster (Shingles) Vaccine  Completed   Hepatitis B Vaccine  Aged Out   HPV Vaccine  Aged Out   Meningitis B Vaccine  Aged Out       04/11/2024    3:14 PM  Advanced Directives  Does Patient Have a Medical Advance Directive? No  Would patient like information on creating a medical advance directive? No - Patient declined   Advance Care Planning is important because it: Ensures you receive medical care that aligns with your values, goals, and preferences. Provides guidance to your family and loved ones, reducing the emotional burden of decision-making during critical moments.  Vision: Annual vision screenings are recommended for early detection of glaucoma, cataracts, and diabetic retinopathy. These exams can also reveal signs of chronic conditions such as diabetes and high blood pressure.  Dental: Annual dental screenings help detect early signs of oral cancer, gum disease, and other conditions linked to overall health, including heart disease and diabetes.  Please see the attached documents for additional preventive care recommendations.

## 2024-04-11 NOTE — Progress Notes (Signed)
 Subjective:   Lauren David is a 63 y.o. who presents for a Medicare Wellness preventive visit.  As a reminder, Annual Wellness Visits don't include a physical exam, and some assessments may be limited, especially if this visit is performed virtually. We may recommend an in-person follow-up visit with your provider if needed.  Visit Complete: Virtual I connected with  Lauren David on 04/11/24 by a audio enabled telemedicine application and verified that I am speaking with the correct person using two identifiers.  Patient Location: Home  Provider Location: Home Office  I discussed the limitations of evaluation and management by telemedicine. The patient expressed understanding and agreed to proceed.  Vital Signs: Because this visit was a virtual/telehealth visit, some criteria may be missing or patient reported. Any vitals not documented were not able to be obtained and vitals that have been documented are patient reported.  VideoDeclined- This patient declined Librarian, academic. Therefore the visit was completed with audio only.  Persons Participating in Visit: Patient.  AWV David: No: Patient Medicare AWV David was not completed prior to this visit.  Cardiac Risk Factors include: advanced age (>64men, >68 women);dyslipidemia;hypertension;smoking/ tobacco exposure;sedentary lifestyle     Objective:    Today's Vitals   04/11/24 1514  Weight: 153 lb (69.4 kg)  Height: 5' 1 (1.549 m)  PainSc: 0-No pain   Body mass index is 28.91 kg/m.     04/11/2024    3:14 PM 09/17/2023   10:23 AM 04/09/2023   10:54 AM 04/08/2022   11:03 AM 08/09/2021    1:23 PM 05/21/2021    6:04 AM 05/17/2021    2:24 PM  Advanced Directives  Does Patient Have a Medical Advance Directive? No No No No No No No  Would patient like information on creating a medical advance directive? No - Patient declined No - Patient declined No - Patient declined No - Patient  declined Yes (MAU/Ambulatory/Procedural Areas - Information given) No - Patient declined No - Patient declined    Current Medications (verified) Outpatient Encounter Medications as of 04/11/2024  Medication Sig   acetaminophen  (TYLENOL ) 500 MG tablet Take 1,000 mg by mouth every 6 (six) hours as needed for moderate pain or headache.   albuterol  (PROVENTIL ) (2.5 MG/3ML) 0.083% nebulizer solution ONE VIAL BY NEBULIZATION EVERY 6 HOURS AS NEEDED FOR WHEEZING OR SHORTNESS OF BREATH. (Patient taking differently: Take 2.5 mg by nebulization every 6 (six) hours as needed for wheezing.)   albuterol  (VENTOLIN  HFA) 108 (90 Base) MCG/ACT inhaler Inhale 2 puffs into the lungs every 6 (six) hours as needed for wheezing or shortness of breath.   aspirin  EC 81 MG tablet Take 81 mg by mouth daily. Swallow whole.   blood glucose meter kit and supplies Dispense based on patient and insurance preference. Use up to four times daily as directed. (FOR ICD-10 E10.9, E11.9).   celecoxib  (CELEBREX ) 200 MG capsule Take 1 capsule (200 mg total) by mouth daily as needed for mild pain (pain score 1-3) or moderate pain (pain score 4-6).   empagliflozin  (JARDIANCE ) 10 MG TABS tablet Take 1 tablet (10 mg total) by mouth daily.   famotidine  (PEPCID ) 40 MG tablet Take 1 tablet (40 mg total) by mouth daily.   gabapentin  (NEURONTIN ) 100 MG capsule Take 2 capsules (200 mg total) by mouth at bedtime.   ketoconazole  (NIZORAL ) 2 % cream Apply 1 Application topically daily.   metoprolol  tartrate (LOPRESSOR ) 25 MG tablet Take 0.5 tablets (12.5 mg total)  by mouth 2 (two) times daily.   nitroGLYCERIN  (NITROSTAT ) 0.4 MG SL tablet Place 1 tablet (0.4 mg total) under the tongue every 5 (five) minutes x 3 doses as needed for chest pain (If no relief after 3rd dose, GO TO ED).   olmesartan  (BENICAR ) 20 MG tablet Take 1 tablet (20 mg total) by mouth daily.   rosuvastatin  (CRESTOR ) 40 MG tablet Take 1 tablet (40 mg total) by mouth daily.    tirzepatide  (MOUNJARO ) 10 MG/0.5ML Pen Inject 10 mg into the skin once a week.   umeclidinium bromide  (INCRUSE ELLIPTA ) 62.5 MCG/ACT AEPB Inhale 1 puff into the lungs daily.   No facility-administered encounter medications on file as of 04/11/2024.    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   Chronic back pain    COPD (chronic obstructive pulmonary disease) (HCC)    Depression    Essential hypertension    Headache    Hyperlipidemia    Myocardial infarction (HCC)    Pneumonia    Type 2 diabetes mellitus (HCC)    Past Surgical History:  Procedure Laterality Date   BREAST BIOPSY Left 2016   fibroadenoma with calcifications and fibrocystic changes   COLONOSCOPY N/A 04/26/2018   Procedure: COLONOSCOPY;  Surgeon: Golda Claudis PENNER, MD;  Location: [facility];  Service: Endoscopy;  Laterality: N/A;  1:15   CORONARY ARTERY BYPASS GRAFT N/A 05/21/2021   Procedure: CORONARY ARTERY BYPASS GRAFTING (CABG) X4 ON PUMP USING LEFT INTERNAL MAMMARY ARTERY AND ENDOSCOPICALLY HARVESTED RIGHT GREATER SAPHENOUS VEIN;  Surgeon: Shyrl Linnie KIDD, MD;  Location: [facility] OR;  Service: Open Heart Surgery;  Laterality: N/A;   CORONARY PRESSURE/FFR STUDY N/A 05/12/2018   Procedure: INTRAVASCULAR PRESSURE WIRE/FFR STUDY;  Surgeon: Mady Bruckner, MD;  Location: [facility];  Service: Cardiovascular;  Laterality: N/A;   CORONARY PRESSURE/FFR STUDY N/A 04/22/2021   Procedure: INTRAVASCULAR PRESSURE WIRE/FFR STUDY;  Surgeon: Mady Bruckner, MD;  Location: [facility];  Service: Cardiovascular;  Laterality: N/A;   ENDOVEIN HARVEST OF GREATER SAPHENOUS VEIN Right 05/21/2021   Procedure: ENDOVEIN HARVEST OF GREATER SAPHENOUS VEIN;  Surgeon: Shyrl Linnie KIDD, MD;  Location: [facility];  Service: Open Heart Surgery;  Laterality:  Right;   LEFT HEART CATH AND CORONARY ANGIOGRAPHY N/A 05/12/2018   Procedure: LEFT HEART CATH AND CORONARY ANGIOGRAPHY;  Surgeon: Mady Bruckner, MD;  Location: [facility];  Service: Cardiovascular;  Laterality: N/A;   LEFT HEART CATH AND CORONARY ANGIOGRAPHY N/A 04/22/2021   Procedure: LEFT HEART CATH AND CORONARY ANGIOGRAPHY;  Surgeon: Mady Bruckner, MD;  Location: [facility];  Service: Cardiovascular;  Laterality: N/A;   POLYPECTOMY  04/26/2018   Procedure: POLYPECTOMY;  Surgeon: Golda Claudis PENNER, MD;  Location: [facility];  Service: Endoscopy;;  colon    RADIAL ARTERY HARVEST Left 05/21/2021   Procedure: RADIAL ARTERY HARVEST;  Surgeon: Shyrl Linnie KIDD, MD;  Location: [facility];  Service: Open Heart Surgery;  Laterality: Left;   TEE WITHOUT CARDIOVERSION N/A 05/21/2021   Procedure: TRANSESOPHAGEAL ECHOCARDIOGRAM (TEE);  Surgeon: Shyrl Linnie KIDD, MD;  Location: [facility] OR;  Service: Open Heart Surgery;  Laterality: N/A;   TOTAL VAGINAL HYSTERECTOMY     Family History  Problem Relation Age of Onset   Stroke Father    Hypertension Father    Breast  cancer Paternal Aunt    Breast cancer Paternal Aunt    Breast cancer Paternal Grandmother    Social History   Socioeconomic History   Marital status: Married    Spouse name: Not on file   Number of children: Not on file   Years of education: Not on file   Highest education level: Not on file  Occupational History   Not on file  Tobacco Use   Smoking status: Every Day    Current packs/day: 1.00    Average packs/day: 1 pack/day for 40.0 years (40.0 ttl pk-yrs)    Types: Cigarettes    Passive exposure: Never   Smokeless tobacco: Never   Tobacco comments:    cut back to 1/2 pack a day. Smokig x 57yrs. Did smoke 2 packs x 5 yrs, but cut back a month ago.   Vaping Use   Vaping status: Never Used  Substance and Sexual Activity   Alcohol use: Never   Drug use: Never   Sexual activity: Not on file  Other  Topics Concern   Not on file  Social History Narrative   Not on file   Social Drivers of Health   Financial Resource Strain: Low Risk  (04/11/2024)   Overall Financial Resource Strain (CARDIA)    Difficulty of Paying Living Expenses: Not hard at all  Food Insecurity: No Food Insecurity (04/11/2024)   Hunger Vital Sign    Worried About Running Out of Food in the Last Year: Never true    Ran Out of Food in the Last Year: Never true  Transportation Needs: No Transportation Needs (04/11/2024)   PRAPARE - Administrator, Civil Service (Medical): No    Lack of Transportation (Non-Medical): No  Physical Activity: Inactive (04/11/2024)   Exercise Vital Sign    Days of Exercise per Week: 0 days    Minutes of Exercise per Session: 0 min  Stress: No Stress Concern Present (04/11/2024)   Lauren David    Feeling of Stress: Not at all  Social Connections: Moderately Isolated (04/11/2024)   Social Connection and Isolation Panel    Frequency of Communication with Friends and Family: More than three times a week    Frequency of Social Gatherings with Friends and Family: More than three times a week    Attends Religious Services: More than 4 times per year    Active Member of [employer] or Organizations: No    Attends Banker Meetings: Never    Marital Status: Separated    Tobacco Counseling Ready to quit: Yes Counseling given: Yes Tobacco comments: cut back to 1/2 pack a day. Smokig x 75yrs. Did smoke 2 packs x 5 yrs, but cut back a month ago.     Clinical Intake:  Pre-visit preparation completed: Yes  Pain : No/denies pain Pain Score: 0-No pain     BMI - recorded: 28.91 Nutritional Status: BMI 25 -29 Overweight Nutritional Risks: None Diabetes: Yes CBG done?: No (telehealth visit.) Did pt. bring in CBG monitor from home?: No  Lab Results  Component Value Date   HGBA1C 5.8 (H) 12/17/2023   HGBA1C 5.4  08/20/2023   HGBA1C 6.5 (H) 05/13/2023     How often do you need to have someone help you when you read instructions, pamphlets, or other written materials from your doctor or pharmacy?: 1 - Never  Interpreter Needed?: No  Information entered by :: Lauren Ungerer W CMA (AAMA)   Activities  of Daily Living     04/11/2024    3:21 PM  In your present state of health, do you have any difficulty performing the following activities:  Hearing? 0  Vision? 0  Difficulty concentrating or making decisions? 0  Walking or climbing stairs? 0  Dressing or bathing? 0  Doing errands, shopping? 0  Preparing Food and eating ? N  Using the Toilet? N  In the past six months, have you accidently leaked urine? N  Do you have problems with loss of bowel control? N  Managing your Medications? N  Managing your Finances? N  Housekeeping or managing your Housekeeping? N    Patient Care Team: Tobie Suzzane POUR, MD as PCP - General (Internal Medicine) Dr [facility], OD Debera Jayson MATSU, MD as Consulting Physician (Cardiology) Margrette Taft BRAVO, MD as Consulting Physician (Orthopedic Surgery)  I have updated your Care Teams any recent Medical Services you may have received from other providers in the past year.     Assessment:   This is a routine wellness examination for Lauren David.  Hearing/Vision screen Hearing Screening - Comments:: Patient denies any hearing difficulties.   Vision Screening - Comments:: Wears rx glasses - up to date with routine eye exams with  Willma Moats in [place]   Goals Addressed             This Visit's Progress    Patient Stated   On track    She would like to be more compliant with healthcare      Patient Stated   On track    Manage my blood sugar better and comply with my medications and treatment plan       Depression Screen     04/11/2024    3:25 PM 03/23/2024   11:14 AM 12/22/2023   10:50 AM 12/22/2023   10:32 AM 08/20/2023   11:12 AM  05/20/2023   10:59 AM 04/29/2023   11:19 AM  PHQ 2/9 Scores  PHQ - 2 Score 0 0 5 0 0 0 0  PHQ- 9 Score 0 0 7 0 0 0     Fall Risk     04/11/2024    3:20 PM 03/23/2024   11:14 AM 12/22/2023   10:32 AM 08/20/2023   11:12 AM 05/20/2023   10:59 AM  Fall Risk   Falls in the past year? 0 0 0 0 1  Number falls in past yr: 0 0 0 0 0  Injury with Fall? 0 0 0 0 0  Risk for fall due to : No Fall Risks No Fall Risks No Fall Risks No Fall Risks No Fall Risks;Other (Comment)  Risk for fall due to: Comment     age  Follow up Falls evaluation completed;Education provided;Falls prevention discussed Falls evaluation completed Falls evaluation completed Falls evaluation completed Falls evaluation completed    MEDICARE RISK AT HOME:  Medicare Risk at Home Any stairs in or around the home?: Yes If so, are there any without handrails?: No Home free of loose throw rugs in walkways, pet beds, electrical cords, etc?: Yes Adequate lighting in your home to reduce risk of falls?: Yes Life alert?: No Use of a cane, walker or w/c?: No Grab bars in the bathroom?: Yes Shower chair or bench in shower?: Yes Elevated toilet seat or a handicapped toilet?: No  TIMED UP AND GO:  Was the test performed?  No  Cognitive Function: 6CIT completed    03/11/2021   11:01 AM  MMSE - Mini Mental State Exam  Not completed: Unable to complete        04/11/2024    3:24 PM 04/09/2023   10:59 AM 04/08/2022   11:06 AM 04/08/2022   11:04 AM 03/11/2021   11:01 AM  6CIT Screen  What Year? 0 points 0 points 0 points 0 points 0 points  What month? 0 points 0 points 0 points 0 points 0 points  What time? 0 points 0 points 0 points  0 points  Count back from 20 0 points 0 points 0 points  0 points  Months in reverse 0 points 0 points 0 points  0 points  Repeat phrase 0 points 0 points 0 points  0 points  Total Score 0 points 0 points 0 points  0 points    Immunizations Immunization History  Administered Date(s) Administered    Influenza, Seasonal, Injecte, Preservative Fre 04/29/2023   Influenza,inj,Quad PF,6+ Mos 09/10/2020   Moderna SARS-COV2 Booster Vaccination 05/09/2021   Moderna Sars-Covid-2 Vaccination 08/10/2019, 10/20/2019, 11/18/2019   PFIZER(Purple Top)SARS-COV-2 Vaccination 10/20/2019   PNEUMOCOCCAL CONJUGATE-20 04/29/2023   Zoster Recombinant(Shingrix) 04/21/2023, 09/04/2023    Screening Tests Health Maintenance  Topic Date Due   DTaP/Tdap/Td (1 - Tdap) Never done   Influenza Vaccine  03/04/2024   COVID-19 Vaccine (5 - 2025-26 season) 04/04/2024   Lung Cancer Screening  05/12/2024   HEMOGLOBIN A1C  06/18/2024   MAMMOGRAM  07/13/2024   FOOT EXAM  08/19/2024   OPHTHALMOLOGY EXAM  03/09/2025   Diabetic kidney evaluation - eGFR measurement  03/17/2025   Diabetic kidney evaluation - Urine ACR  03/23/2025   Medicare Annual Wellness (AWV)  04/11/2025   Colonoscopy  04/26/2025   Pneumococcal Vaccine: 50+ Years  Completed   Hepatitis C Screening  Completed   HIV Screening  Completed   Zoster Vaccines- Shingrix  Completed   Hepatitis B Vaccines 19-59 Average Risk  Aged Out   HPV VACCINES  Aged Out   Meningococcal B Vaccine  Aged Out    Health Maintenance  Health Maintenance Due  Topic Date Due   DTaP/Tdap/Td (1 - Tdap) Never done   Influenza Vaccine  03/04/2024   COVID-19 Vaccine (5 - 2025-26 season) 04/04/2024   Health Maintenance Items Addressed: Mammogram ordered Patient scheduled to get flu vaccine at [facility]  Additional Screening:  Vision Screening: Recommended annual ophthalmology exams for early detection of glaucoma and other disorders of the eye. Would you like a referral to an eye doctor? No    Dental Screening: Recommended annual dental exams for proper oral hygiene  Community Resource Referral / Chronic Care Management: CRR required this visit?  No   CCM required this visit?  No   Plan:    I have personally reviewed and noted the following in the patient's chart:    Medical and social history Use of alcohol, tobacco or illicit drugs  Current medications and supplements including opioid prescriptions. Patient is not currently taking opioid prescriptions. Functional ability and status Nutritional status Physical activity Advanced directives List of other physicians Hospitalizations, surgeries, and ER visits in previous 12 months Vitals Screenings to include cognitive, depression, and falls Referrals and appointments  In addition, I have reviewed and discussed with patient certain preventive protocols, quality metrics, and best practice recommendations. A written personalized care plan for preventive services as well as general preventive health recommendations were provided to patient.   Jenean Escandon, CMA   04/11/2024   After Visit Summary: (MyChart) Due to this being  a telephonic visit, the after visit summary with patients personalized plan was offered to patient via MyChart   Notes: Nothing significant to report at this time.

## 2024-05-23 ENCOUNTER — Ambulatory Visit

## 2024-06-01 ENCOUNTER — Encounter: Payer: Self-pay | Admitting: Internal Medicine

## 2024-06-01 DIAGNOSIS — E1149 Type 2 diabetes mellitus with other diabetic neurological complication: Secondary | ICD-10-CM | POA: Diagnosis not present

## 2024-06-03 LAB — BAYER DCA HB A1C WAIVED: HB A1C (BAYER DCA - WAIVED): 5.8 % — ABNORMAL HIGH (ref 4.8–5.6)

## 2024-06-06 ENCOUNTER — Other Ambulatory Visit: Payer: Self-pay | Admitting: Internal Medicine

## 2024-06-06 DIAGNOSIS — E1151 Type 2 diabetes mellitus with diabetic peripheral angiopathy without gangrene: Secondary | ICD-10-CM

## 2024-09-01 ENCOUNTER — Ambulatory Visit: Admitting: Internal Medicine

## 2024-09-26 ENCOUNTER — Ambulatory Visit: Admitting: Cardiology

## 2024-10-05 ENCOUNTER — Ambulatory Visit: Payer: Self-pay | Admitting: Internal Medicine

## 2025-04-17 ENCOUNTER — Ambulatory Visit
